# Patient Record
Sex: Female | Born: 1950 | Race: White | Hispanic: No | Marital: Single | Smoking: Never smoker
Health system: Southern US, Community
[De-identification: ages and names within clinical notes are randomized; demographics above are authoritative.]

## PROBLEM LIST (undated history)

## (undated) DIAGNOSIS — E039 Hypothyroidism, unspecified: Secondary | ICD-10-CM

## (undated) DIAGNOSIS — G5600 Carpal tunnel syndrome, unspecified upper limb: Secondary | ICD-10-CM

## (undated) DIAGNOSIS — K219 Gastro-esophageal reflux disease without esophagitis: Secondary | ICD-10-CM

## (undated) DIAGNOSIS — H9190 Unspecified hearing loss, unspecified ear: Secondary | ICD-10-CM

## (undated) DIAGNOSIS — I8390 Asymptomatic varicose veins of unspecified lower extremity: Secondary | ICD-10-CM

## (undated) DIAGNOSIS — J189 Pneumonia, unspecified organism: Secondary | ICD-10-CM

## (undated) DIAGNOSIS — M1990 Unspecified osteoarthritis, unspecified site: Secondary | ICD-10-CM

## (undated) HISTORY — PX: FOOT SURGERY: SHX648

## (undated) HISTORY — PX: BACK SURGERY: SHX140

## (undated) HISTORY — PX: TONSILLECTOMY: SUR1361

## (undated) HISTORY — PX: APPENDECTOMY: SHX54

## (undated) HISTORY — PX: SPINAL FUSION: SHX223

## (undated) HISTORY — DX: Gastro-esophageal reflux disease without esophagitis: K21.9

## (undated) HISTORY — PX: COLON SURGERY: SHX602

## (undated) HISTORY — PX: KNEE ARTHROSCOPY: SUR90

## (undated) HISTORY — PX: ABDOMINAL HYSTERECTOMY: SHX81

## (undated) HISTORY — PX: OTHER SURGICAL HISTORY: SHX169

## (undated) HISTORY — DX: Hypothyroidism, unspecified: E03.9

---

## 2000-11-30 ENCOUNTER — Other Ambulatory Visit: Admission: RE | Admit: 2000-11-30 | Discharge: 2000-11-30 | Payer: Self-pay | Admitting: *Deleted

## 2003-03-03 ENCOUNTER — Ambulatory Visit (HOSPITAL_COMMUNITY): Admission: RE | Admit: 2003-03-03 | Discharge: 2003-03-03 | Payer: Self-pay | Admitting: Gastroenterology

## 2003-03-03 ENCOUNTER — Encounter (INDEPENDENT_AMBULATORY_CARE_PROVIDER_SITE_OTHER): Payer: Self-pay | Admitting: Specialist

## 2003-07-01 ENCOUNTER — Encounter: Admission: RE | Admit: 2003-07-01 | Discharge: 2003-07-01 | Payer: Self-pay | Admitting: Internal Medicine

## 2004-03-04 ENCOUNTER — Encounter: Admission: RE | Admit: 2004-03-04 | Discharge: 2004-03-04 | Payer: Self-pay | Admitting: Orthopedic Surgery

## 2004-07-29 ENCOUNTER — Inpatient Hospital Stay (HOSPITAL_COMMUNITY): Admission: EM | Admit: 2004-07-29 | Discharge: 2004-07-30 | Payer: Self-pay | Admitting: Emergency Medicine

## 2008-10-20 ENCOUNTER — Encounter: Admission: RE | Admit: 2008-10-20 | Discharge: 2008-10-20 | Payer: Self-pay | Admitting: Otolaryngology

## 2010-06-04 NOTE — Op Note (Signed)
NAME:  [name], [name]                      ACCOUNT NO.:  [id number]   MEDICAL RECORD NO.:  [id number]                   PATIENT TYPE:  AMB   LOCATION:  ENDO                                 FACILITY:  [facility]   PHYSICIAN:  Danise Edge, M.D.                DATE OF BIRTH:  10-10-1950   DATE OF PROCEDURE:  03/03/2003  DATE OF DISCHARGE:                                 OPERATIVE REPORT   PROCEDURE PERFORMED:  Colonoscopy.   ENDOSCOPIST:  Charolett Bumpers, M.D.   INDICATIONS FOR PROCEDURE:  [name] is a 60 year old female born  Sep 24, 1950.  [name] is scheduled to undergo a screening  colonoscopy with polypectomy to present colon cancer.  Approximately 12  years ago while residing in [place], [name] underwent surgery for  suspected Crohn's disease.  Her appendix and part of her colon were  resected.  She has had no symptoms to suggest Crohn's disease subsequently.   PREMEDICATION:  Versed 10 mg, Demerol 50 mg.   INSTRUMENT USED:  Pediatric Olympus video colonoscope.   DESCRIPTION OF PROCEDURE:  After obtaining informed consent, [name]  was placed in the left lateral decubitus position.  I administered  intravenous Demerol and intravenous Versed to achieve conscious sedation for  the procedure.  The patient's blood pressure, oxygen saturations and cardiac  rhythm were monitored throughout the procedure and documented in the medical  record.   Anal inspection was normal.  Digital rectal exam was normal.  The pediatric  adjustable Olympus video colonoscope was introduced into the rectum and  advanced to the end-to-end ileobicolonic surgical anastomosis.  Colonic  preparation for the exam today was excellent.   Rectum:  Normal.   Sigmoid colon and descending colon:  Normal.   Splenic flexure:  Normal.   Transverse colon:  Normal.   Hepatic flexure:  Normal.   Ascending colon:  Normal.   End-to-end ileo right colonic surgical  anastomosis:  On the colonic side of  the anastomosis there are scattered superficial linear ulcers with friable  appearing mucosa.  Visible suture is present associated with the  anastomosis.  Colonic biopsies were performed.  The anastomosis was scarred  to the point that I was unable to advance the pediatric colonoscope into the  distal ileum.  There appeared to be a small ulcer in the distal ileum which  I could see peering through the anastomosis.  Ileal biopsies were also  performed.   ASSESSMENT:  1. Normal screening proctocolonoscopy to the end-to-end surgical ileo right     colonic surgical anastomosis. No endoscopic evidence for the presence of     colorectal neoplasia.  2. The ileo right colon surgical anastomosis appeared inflamed on both the     colonic side and ileal sides of the anastomosis.  Colonic  biopsies were     performed and distal ileal biopsies were performed.   PLAN:  Danise Edge, M.D.    MJ/MEDQ  D:  03/03/2003  T:  03/03/2003  Job:  [id number]

## 2010-06-04 NOTE — H&P (Signed)
[name], [name]            ACCOUNT NO.:  [id number]   MEDICAL RECORD NO.:  [id number]          PATIENT TYPE:  INP   LOCATION:  6703                         FACILITY:  [facility]   PHYSICIAN:  Georgann Housekeeper, MD      DATE OF BIRTH:  12-May-1950   DATE OF ADMISSION:  07/28/2004  DATE OF DISCHARGE:                                HISTORY & PHYSICAL   HISTORY OF PRESENT ILLNESS:  The patient is a 60 year old white female with  past medical history significant for hypothyroidism. The patient presented  to the emergency room with abdominal pain x1 day after a meal, with nausea,  vomiting. The pain was mostly periumbilical. She had no fever, she had some  chills. The patient stated that she had a colonic obstruction 12 years ago  resulting in partial colectomy and appendectomy. Since then no problems. No  history of prior abdominal surgery except for an abdominal hysterectomy 8  years ago.   PAST MEDICAL HISTORY:  1.  Significant for hypothyroidism and question of thyroid nodule in March,      2006.  2.  Total abdominal hysterectomy.  3.  Hot flashes.  4.  Partial colectomy 12 years ago.  5.  In February, 2005 she had a colonoscopy that showed inflamed right      colonic anastomosis at either end of the anastomotic site, done by Dr.      Laural Benes.   FAMILY HISTORY:  Mother with CHF, asthma, 62 years old. Father with coronary  artery disease and MI at 64 years of age. Three sisters, two brothers are  healthy.   SOCIAL HISTORY:  She does not smoke. She drinks socially. She owns her own  business. She has three children.   MEDICATIONS:  1.  Synthroid.  2.  Premarin.  3.  Multivitamin.   ALLERGIES:  No known drug allergies.   REVIEW OF SYSTEMS:  No chest pain, no shortness of breath. Positive for  abdominal pain. No dysuria. Right hand pain. No recent constipation.   PHYSICAL EXAMINATION:  VITAL SIGNS:  Temperature 98, pulse 83, respiration  20, blood pressure 106/64.  HEENT:   Normocephalic, atraumatic. Pupils are equal, round and reactive to  light. Throat without erythema.  CARDIOVASCULAR:  Regular rate and rhythm, no murmurs, rubs, or gallops.  LUNGS:  Clear to auscultation bilaterally. No wheezes, rhonchi or rales.  ABDOMEN:  Soft with some mild periumbilical tenderness. Positive bowel  sounds.  EXTREMITIES:  Without edema.  RECTAL EXAM:  She had good rectal tone. No stool in the rectal vault.   LABORATORY DATA:  CT scan showed a question of partial small bowel  obstruction, abnormal thick wall loops of small bowel distally, question of  ischemia. White blood cell count 5700, hemoglobin 12.3, platelets 211,000.  Sodium 130, potassium 3.4, chloride 112, CO2 22, BUN 5, creatinine 0.8,  glucose 103. LDH 122, lipase 20, PT 24, PTT 12.8, INR 0.1. Urinalysis  negative.   ASSESSMENT/PLAN:  Question of partial small bowel obstruction. The patient  has a prior history of colonic obstruction, had surgery. She has no history  of recent constipation. Her abdominal  exam is presently benign. She does not  have any abdominal pain. Some mild tenderness in the periumbilical area.  Based on her physical exam I doubt she has severe obstruction at present, or  that she has ischemia, but will give her IV fluids, make her n.p.o. except  for ice chips, and continue to monitor. Will defer to Dr. Georgann Housekeeper to  be seen in the morning to see if he wants to get a surgical consult. Will do  a soapsuds enema today although I could not really feel any form of  obstruction in the rectal vault.   1.  Hypokalemia. A repeat potassium and check her magnesium.   1.  Hypothyroidism. Will check TSH and hold Synthroid for now.       NJ/MEDQ  D:  07/29/2004  T:  07/29/2004  Job:  [id number]

## 2010-06-04 NOTE — Consult Note (Signed)
[name], [name]            ACCOUNT NO.:  [id number]   MEDICAL RECORD NO.:  [id number]          PATIENT TYPE:  INP   LOCATION:  6703                         FACILITY:  [facility]   PHYSICIAN:  James L. Malon Kindle., M.D.DATE OF BIRTH:  1950-12-05   DATE OF CONSULTATION:  07/29/2004  DATE OF DISCHARGE:                                   CONSULTATION   REFERRING PHYSICIAN:  Dr. Georgann Housekeeper   HISTORY:  [name] is a very nice 60 year old woman whom has a history  of what was felt to be Crohn's disease back in [place] 12 years ago.  She  at that time had a partial colectomy and appendectomy and small bowel  resection according to her and was told that this was not Crohn's disease  after all.  She has done well since then with no chronic diarrhea, no other  problems.  She had a colonoscopy by Dr. Danise Edge last year and the  patient was found to have an end-to-end ileocolonic anastomosis.  On the  colonic side were superficial linear ulcers with friable mucosa.  This was  biopsied and the biopsies came back showing chronic active inflammation  consistent with inflammatory bowel disease.  The patient has had no symptoms  clearly suggestive of inflammatory bowel disease and does not feel that she  has that.  She came in acutely, was doing well until one day prior to  admission and began to have abdominal pain after a meal with nausea and  vomiting, periumbilical cramping pain.  She had an acute abdominal series  shows a nonspecific bowel gas pattern, ileus versus partial small bowel  obstruction.  CT showed a thickened small bowel consistent with a partial  small bowel obstruction.  They were unable to call anything else other than  that.  There was no clear Crohn's disease on the CT.  Patient clinically  feels better, although she says her abdomen feels a little touchy.   CHRONIC MEDICATIONS:  Synthroid, Premarin, vitamins.   She has no drug allergies.   MEDICAL  HISTORY:  1.  Hypothyroidism.  2.  History of right colectomy and ileal resection 12 years ago with      subsequent colonoscopy showing inflammation, the questionable      inflammatory bowel disease unanswered.  3.  Status post total abdominal hysterectomy.   FAMILY HISTORY:  Heart failure, asthma, heart disease.  No inflammatory  bowel disease.   REVIEW OF SYSTEMS:  Remarkable for lack of diarrhea or abdominal pain on a  chronic basis.   PHYSICAL EXAMINATION:  VITAL SIGNS:  Patient is afebrile.  Vital signs  normal.  GENERAL:  Pleasant white female.  She is in no obvious distress.  HEENT:  Non-icteric.  Extraocular movements intact.  NECK:  Supple.  No lymphadenopathy.  LUNGS:  Clear.  HEART:  Regular rate and rhythm without murmurs or gallops.  ABDOMEN:  Soft, nontender, nondistended with good bowel sounds.  RECTAL:  There was no stool in the rectal vault on physical examination.   ASSESSMENT:  Abnormal CT suggestive of partial small bowel obstruction.  This could  be adhesions or the patient could have Crohn's disease.  I think  going ahead with a small bowel series would certainly be appropriate.   PLAN:  Will start her on clear liquids and move ahead with a small bowel  series and have her follow up with Dr. Laural Benes.       JLE/MEDQ  D:  07/29/2004  T:  07/29/2004  Job:  [id number]   cc:   Danise Edge, M.D.  [address]  [place]  [place] [address]  Fax: [phone]

## 2010-06-04 NOTE — Discharge Summary (Signed)
[name], [name]            ACCOUNT NO.:  [id number]   MEDICAL RECORD NO.:  [id number]          PATIENT TYPE:  INP   LOCATION:  6703                         FACILITY:  [facility]   PHYSICIAN:  Georgann Housekeeper, MD      DATE OF BIRTH:  03-04-1950   DATE OF ADMISSION:  07/28/2004  DATE OF DISCHARGE:  07/30/2004                                 DISCHARGE SUMMARY   DISCHARGE MEDICATIONS:  Synthroid, Premarin, multivitamin.   DIET:  Bland diet.   FOLLOWUP:  Follow up with me in the office in one week.   CONSULTATIONS:  GI.   IMAGING PROCEDURES:  1.  Computerized tomography of abdomen and pelvis which showed some      questionable thickening of the small bowel area and partial small bowel      obstruction.  2.  Upper GI small bowel follow-through showed some narrowing at the      enterocolonic anastomosis area and partial small bowel obstruction.   LABORATORIES:  Normal white count.  Chemistries were normal.  LFTs were  normal.  Urine negative.  Thyroid was normal.   HOSPITAL COURSE:  Please see H&P for details.  A 60 year old female with a  history of hypothyroidism and had a remote history of a partial colectomy  for removal of the appendix and part of the terminal ileum, came in with  abdominal pain and nausea and vomiting consistent with partial small bowel  obstruction.  The patient was admitted, made n.p.o. and IV fluids were  started.  The patient was hemodynamically stable.  Lab work was all normal.  CT scan was obtained and showed some questionable thickening of the terminal  ileum and was thought to be around the anastomosis area, was all part of the  small bowel obstruction and thickening.  She did well with the n.p.o. and  __________ management, started on clear liquids and then advanced her diet,  tolerated it well without any problems.  Because of the CT findings, she had  an upper GI with small bowel follow-through per the GI.  It showed there was  some narrowing around  the area of the terminal ileum at the anastomosis  site, questionable mild ileitis.  The patient had had colonoscopy prior to  the admission by Dr. Laural Benes.  Because of the findings thought to be  questionable inflammatory bowel disease, but it was thought secondary to her  small bowel obstruction.  She had no abdominal pain, tolerated the diet  well, no bloody diarrhea, no fevers.  The patient is to follow up as an  outpatient.  She is to be continued on her home medications.   CONDITION ON DISCHARGE:  Stable.   FOLLOWUP:  Followup in one week.      Georgann Housekeeper, MD  Electronically Signed     KH/MEDQ  D:  09/24/2004  T:  09/24/2004  Job:  [id number]

## 2014-09-26 DIAGNOSIS — J069 Acute upper respiratory infection, unspecified: Secondary | ICD-10-CM | POA: Diagnosis not present

## 2014-10-03 DIAGNOSIS — J45909 Unspecified asthma, uncomplicated: Secondary | ICD-10-CM | POA: Diagnosis not present

## 2014-10-09 DIAGNOSIS — Z23 Encounter for immunization: Secondary | ICD-10-CM | POA: Diagnosis not present

## 2014-10-09 DIAGNOSIS — Z0001 Encounter for general adult medical examination with abnormal findings: Secondary | ICD-10-CM | POA: Diagnosis not present

## 2014-10-09 DIAGNOSIS — R739 Hyperglycemia, unspecified: Secondary | ICD-10-CM | POA: Diagnosis not present

## 2014-10-09 DIAGNOSIS — E785 Hyperlipidemia, unspecified: Secondary | ICD-10-CM | POA: Diagnosis not present

## 2014-10-09 DIAGNOSIS — R233 Spontaneous ecchymoses: Secondary | ICD-10-CM | POA: Diagnosis not present

## 2014-10-09 DIAGNOSIS — Z6835 Body mass index (BMI) 35.0-35.9, adult: Secondary | ICD-10-CM | POA: Diagnosis not present

## 2014-10-09 DIAGNOSIS — Z780 Asymptomatic menopausal state: Secondary | ICD-10-CM | POA: Diagnosis not present

## 2014-10-09 DIAGNOSIS — R21 Rash and other nonspecific skin eruption: Secondary | ICD-10-CM | POA: Diagnosis not present

## 2014-10-09 DIAGNOSIS — K219 Gastro-esophageal reflux disease without esophagitis: Secondary | ICD-10-CM | POA: Diagnosis not present

## 2014-10-09 DIAGNOSIS — E039 Hypothyroidism, unspecified: Secondary | ICD-10-CM | POA: Diagnosis not present

## 2014-10-09 DIAGNOSIS — J209 Acute bronchitis, unspecified: Secondary | ICD-10-CM | POA: Diagnosis not present

## 2014-10-09 DIAGNOSIS — Z79899 Other long term (current) drug therapy: Secondary | ICD-10-CM | POA: Diagnosis not present

## 2014-10-31 DIAGNOSIS — M1711 Unilateral primary osteoarthritis, right knee: Secondary | ICD-10-CM | POA: Diagnosis not present

## 2014-12-15 DIAGNOSIS — Z1231 Encounter for screening mammogram for malignant neoplasm of breast: Secondary | ICD-10-CM | POA: Diagnosis not present

## 2014-12-25 DIAGNOSIS — H900 Conductive hearing loss, bilateral: Secondary | ICD-10-CM | POA: Diagnosis not present

## 2014-12-25 DIAGNOSIS — Z9622 Myringotomy tube(s) status: Secondary | ICD-10-CM | POA: Diagnosis not present

## 2014-12-25 DIAGNOSIS — Z8669 Personal history of other diseases of the nervous system and sense organs: Secondary | ICD-10-CM | POA: Diagnosis not present

## 2014-12-25 DIAGNOSIS — H7201 Central perforation of tympanic membrane, right ear: Secondary | ICD-10-CM | POA: Diagnosis not present

## 2014-12-25 DIAGNOSIS — H9072 Mixed conductive and sensorineural hearing loss, unilateral, left ear, with unrestricted hearing on the contralateral side: Secondary | ICD-10-CM | POA: Diagnosis not present

## 2015-02-11 DIAGNOSIS — H40013 Open angle with borderline findings, low risk, bilateral: Secondary | ICD-10-CM | POA: Diagnosis not present

## 2015-02-11 DIAGNOSIS — H40053 Ocular hypertension, bilateral: Secondary | ICD-10-CM | POA: Diagnosis not present

## 2015-02-13 DIAGNOSIS — H7292 Unspecified perforation of tympanic membrane, left ear: Secondary | ICD-10-CM | POA: Diagnosis not present

## 2015-02-13 DIAGNOSIS — Z79899 Other long term (current) drug therapy: Secondary | ICD-10-CM | POA: Diagnosis not present

## 2015-02-13 DIAGNOSIS — H9012 Conductive hearing loss, unilateral, left ear, with unrestricted hearing on the contralateral side: Secondary | ICD-10-CM | POA: Diagnosis not present

## 2015-03-18 DIAGNOSIS — Z780 Asymptomatic menopausal state: Secondary | ICD-10-CM | POA: Diagnosis not present

## 2015-04-01 DIAGNOSIS — Z124 Encounter for screening for malignant neoplasm of cervix: Secondary | ICD-10-CM | POA: Diagnosis not present

## 2015-07-14 DIAGNOSIS — M1711 Unilateral primary osteoarthritis, right knee: Secondary | ICD-10-CM | POA: Diagnosis not present

## 2015-07-14 DIAGNOSIS — M25512 Pain in left shoulder: Secondary | ICD-10-CM | POA: Diagnosis not present

## 2015-07-15 DIAGNOSIS — M2011 Hallux valgus (acquired), right foot: Secondary | ICD-10-CM | POA: Diagnosis not present

## 2015-07-20 DIAGNOSIS — M1711 Unilateral primary osteoarthritis, right knee: Secondary | ICD-10-CM | POA: Diagnosis not present

## 2015-08-17 ENCOUNTER — Ambulatory Visit
Admission: RE | Admit: 2015-08-17 | Discharge: 2015-08-17 | Disposition: A | Discharge status: Home / Self Care | Payer: Medicare Other | Source: Ambulatory Visit | Attending: Nurse Practitioner | Admitting: Nurse Practitioner

## 2015-08-17 ENCOUNTER — Other Ambulatory Visit: Payer: Self-pay | Admitting: Nurse Practitioner

## 2015-08-17 DIAGNOSIS — M25561 Pain in right knee: Secondary | ICD-10-CM

## 2015-08-17 DIAGNOSIS — W19XXXA Unspecified fall, initial encounter: Secondary | ICD-10-CM | POA: Diagnosis not present

## 2015-08-17 DIAGNOSIS — T1490XA Injury, unspecified, initial encounter: Secondary | ICD-10-CM

## 2015-08-17 DIAGNOSIS — S8001XA Contusion of right knee, initial encounter: Secondary | ICD-10-CM | POA: Diagnosis not present

## 2015-08-18 DIAGNOSIS — R635 Abnormal weight gain: Secondary | ICD-10-CM | POA: Diagnosis not present

## 2015-08-18 DIAGNOSIS — N951 Menopausal and female climacteric states: Secondary | ICD-10-CM | POA: Diagnosis not present

## 2015-09-29 DIAGNOSIS — H524 Presbyopia: Secondary | ICD-10-CM | POA: Diagnosis not present

## 2015-09-29 DIAGNOSIS — H5203 Hypermetropia, bilateral: Secondary | ICD-10-CM | POA: Diagnosis not present

## 2015-09-29 DIAGNOSIS — H40053 Ocular hypertension, bilateral: Secondary | ICD-10-CM | POA: Diagnosis not present

## 2015-09-29 DIAGNOSIS — H52221 Regular astigmatism, right eye: Secondary | ICD-10-CM | POA: Diagnosis not present

## 2015-09-29 DIAGNOSIS — H2513 Age-related nuclear cataract, bilateral: Secondary | ICD-10-CM | POA: Diagnosis not present

## 2015-10-27 DIAGNOSIS — M21611 Bunion of right foot: Secondary | ICD-10-CM | POA: Diagnosis not present

## 2015-10-27 DIAGNOSIS — M2061 Acquired deformities of toe(s), unspecified, right foot: Secondary | ICD-10-CM | POA: Diagnosis not present

## 2015-10-27 DIAGNOSIS — M205X1 Other deformities of toe(s) (acquired), right foot: Secondary | ICD-10-CM | POA: Diagnosis not present

## 2015-10-27 DIAGNOSIS — M2011 Hallux valgus (acquired), right foot: Secondary | ICD-10-CM | POA: Diagnosis not present

## 2015-11-03 ENCOUNTER — Inpatient Hospital Stay (INDEPENDENT_AMBULATORY_CARE_PROVIDER_SITE_OTHER): Payer: Medicare Other | Admitting: Orthopedic Surgery

## 2015-11-03 DIAGNOSIS — M1711 Unilateral primary osteoarthritis, right knee: Secondary | ICD-10-CM

## 2015-11-03 DIAGNOSIS — M2041 Other hammer toe(s) (acquired), right foot: Secondary | ICD-10-CM

## 2015-11-03 DIAGNOSIS — M2011 Hallux valgus (acquired), right foot: Secondary | ICD-10-CM

## 2015-11-03 DIAGNOSIS — M2042 Other hammer toe(s) (acquired), left foot: Secondary | ICD-10-CM

## 2015-11-03 DIAGNOSIS — M2022 Hallux rigidus, left foot: Secondary | ICD-10-CM

## 2015-11-03 DIAGNOSIS — M25512 Pain in left shoulder: Secondary | ICD-10-CM

## 2015-11-11 ENCOUNTER — Ambulatory Visit (INDEPENDENT_AMBULATORY_CARE_PROVIDER_SITE_OTHER): Payer: Medicare Other | Admitting: Family

## 2015-11-11 ENCOUNTER — Encounter (INDEPENDENT_AMBULATORY_CARE_PROVIDER_SITE_OTHER): Payer: Self-pay | Admitting: Family

## 2015-11-11 VITALS — Ht 62.5 in | Wt 192.0 lb

## 2015-11-11 DIAGNOSIS — Z9889 Other specified postprocedural states: Secondary | ICD-10-CM

## 2015-11-11 DIAGNOSIS — M205X1 Other deformities of toe(s) (acquired), right foot: Secondary | ICD-10-CM

## 2015-11-11 NOTE — Progress Notes (Signed)
   Post-Op Visit Note   Patient: [name]           Date of Birth: 10-29-1950           MRN: [id number] Visit Date: 11/11/2015 PCP: [facility]   Assessment & Plan:  Chief Complaint:  Chief Complaint  Patient presents with  . Right Foot - Routine Post Op   Visit Diagnoses: No diagnosis found.   Exam: Incisions well approximated with sutures. No drainage, erythema or sign of infection. Moderate swelling.   Plan: Will wear compression stocking daily. Sutures and pin removed today. Dry dressing applied. May touchdown weight bear in post op shoe. Continue daily wound care.   Follow-Up Instructions: Return in about 2 weeks (around 11/25/2015), or post op check.   Orders:  No orders of the defined types were placed in this encounter.  Meds ordered this encounter  Medications  . aspirin 81 MG chewable tablet    Sig: Chew 81 mg by mouth daily.     PMFS History: There are no active problems to display for this patient.  Past Medical History:  Diagnosis Date  . GERD (gastroesophageal reflux disease)   . Hypothyroid     History reviewed. No pertinent family history.  No past surgical history on file. Social History   Occupational History  . Not on file.   Social History Main Topics  . Smoking status: Never Smoker  . Smokeless tobacco: Not on file  . Alcohol use 0.6 oz/week    1 Glasses of wine per week     Comment: daily  . Drug use: Unknown  . Sexual activity: Not on file

## 2015-11-25 ENCOUNTER — Ambulatory Visit (INDEPENDENT_AMBULATORY_CARE_PROVIDER_SITE_OTHER): Payer: Medicare Other | Admitting: Family

## 2015-11-25 ENCOUNTER — Encounter (INDEPENDENT_AMBULATORY_CARE_PROVIDER_SITE_OTHER): Payer: Self-pay | Admitting: Family

## 2015-11-25 VITALS — Ht 62.0 in | Wt 192.0 lb

## 2015-11-25 DIAGNOSIS — M21611 Bunion of right foot: Secondary | ICD-10-CM

## 2015-11-25 DIAGNOSIS — M205X1 Other deformities of toe(s) (acquired), right foot: Secondary | ICD-10-CM

## 2015-11-25 NOTE — Progress Notes (Signed)
   Office Visit Note   Patient: [name]           Date of Birth: 1950-07-17           MRN: [id number] Visit Date: 11/25/2015              Requested by: [facility] [place], [place] [address] PCP: [facility]   Assessment & Plan: Visit Diagnoses:  1. Bunion, right foot   2. Acquired claw toe of right foot     Plan: Continue scar massage. May shower. May resume weight bearing in regular shoe wear. Advance activities as tolerated.  Follow-Up Instructions: Return if symptoms worsen or fail to improve.   Orders:  No orders of the defined types were placed in this encounter.  No orders of the defined types were placed in this encounter.     Procedures: No procedures performed   Clinical Data: No additional findings.   Subjective: Chief Complaint  Patient presents with  . Right Foot - Routine Post Op    10/27/15 right great aiken osteotomy weil 2nd 3rd and 4th    4 weeks s/p a right great toe Aiken osteotomy and weil 2nd, 3rd and 4th MT pt is full  Weight bearing in a post op shoe with  No questions or concerns. She does massage the area with vitamin E    Review of Systems  Constitutional: Negative for chills and fever.  All other systems reviewed and are negative.    Objective: Vital Signs: Ht 5\' 2"  (1.575 m)   Wt 192 lb (87.1 kg)   BMI 35.12 kg/m   Physical Exam  Ortho Exam  Well-healed. No keloiding. Good range of motion. Moderate swelling right foot. No pain. does have a steady gait.  Specialty Comments:  No specialty comments available.  Imaging: No results found.   PMFS History: There are no active problems to display for this patient.  Past Medical History:  Diagnosis Date  . GERD (gastroesophageal reflux disease)   . Hypothyroid     History reviewed. No pertinent family history.  History reviewed. No pertinent surgical history. Social History   Occupational History    . Not on file.   Social History Main Topics  . Smoking status: Never Smoker  . Smokeless tobacco: Not on file  . Alcohol use 0.6 oz/week    1 Glasses of wine per week     Comment: daily  . Drug use: Unknown  . Sexual activity: Not on file

## 2016-03-10 DIAGNOSIS — K219 Gastro-esophageal reflux disease without esophagitis: Secondary | ICD-10-CM | POA: Diagnosis not present

## 2016-03-10 DIAGNOSIS — H7291 Unspecified perforation of tympanic membrane, right ear: Secondary | ICD-10-CM | POA: Diagnosis not present

## 2016-03-10 DIAGNOSIS — J040 Acute laryngitis: Secondary | ICD-10-CM | POA: Diagnosis not present

## 2016-04-28 ENCOUNTER — Ambulatory Visit (INDEPENDENT_AMBULATORY_CARE_PROVIDER_SITE_OTHER): Payer: Medicare Other | Admitting: Orthopedic Surgery

## 2016-04-28 ENCOUNTER — Encounter (INDEPENDENT_AMBULATORY_CARE_PROVIDER_SITE_OTHER): Payer: Self-pay | Admitting: Orthopedic Surgery

## 2016-04-28 DIAGNOSIS — G8929 Other chronic pain: Secondary | ICD-10-CM

## 2016-04-28 DIAGNOSIS — M25561 Pain in right knee: Secondary | ICD-10-CM

## 2016-04-28 MED ORDER — LIDOCAINE HCL 1 % IJ SOLN
5.0000 mL | INTRAMUSCULAR | Status: AC | PRN
  Administered 2016-04-28: 5 mL

## 2016-04-28 MED ORDER — METHYLPREDNISOLONE ACETATE 40 MG/ML IJ SUSP
40.0000 mg | INTRAMUSCULAR | Status: AC | PRN
  Administered 2016-04-28: 40 mg via INTRA_ARTICULAR

## 2016-04-28 MED ORDER — BUPIVACAINE HCL 0.25 % IJ SOLN
4.0000 mL | INTRAMUSCULAR | Status: AC | PRN
  Administered 2016-04-28: 4 mL via INTRA_ARTICULAR

## 2016-04-28 NOTE — Progress Notes (Signed)
Office Visit Note   Patient: [name]           Date of Birth: 1950/04/03           MRN: [id number] Visit Date: 04/28/2016 Requested by: [facility] [place], [place] [address] PCP: [facility]  Subjective: Chief Complaint  Patient presents with  . Right Knee - Pain    HPI: The is a 66 year old female with right knee arthritis.  Last knee injection performed July 2017.  She is getting ready to go to [facility] 2 beers her grandchildren.  She's having some recurrent symptoms.  Denies any frank mechanical symptoms but just reports pain.  She has been taking some over-the-counter medication.              ROS: All systems reviewed are negative as they relate to the chief complaint within the history of present illness.  Patient denies  fevers or chills.   Assessment & Plan: Visit Diagnoses:  1. Chronic pain of right knee     Plan: Impression is right knee exacerbation of arthritis plan is injection today.  Need for nonweightbearing quad strengthening exercises encouraged.  I'll see her back as needed  Follow-Up Instructions: Return if symptoms worsen or fail to improve.   Orders:  No orders of the defined types were placed in this encounter.  No orders of the defined types were placed in this encounter.     Procedures: Large Joint Inj Date/Time: 04/28/2016 10:36 PM Performed by: Meredith Pel Authorized by: Meredith Pel   Consent Given by:  Patient Site marked: the procedure site was marked   Timeout: prior to procedure the correct patient, procedure, and site was verified   Indications:  Pain, joint swelling and diagnostic evaluation Location:  Knee Site:  R knee Prep: patient was prepped and draped in usual sterile fashion   Needle Size:  18 G Needle Length:  1.5 inches Approach:  Superolateral Ultrasound Guidance: No   Fluoroscopic Guidance: No   Arthrogram: No   Medications:  5 mL  lidocaine 1 %; 4 mL bupivacaine 0.25 %; 40 mg methylPREDNISolone acetate 40 MG/ML Patient tolerance:  Patient tolerated the procedure well with no immediate complications     Clinical Data: No additional findings.  Objective: Vital Signs: There were no vitals taken for this visit.  Physical Exam:   Constitutional: Patient appears well-developed HEENT:  Head: Normocephalic Eyes:EOM are normal Neck: Normal range of motion Cardiovascular: Normal rate Pulmonary/chest: Effort normal Neurologic: Patient is alert Skin: Skin is warm Psychiatric: Patient has normal mood and affect    Ortho Exam: Orthopedic exam demonstrates pretty normal gait alignment no assistive devices palpable pedal pulses no effusion in the right knee there is some patellofemoral crepitus but stable collateral crucial ligaments intact extensor mechanism.  No other masses lymph adenopathy or skin changes noted in the right knee region  Specialty Comments:  No specialty comments available.  Imaging: No results found.   PMFS History: There are no active problems to display for this patient.  Past Medical History:  Diagnosis Date  . GERD (gastroesophageal reflux disease)   . Hypothyroid     No family history on file.  No past surgical history on file. Social History   Occupational History  . Not on file.   Social History Main Topics  . Smoking status: Never Smoker  . Smokeless tobacco: Not on file  . Alcohol  use 0.6 oz/week    1 Glasses of wine per week     Comment: daily  . Drug use: Unknown  . Sexual activity: Not on file

## 2016-04-29 DIAGNOSIS — E039 Hypothyroidism, unspecified: Secondary | ICD-10-CM | POA: Diagnosis not present

## 2016-04-29 DIAGNOSIS — E785 Hyperlipidemia, unspecified: Secondary | ICD-10-CM | POA: Diagnosis not present

## 2016-04-29 DIAGNOSIS — J45909 Unspecified asthma, uncomplicated: Secondary | ICD-10-CM | POA: Diagnosis not present

## 2016-07-01 DIAGNOSIS — L239 Allergic contact dermatitis, unspecified cause: Secondary | ICD-10-CM | POA: Diagnosis not present

## 2016-07-01 DIAGNOSIS — Z1389 Encounter for screening for other disorder: Secondary | ICD-10-CM | POA: Diagnosis not present

## 2016-07-01 DIAGNOSIS — H9012 Conductive hearing loss, unilateral, left ear, with unrestricted hearing on the contralateral side: Secondary | ICD-10-CM | POA: Diagnosis not present

## 2016-07-01 DIAGNOSIS — Z1211 Encounter for screening for malignant neoplasm of colon: Secondary | ICD-10-CM | POA: Diagnosis not present

## 2016-07-01 DIAGNOSIS — Z1231 Encounter for screening mammogram for malignant neoplasm of breast: Secondary | ICD-10-CM | POA: Diagnosis not present

## 2016-07-01 DIAGNOSIS — Z79899 Other long term (current) drug therapy: Secondary | ICD-10-CM | POA: Diagnosis not present

## 2016-07-01 DIAGNOSIS — H7102 Cholesteatoma of attic, left ear: Secondary | ICD-10-CM | POA: Diagnosis not present

## 2016-07-01 DIAGNOSIS — Z6834 Body mass index (BMI) 34.0-34.9, adult: Secondary | ICD-10-CM | POA: Diagnosis not present

## 2016-07-01 DIAGNOSIS — E039 Hypothyroidism, unspecified: Secondary | ICD-10-CM | POA: Diagnosis not present

## 2016-07-01 DIAGNOSIS — Z0000 Encounter for general adult medical examination without abnormal findings: Secondary | ICD-10-CM | POA: Diagnosis not present

## 2016-07-01 DIAGNOSIS — J45909 Unspecified asthma, uncomplicated: Secondary | ICD-10-CM | POA: Diagnosis not present

## 2016-07-01 DIAGNOSIS — Z23 Encounter for immunization: Secondary | ICD-10-CM | POA: Diagnosis not present

## 2016-07-01 DIAGNOSIS — E785 Hyperlipidemia, unspecified: Secondary | ICD-10-CM | POA: Diagnosis not present

## 2016-09-28 DIAGNOSIS — Z1211 Encounter for screening for malignant neoplasm of colon: Secondary | ICD-10-CM | POA: Diagnosis not present

## 2016-10-03 DIAGNOSIS — Z1211 Encounter for screening for malignant neoplasm of colon: Secondary | ICD-10-CM | POA: Diagnosis not present

## 2016-10-18 DIAGNOSIS — H40053 Ocular hypertension, bilateral: Secondary | ICD-10-CM | POA: Diagnosis not present

## 2016-10-18 DIAGNOSIS — H5203 Hypermetropia, bilateral: Secondary | ICD-10-CM | POA: Diagnosis not present

## 2016-10-18 DIAGNOSIS — H524 Presbyopia: Secondary | ICD-10-CM | POA: Diagnosis not present

## 2016-10-18 DIAGNOSIS — H52223 Regular astigmatism, bilateral: Secondary | ICD-10-CM | POA: Diagnosis not present

## 2016-11-02 DIAGNOSIS — M545 Low back pain: Secondary | ICD-10-CM | POA: Diagnosis not present

## 2016-11-02 DIAGNOSIS — M5136 Other intervertebral disc degeneration, lumbar region: Secondary | ICD-10-CM | POA: Diagnosis not present

## 2016-11-02 DIAGNOSIS — M4316 Spondylolisthesis, lumbar region: Secondary | ICD-10-CM | POA: Diagnosis not present

## 2016-11-03 DIAGNOSIS — Z23 Encounter for immunization: Secondary | ICD-10-CM | POA: Diagnosis not present

## 2016-11-04 DIAGNOSIS — H90A21 Sensorineural hearing loss, unilateral, right ear, with restricted hearing on the contralateral side: Secondary | ICD-10-CM | POA: Diagnosis not present

## 2016-11-04 DIAGNOSIS — Z79899 Other long term (current) drug therapy: Secondary | ICD-10-CM | POA: Diagnosis not present

## 2016-11-04 DIAGNOSIS — H90A32 Mixed conductive and sensorineural hearing loss, unilateral, left ear with restricted hearing on the contralateral side: Secondary | ICD-10-CM | POA: Diagnosis not present

## 2016-11-04 DIAGNOSIS — Z9889 Other specified postprocedural states: Secondary | ICD-10-CM | POA: Diagnosis not present

## 2016-11-04 DIAGNOSIS — Z45328 Encounter for adjustment and management of other implanted hearing device: Secondary | ICD-10-CM | POA: Diagnosis not present

## 2016-11-04 DIAGNOSIS — Z7982 Long term (current) use of aspirin: Secondary | ICD-10-CM | POA: Diagnosis not present

## 2016-11-09 ENCOUNTER — Encounter (INDEPENDENT_AMBULATORY_CARE_PROVIDER_SITE_OTHER): Payer: Self-pay | Admitting: Orthopedic Surgery

## 2016-11-09 ENCOUNTER — Ambulatory Visit (INDEPENDENT_AMBULATORY_CARE_PROVIDER_SITE_OTHER): Payer: Medicare Other | Admitting: Orthopedic Surgery

## 2016-11-09 DIAGNOSIS — M545 Low back pain: Secondary | ICD-10-CM | POA: Diagnosis not present
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
## 2016-11-15 ENCOUNTER — Ambulatory Visit
Admission: RE | Admit: 2016-11-15 | Discharge: 2016-11-15 | Disposition: A | Discharge status: Home / Self Care | Payer: Medicare Other | Source: Ambulatory Visit | Attending: Orthopedic Surgery | Admitting: Orthopedic Surgery

## 2016-11-15 DIAGNOSIS — M48061 Spinal stenosis, lumbar region without neurogenic claudication: Secondary | ICD-10-CM | POA: Diagnosis not present

## 2016-11-15 DIAGNOSIS — M545 Low back pain: Secondary | ICD-10-CM

## 2016-11-18 ENCOUNTER — Telehealth (INDEPENDENT_AMBULATORY_CARE_PROVIDER_SITE_OTHER): Payer: Self-pay | Admitting: Orthopedic Surgery

## 2016-11-18 NOTE — Telephone Encounter (Signed)
Ok to rf? IC LM for patient would be Monday before we knew if ok to rf medication.

## 2016-11-18 NOTE — Telephone Encounter (Signed)
Rx refill Hydrocodone 5-325mg 

## 2016-11-19 ENCOUNTER — Other Ambulatory Visit: Payer: Medicare Other

## 2016-11-21 ENCOUNTER — Encounter (INDEPENDENT_AMBULATORY_CARE_PROVIDER_SITE_OTHER): Payer: Self-pay | Admitting: Orthopedic Surgery

## 2016-11-21 ENCOUNTER — Ambulatory Visit (INDEPENDENT_AMBULATORY_CARE_PROVIDER_SITE_OTHER): Payer: Medicare Other | Admitting: Orthopedic Surgery

## 2016-11-21 DIAGNOSIS — M545 Low back pain: Secondary | ICD-10-CM

## 2016-11-21 DIAGNOSIS — G8929 Other chronic pain: Secondary | ICD-10-CM

## 2016-11-21 MED ORDER — HYDROCODONE-ACETAMINOPHEN 5-325 MG PO TABS: ORAL_TABLET | ORAL | 0 refills | Status: DC

## 2016-11-21 MED ORDER — IBUPROFEN 800 MG PO TABS: ORAL_TABLET | ORAL | 0 refills | Status: DC

## 2016-11-21 NOTE — Telephone Encounter (Signed)
IC advised could pick up after 1pm this afternoon.

## 2016-11-21 NOTE — Telephone Encounter (Signed)
y

## 2016-11-22 NOTE — Progress Notes (Signed)
Office Visit Note   Patient: [name]           Date of Birth: 1950/02/12           MRN: [id number] Visit Date: 11/21/2016 Requested by: [facility] [address] [place], [place] [address] PCP: [facility]  Subjective: Chief Complaint  Patient presents with  . Lower Back - Pain    HPI: [name] is a 66 year old patient with low back pain.  Since I have seen her she's had an MRI scan which shows severe stenosis at L4-5 left worse than right.  She also has 5 mm of anterolisthesis.  She's having some vaginal pain as well.  Some days are okay some days are not.  She's taking hydrocodone.  She took 2 axes for 9 days which helped.  She is here with her husband.              ROS: All systems reviewed are negative as they relate to the chief complaint within the history of present illness.  Patient denies  fevers or chills.   Assessment & Plan: Visit Diagnoses:  1. Chronic low back pain, unspecified back pain laterality, with sciatica presence unspecified     Plan: Impression is pretty severe spinal stenosis which I think is likely accounting for some of her vaginal pain as well as leg pain.  Nerve roots are fairly compressed at L4-5.  She wants to try all nonoperative options first.  I think she is heading for surgery.  Ibuprofen prescribed.  Refer to Dr. Ernestina Patches for L-spine ESI.  She should follow up with Dr. Lorin Mercy after that injection for surgical evaluation.  Follow-up with me as needed.  Follow-Up Instructions: No Follow-up on file.   Orders:  Orders Placed This Encounter  Procedures  . Ambulatory referral to Physical Medicine Rehab   Meds ordered this encounter  Medications  . ibuprofen (ADVIL,MOTRIN) 800 MG tablet    Sig: 1 po q d prn pain    Dispense:  60 tablet    Refill:  0      Procedures: No procedures performed   Clinical Data: No additional findings.  Objective: Vital Signs: There were no  vitals taken for this visit.  Physical Exam:   Constitutional: Patient appears well-developed HEENT:  Head: Normocephalic Eyes:EOM are normal Neck: Normal range of motion Cardiovascular: Normal rate Pulmonary/chest: Effort normal Neurologic: Patient is alert Skin: Skin is warm Psychiatric: Patient has normal mood and affect    Ortho Exam: Orthopedic exam demonstrates good ankle dorsiflexion plantar flexion quite hamstring strength no groin pain with internal/external rotation of the leg.  Reflexes symmetric.  No saddle paresthesias present.  No trochanteric tenderness is noted.  Rest of her examination is unchanged from last visit  Specialty Comments:  No specialty comments available.  Imaging: No results found.   PMFS History: There are no active problems to display for this patient.  Past Medical History:  Diagnosis Date  . GERD (gastroesophageal reflux disease)   . Hypothyroid     No family history on file.  No past surgical history on file. Social History   Occupational History  . Not on file  Tobacco Use  . Smoking status: Never Smoker  . Smokeless tobacco: Never Used  Substance and Sexual Activity  . Alcohol use: Yes    Alcohol/week: 0.6 oz    Types: 1 Glasses of wine per week    Comment: daily  .  Drug use: Not on file  . Sexual activity: Not on file

## 2016-11-30 ENCOUNTER — Ambulatory Visit (INDEPENDENT_AMBULATORY_CARE_PROVIDER_SITE_OTHER): Payer: Medicare Other | Admitting: Orthopedic Surgery

## 2016-12-01 ENCOUNTER — Ambulatory Visit (INDEPENDENT_AMBULATORY_CARE_PROVIDER_SITE_OTHER): Payer: Medicare Other | Admitting: Physical Medicine and Rehabilitation

## 2016-12-01 ENCOUNTER — Ambulatory Visit (INDEPENDENT_AMBULATORY_CARE_PROVIDER_SITE_OTHER): Payer: Self-pay

## 2016-12-01 ENCOUNTER — Encounter (INDEPENDENT_AMBULATORY_CARE_PROVIDER_SITE_OTHER): Payer: Self-pay | Admitting: Physical Medicine and Rehabilitation

## 2016-12-01 VITALS — BP 149/90 | HR 77 | Temp 97.9°F

## 2016-12-01 DIAGNOSIS — M4316 Spondylolisthesis, lumbar region: Secondary | ICD-10-CM | POA: Diagnosis not present

## 2016-12-01 DIAGNOSIS — M48062 Spinal stenosis, lumbar region with neurogenic claudication: Secondary | ICD-10-CM

## 2016-12-01 DIAGNOSIS — M5416 Radiculopathy, lumbar region: Secondary | ICD-10-CM

## 2016-12-01 DIAGNOSIS — M533 Sacrococcygeal disorders, not elsewhere classified: Secondary | ICD-10-CM

## 2016-12-01 MED ORDER — LIDOCAINE HCL (PF) 1 % IJ SOLN
2.0000 mL | Freq: Once | INTRAMUSCULAR | Status: AC
  Administered 2016-12-01: 2 mL

## 2016-12-01 MED ORDER — BETAMETHASONE SOD PHOS & ACET 6 (3-3) MG/ML IJ SUSP
12.0000 mg | Freq: Once | INTRAMUSCULAR | Status: AC
  Administered 2016-12-01: 12 mg

## 2016-12-01 NOTE — Progress Notes (Deleted)
Lower back pain. Most pain on left side. Radiating pain to posterior thighs to posterior knees, previous medication. No longer having radiating pain due to medication. Walking, sitting, and laying causes pain with duration.  Has driver. No contrast allergy. No blood thinner, only baby asprin.

## 2016-12-01 NOTE — Patient Instructions (Signed)

## 2016-12-12 ENCOUNTER — Encounter (INDEPENDENT_AMBULATORY_CARE_PROVIDER_SITE_OTHER): Payer: Self-pay | Admitting: Physical Medicine and Rehabilitation

## 2016-12-12 ENCOUNTER — Ambulatory Visit: Payer: Medicare Other | Attending: Physical Medicine and Rehabilitation

## 2016-12-12 DIAGNOSIS — M6281 Muscle weakness (generalized): Secondary | ICD-10-CM | POA: Insufficient documentation

## 2016-12-12 DIAGNOSIS — G8929 Other chronic pain: Secondary | ICD-10-CM | POA: Insufficient documentation

## 2016-12-12 DIAGNOSIS — H90A32 Mixed conductive and sensorineural hearing loss, unilateral, left ear with restricted hearing on the contralateral side: Secondary | ICD-10-CM | POA: Insufficient documentation

## 2016-12-12 DIAGNOSIS — M25652 Stiffness of left hip, not elsewhere classified: Secondary | ICD-10-CM | POA: Diagnosis not present

## 2016-12-12 DIAGNOSIS — M5440 Lumbago with sciatica, unspecified side: Secondary | ICD-10-CM | POA: Insufficient documentation

## 2016-12-12 NOTE — Patient Instructions (Addendum)
Perform all exercises below:  Hold _20___ seconds. Repeat _3___ times.  Do __3__ sessions per day. CAUTION: Movement should be gentle, steady and slow.  Knee to Chest  Lying supine, bend involved knee to chest. Perform with each leg.   Double Knee to Chest (Flexion)   Feet and knees together, arms outstretched, rotate knees left, turning head in opposite direction, until stretch is felt.      HIP: Hamstrings - Short Sitting   Rest leg on raised surface. Keep knee straight. Lift chest.   [facility] [address] [place], [place] [address] Phone # [phone] Fax [phone]

## 2016-12-12 NOTE — Procedures (Signed)
Lumbosacral Transforaminal Epidural Steroid Injection - Sub-Pedicular Approach with Fluoroscopic Guidance  Patient: [name]      Date of Birth: 09/08/1950 MRN: [id number] PCP: [facility]      Visit Date: 12/01/2016   Universal Protocol:    Date/Time: 12/01/2016  Consent Given By: the patient  Position: PRONE  Additional Comments: Vital signs were monitored before and after the procedure. Patient was prepped and draped in the usual sterile fashion. The correct patient, procedure, and site was verified.   Injection Procedure Details:  Procedure Site One Meds Administered:  Meds ordered this encounter  Medications  . lidocaine (PF) (XYLOCAINE) 1 % injection 2 mL  . betamethasone acetate-betamethasone sodium phosphate (CELESTONE) injection 12 mg    Laterality: Bilateral  Location/Site:  L4-L5  Needle size: 22 G  Needle type: Spinal  Needle Placement: Transforaminal  Findings:  -Contrast Used: 1 mL iohexol 180 mg iodine/mL   -Comments: Excellent flow of contrast along the nerve and into the epidural space.  Procedure Details: After squaring off the end-plates to get a true AP view, the C-arm was positioned so that an oblique view of the foramen as noted above was visualized. The target area is just inferior to the "nose of the scotty dog" or sub pedicular. The soft tissues overlying this structure were infiltrated with 2-3 ml. of 1% Lidocaine without Epinephrine.  The spinal needle was inserted toward the target using a "trajectory" view along the fluoroscope beam.  Under AP and lateral visualization, the needle was advanced so it did not puncture dura and was located close the 6 O'Clock position of the pedical in AP tracterory. Biplanar projections were used to confirm position. Aspiration was confirmed to be negative for CSF and/or blood. A 1-2 ml. volume of Isovue-250 was injected and flow of contrast was noted at each level. Radiographs  were obtained for documentation purposes.   After attaining the desired flow of contrast documented above, a 0.5 to 1.0 ml test dose of 0.25% Marcaine was injected into each respective transforaminal space.  The patient was observed for 90 seconds post injection.  After no sensory deficits were reported, and normal lower extremity motor function was noted,   the above injectate was administered so that equal amounts of the injectate were placed at each foramen (level) into the transforaminal epidural space.   Additional Comments:  The patient tolerated the procedure well Dressing: Band-Aid    Post-procedure details: Patient was observed during the procedure. Post-procedure instructions were reviewed.  Patient left the clinic in stable condition.

## 2016-12-12 NOTE — Progress Notes (Signed)
[name] - 66 y.o. female MRN [id number]  Date of birth: Aug 04, 1950  Office Visit Note: Visit Date: 12/01/2016 PCP: [facility] Referred by: [facility]  Subjective: Chief Complaint  Patient presents with  . Lower Back - Pain  . Left Leg - Pain  . Right Leg - Pain  . Tailbone Pain   HPI: [name] is a very pleasant 66 year old female who sees Dr. Marlou Sa in the office for orthopedic care.  She has been followed by him for several years for knee pain and knee osteoarthritis.  She recently complained to him about worsening back pain and hip pain as well as  coccyx pain.  She reports several months of worsening back pain mostly on the left side but with pain radiating down both posterior thighs to about the knee.  He does not have any paresthesias down the legs.  She has no numbness tingling or weakness.  No foot drop.  She reports initial radiating symptoms past the knee but this has diminished since she began taking the hydrocodone.  She has been taking ibuprofen as well.  She has had no history of prior lumbar spine surgery or interventions.  She has not had recent physical therapy.  She reports her pain is quite severe on the left side of the lower back pain radiating down the legs.  She also gets a lot of pain sitting on the coccyx.  The pain is right at the tailbone is worse with sitting.  She reports some worsening symptoms of the low back with even lying flat.  She is not noted any nocturnal pain or unintended weight loss or fevers chills or night sweats.  Dr. Marlou Sa did obtain an MRI of the lumbar spine and this is reviewed below.    Review of Systems  Constitutional: Negative for chills, fever, malaise/fatigue and weight loss.  HENT: Negative for hearing loss and sinus pain.   Eyes: Negative for blurred vision, double vision and photophobia.  Respiratory: Negative for cough and shortness of breath.   Cardiovascular: Negative for chest  pain, palpitations and leg swelling.  Gastrointestinal: Negative for abdominal pain, nausea and vomiting.  Genitourinary: Negative for flank pain.  Musculoskeletal: Positive for back pain and joint pain. Negative for myalgias.  Skin: Negative for itching and rash.  Neurological: Negative for tremors, focal weakness and weakness.  Endo/Heme/Allergies: Negative.   Psychiatric/Behavioral: Negative for depression.  All other systems reviewed and are negative.  Otherwise per HPI.  Assessment & Plan: Visit Diagnoses:  1. Lumbar radiculopathy   2. Spinal stenosis of lumbar region with neurogenic claudication   3. Spondylolisthesis of lumbar region   4. Coccydynia     Plan: Findings:  Chronic worsening left more than right low back pain which I feel is partially facet joint mediated pain.  Her MRI shows multilevel facet arthropathy with listhesis anteriorly of L4 on L5 and L5 on S1 stairstep fashion.  There is compensatory retrolisthesis very small above these areas.  There is severe stenosis at L4-5.  She is getting radicular type pain in the buttocks and legs which I feel like could be related to the stenosis.  Lastly she does have some level of coccydynia which I feel may be separate.  I would not think the facet joints or the stenosis was because the pain worse with sitting on her tailbone.  She does not report any falls to the tailbone.  At this point I think it would  be wise to complete bilateral transforaminal epidural steroid injection at L4 just to see how much relief she gets overall.  If she gets a lot of relief is obvious something we could repeat from time to time lateral may indicate need for surgical referral.  Dr. Marlou Sa is suggested her seeing Dr. Lorin Mercy.  If she gets some relief but not great we could look at facet joint block.  Lastly, I think she would benefit from physical therapy and we did place an order for that.  Feel like we could do this with somebody who would look at her pelvic  pain and coccydynia as well.    Meds & Orders:  Meds ordered this encounter  Medications  . lidocaine (PF) (XYLOCAINE) 1 % injection 2 mL  . betamethasone acetate-betamethasone sodium phosphate (CELESTONE) injection 12 mg    Orders Placed This Encounter  Procedures  . XR C-ARM NO REPORT  . Ambulatory referral to Physical Therapy  . Epidural Steroid injection    Follow-up: Return if symptoms worsen or fail to improve, for Dr. Lorin Mercy? by Dr. Marlou Sa.   Procedures: No procedures performed  Lumbosacral Transforaminal Epidural Steroid Injection - Sub-Pedicular Approach with Fluoroscopic Guidance  Patient: [name]      Date of Birth: August 09, 1950 MRN: [id number] PCP: [facility]      Visit Date: 12/01/2016   Universal Protocol:    Date/Time: 12/01/2016  Consent Given By: the patient  Position: PRONE  Additional Comments: Vital signs were monitored before and after the procedure. Patient was prepped and draped in the usual sterile fashion. The correct patient, procedure, and site was verified.   Injection Procedure Details:  Procedure Site One Meds Administered:  Meds ordered this encounter  Medications  . lidocaine (PF) (XYLOCAINE) 1 % injection 2 mL  . betamethasone acetate-betamethasone sodium phosphate (CELESTONE) injection 12 mg    Laterality: Bilateral  Location/Site:  L4-L5  Needle size: 22 G  Needle type: Spinal  Needle Placement: Transforaminal  Findings:  -Contrast Used: 1 mL iohexol 180 mg iodine/mL   -Comments: Excellent flow of contrast along the nerve and into the epidural space.  Procedure Details: After squaring off the end-plates to get a true AP view, the C-arm was positioned so that an oblique view of the foramen as noted above was visualized. The target area is just inferior to the "nose of the scotty dog" or sub pedicular. The soft tissues overlying this structure were infiltrated with 2-3 ml. of 1% Lidocaine  without Epinephrine.  The spinal needle was inserted toward the target using a "trajectory" view along the fluoroscope beam.  Under AP and lateral visualization, the needle was advanced so it did not puncture dura and was located close the 6 O'Clock position of the pedical in AP tracterory. Biplanar projections were used to confirm position. Aspiration was confirmed to be negative for CSF and/or blood. A 1-2 ml. volume of Isovue-250 was injected and flow of contrast was noted at each level. Radiographs were obtained for documentation purposes.   After attaining the desired flow of contrast documented above, a 0.5 to 1.0 ml test dose of 0.25% Marcaine was injected into each respective transforaminal space.  The patient was observed for 90 seconds post injection.  After no sensory deficits were reported, and normal lower extremity motor function was noted,   the above injectate was administered so that equal amounts of the injectate were placed at each foramen (level) into the transforaminal epidural space.  Additional Comments:  The patient tolerated the procedure well Dressing: Band-Aid    Post-procedure details: Patient was observed during the procedure. Post-procedure instructions were reviewed.  Patient left the clinic in stable condition.       Clinical History: MRI LUMBAR SPINE WITHOUT CONTRAST  TECHNIQUE: Multiplanar, multisequence MR imaging of the lumbar spine was performed. No intravenous contrast was administered.  COMPARISON:  None.  FINDINGS: Segmentation:  Standard.  Alignment: 5 mm anterolisthesis L4-5. Trace up to 2 mm compensatory retrolisthesis at other levels.  Vertebrae: No worrisome osseous lesion. Endplate reactive changes related to disc disease at L1-2, L2-3, and L3-4.  Conus medullaris: Extends to the L1 level and appears normal.  Paraspinal and other soft tissues: Unremarkable.  Disc levels:  L1-L2: Trace retrolisthesis. Advanced disc  space narrowing. Osseous spurring. Posterior element hypertrophy. Mild stenosis. No definite L2 nerve root impingement.  L2-L3: Trace retrolisthesis. Disc space narrowing. Central protrusion. Posterior element hypertrophy. Mild stenosis. No definite L3 nerve root impingement.  L3-L4: Trace retrolisthesis. Disc space narrowing. Central protrusion. Posterior element hypertrophy. Mild stenosis. Borderline L4 nerve root impingement.  L4-L5: 5 mm anterolisthesis. Disc space narrowing. Central protrusion. Posterior element hypertrophy. Severe stenosis. LEFT greater than RIGHT L5 nerve root impingement.  L5-S1: Disc space narrowing. 2 mm retrolisthesis. Central protrusion. Posterior element hypertrophy. Mild to moderate stenosis. Subarticular zone and foraminal zone narrowing affect the L5 and S1 nerve roots.  IMPRESSION: Severe stenosis at L4-5, LEFT greater than RIGHT related to 5 mm anterolisthesis, central protrusion and posterior element hypertrophy. BILATERAL L5 nerve root impingement is noted.  Similar less severe changes at L5-S1, 2 mm retrolisthesis, central protrusion and posterior element hypertrophy. Due to foraminal narrowing and subarticular zone narrowing, this could affect the L5 and S1 nerve roots.   Electronically Signed   By: Staci Righter M.D.   On: 11/15/2016 17:18  She reports that  has never smoked. she has never used smokeless tobacco. No results for input(s): HGBA1C, LABURIC in the last 8760 hours.  Objective:  VS:  HT:    WT:   BMI:     BP:(!) 149/90  HR:77bpm  TEMP:97.9 F (36.6 C)( )  RESP:100 % Physical Exam  Constitutional: She is oriented to person, place, and time. She appears well-developed and well-nourished. No distress.  HENT:  Head: Normocephalic and atraumatic.  Nose: Nose normal.  Mouth/Throat: Oropharynx is clear and moist.  Eyes: Conjunctivae are normal. Pupils are equal, round, and reactive to light.  Neck: Normal range of  motion. Neck supple.  Cardiovascular: Regular rhythm and intact distal pulses.  Pulmonary/Chest: Effort normal. No respiratory distress.  Abdominal: She exhibits no distension. There is no guarding.  Musculoskeletal:  Patient arises from a seated position with some difficulty.  She has some myofascial pain at the quadratus lumborum and over the PSIS.  She has no pain with hip rotation.  She does have pain with extension of the lumbar spine.  She has good distal strength without clonus.  Neurological: She is alert and oriented to person, place, and time. She exhibits normal muscle tone. Coordination normal.  Skin: Skin is warm. No rash noted. No erythema.  Psychiatric: She has a normal mood and affect. Her behavior is normal.  Nursing note and vitals reviewed.   Ortho Exam Imaging: No results found.  Past Medical/Family/Surgical/Social History: Medications & Allergies reviewed per EMR Patient Active Problem List   Diagnosis Date Noted  . Mixed conductive and sensorineural hearing loss of left ear with restricted hearing  of right ear 12/12/2016  . Acute laryngitis 03/10/2016  . Laryngopharyngeal reflux (LPR) 03/10/2016  . Tympanic membrane perforation, right 03/10/2016   Past Medical History:  Diagnosis Date  . GERD (gastroesophageal reflux disease)   . Hypothyroid    History reviewed. No pertinent family history. History reviewed. No pertinent surgical history. Social History   Occupational History  . Not on file  Tobacco Use  . Smoking status: Never Smoker  . Smokeless tobacco: Never Used  Substance and Sexual Activity  . Alcohol use: Yes    Alcohol/week: 0.6 oz    Types: 1 Glasses of wine per week    Comment: daily  . Drug use: Not on file  . Sexual activity: Not on file

## 2016-12-12 NOTE — Therapy (Addendum)
[facility] [address], [place], [place], [address] Phone: [phone]   Fax:  [phone]  Physical Therapy Evaluation  Patient Details  Name: [name] MRN: [id number] Date of Birth: Jun 12, 1950 Referring Provider: Magnus Sinning, MD   Encounter Date: 12/12/2016  PT End of Session - 12/12/16 1631    Visit Number  1    Number of Visits  10    Date for PT Re-Evaluation  02/06/17    Authorization Type  Medicare: G-codes    PT Start Time  0814    PT Stop Time  1528    PT Time Calculation (min)  40 min    Activity Tolerance  Patient tolerated treatment well    Behavior During Therapy  [facility] for tasks assessed/performed       Past Medical History:  Diagnosis Date  . GERD (gastroesophageal reflux disease)   . Hypothyroid     History reviewed. No pertinent surgical history.  There were no vitals filed for this visit.   Subjective Assessment - 12/12/16 1453    Subjective  Pt presents to PT with chronic history of LBP with intermittent radiculopathy.  Pt had steroid injection 12/01/16.  MRI showed severe spinal stenosis at L4/5 and L5/S1.      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  limited to 1/4 mile    Diagnostic tests  MRI: severe spinal stenosis L4/5 Lt>Rt, L5 nerve root compression bilaterally, L5-S1 retrolisthesis and nerve root compression    Patient Stated Goals  reduce pain, avoid surgery    Currently in Pain?  Yes    Pain Score  4     Pain Location  Back    Pain Orientation  Right;Left    Pain Descriptors / Indicators  Shooting;Sore    Pain Type  Chronic pain    Pain Onset  More than a month ago    Pain Frequency  Intermittent    Aggravating Factors   a lot of activity, walking, bending over    Pain Relieving Factors  ibuprofen, TENs unit, rest         [facility] PT Assessment - 12/12/16 0001      Assessment   Medical Diagnosis  spinal stenosis of lumbar region with neurogenic claudication, coccydynia    Referring Provider  Magnus Sinning, MD    Onset Date/Surgical Date  09/09/16    Next MD Visit  none    Prior Therapy  chiropractor      Precautions   Precautions  None      Restrictions   Weight Bearing Restrictions  Yes      Balance Screen   Has the patient fallen in the past 6 months  No    Has the patient had a decrease in activity level because of a fear of falling?   No    Is the patient reluctant to leave their home because of a fear of falling?   No      Home Environment   Living Environment  Private residence    Living Arrangements  Spouse/significant other    Home Access  Stairs to enter    Home Layout  Two level      Prior Function   Level of Independence  Independent    Vocation  Retired    Leisure  gym exercises, active in the community  Cognition   Overall Cognitive Status  Within Functional Limits for tasks assessed      Observation/Other Assessments   Focus on Therapeutic Outcomes (FOTO)   46% limitation      Posture/Postural Control   Posture/Postural Control  Postural limitations    Postural Limitations  Flexed trunk      ROM / Strength   AROM / PROM / Strength  AROM;PROM;Strength      AROM   Overall AROM   Within functional limits for tasks performed    Overall AROM Comments  Lumbar A/ROM is full with pain in the lumbar spine with extension.  Lt hip A/ROM is limited into ER      PROM   Overall PROM   Deficits    Overall PROM Comments  Bil hip A/ROM into IR is limited by 25% bil.        Strength   Overall Strength  Deficits    Strength Assessment Site  Hip;Knee    Right/Left Hip  Right;Left    Right Hip Flexion  4/5    Right Hip External Rotation   4+/5    Right Hip Internal Rotation  4+/5    Right Hip ABduction  4+/5    Left Hip Flexion  4-/5    Left Hip External Rotation  4/5    Left Hip Internal Rotation  4/5    Left Hip  ABduction  4-/5      Palpation   Spinal mobility  reduced spinal mobility T7-L5 with pain L3-5.      Palpation comment  no palpable tenderness at SI joints or coccyx, mild tenderness over bil lumbar paraspinals L1-5 Lt>RT      Ambulation/Gait   Ambulation/Gait  Yes    Gait Pattern  Step-through pattern    Gait Comments  flexed trunk.               Objective measurements completed on examination: See above findings.              PT Education - 12/12/16 1521    Education provided  Yes    Education Details  lumbar/hip flexibility    Person(s) Educated  Patient    Methods  Explanation;Handout    Comprehension  Verbalized understanding;Returned demonstration       PT Short Term Goals - 12/12/16 1522      PT SHORT TERM GOAL #1   Title  be independent in initial HEP    Time  4    Period  Weeks    Status  New    Target Date  01/09/17      PT SHORT TERM GOAL #2   Title  report a 30% reduction in LBP/LE pain with standing and walking    Time  4    Period  Weeks    Status  New    Target Date  01/09/17      PT SHORT TERM GOAL #3   Title  verbalize and demonstrate body mechanics modifications for lumbar protection with gym exercises and housework    Time  4    Period  Weeks    Status  New    Target Date  01/09/17        PT Long Term Goals - 12/12/16 1615      PT LONG TERM GOAL #1   Title  be independent in advanced HEP    Time  8    Period  Weeks    Status  New  Target Date  02/06/17      PT LONG TERM GOAL #2   Title  reduce FOTO to < or = to 34% limitation    Time  8    Period  Weeks    Status  New    Target Date  02/06/17      PT LONG TERM GOAL #3   Title  report a 60% reduction in LBP/LE pain with standing and walking    Time  8    Period  Weeks    Status  New    Target Date  02/06/17      PT LONG TERM GOAL #4   Title  report < or = to 2/10 LBP/LE pain with housework and gym exercises    Time  8    Period  Weeks    Status  New     Target Date  02/06/17             Plan - 12/12/16 1618    Clinical Impression Statement  Pt presents to PT with complaints of LBP and intermittent LE radiculopathy Lt>Rt that began 08/2016.  Pt had steroid injection 12/01/16 and this helped the pain significantly.  MRI showed severe stenosis at L4/5 Lt>Rt and L5/S1 retrolisthesis.  Pt reports 4/10 pain with activity.  Pt demonstrates reduced hip flexibility and hip weakness on the Lt.  Pt with reduced PA mobility in the thoracic and lumbar spine and tenderness over bil lumbar paraspinals.  Pt will benefit from skilled PT for body mechanics education for gym exercises and housework, core strength, traction, flexibility and manual therapy/modalities for pain.      History and Personal Factors relevant to plan of care:  no significant history    Clinical Presentation  Stable    Clinical Presentation due to:  improving    Clinical Decision Making  Low    Rehab Potential  Good    PT Frequency  2x / week    PT Duration  8 weeks    PT Treatment/Interventions  ADLs/Self Care Home Management;Cryotherapy;Electrical Stimulation;Moist Heat;Ultrasound;Gait training;Functional mobility training;Therapeutic activities;Therapeutic exercise;Patient/family education;Manual techniques;Passive range of motion;Dry needling;Taping    PT Next Visit Plan  core strength, gentle hip flexibility, traction, manual to lumbar spine    Consulted and Agree with Plan of Care  Patient       Patient will benefit from skilled therapeutic intervention in order to improve the following deficits and impairments:  Impaired flexibility, Decreased strength, Decreased range of motion, Decreased activity tolerance, Postural dysfunction, Increased muscle spasms, Pain  Visit Diagnosis: Chronic bilateral low back pain with sciatica, sciatica laterality unspecified - Plan: PT plan of care cert/re-cert  Muscle weakness (generalized) - Plan: PT plan of care cert/re-cert  Stiffness of  left hip, not elsewhere classified - Plan: PT plan of care cert/re-cert  G-Codes - 22/02/54 04/08/1507    Functional Assessment Tool Used (Outpatient Only)  FOTO: 46% limitation    Functional Limitation  Other PT primary    Other PT Primary Current Status (Y7062)  At least 40 percent but less than 60 percent impaired, limited or restricted    Other PT Primary Goal Status (B7628)  At least 20 percent but less than 40 percent impaired, limited or restricted        Problem List Patient Active Problem List   Diagnosis Date Noted  . Mixed conductive and sensorineural hearing loss of left ear with restricted hearing of right ear 12/12/2016  . Acute laryngitis 03/10/2016  .  Laryngopharyngeal reflux (LPR) 03/10/2016  . Tympanic membrane perforation, right 03/10/2016     Sigurd Sos, PT 12/12/16 4:39 PM PHYSICAL THERAPY DISCHARGE SUMMARY  Visits from Start of Care: 1  Current functional level related to goals / functional outcomes: See above.  Pt didn't return after evaluation.     Remaining deficits: See above for current status   Education / Equipment: HEP Plan: Patient agrees to discharge.  Patient goals were not met. Patient is being discharged due to not returning since the last visit.  ?????        Sigurd Sos, PT 02/07/17 10:30 AM  [facility] [address], [place], [place], [address] Phone: [phone]   Fax:  [phone]  Name: [name] MRN: [id number] Date of Birth: 11-Nov-1950

## 2016-12-21 ENCOUNTER — Telehealth (INDEPENDENT_AMBULATORY_CARE_PROVIDER_SITE_OTHER): Payer: Self-pay | Admitting: Orthopedic Surgery

## 2016-12-21 MED ORDER — HYDROCODONE-ACETAMINOPHEN 5-325 MG PO TABS: ORAL_TABLET | ORAL | 0 refills | Status: DC

## 2016-12-21 NOTE — Addendum Note (Signed)
Addended byLaurann [place] on: 12/21/2016 03:15 PM   Modules accepted: Orders

## 2016-12-21 NOTE — Telephone Encounter (Signed)
ok 

## 2016-12-21 NOTE — Telephone Encounter (Signed)
Patient called asking for a refill on hydrocodone. CB # [id number]

## 2016-12-21 NOTE — Telephone Encounter (Signed)
IC advised could pick up at front desk.  

## 2016-12-21 NOTE — Telephone Encounter (Signed)
Ok to rf? 

## 2016-12-22 ENCOUNTER — Telehealth (INDEPENDENT_AMBULATORY_CARE_PROVIDER_SITE_OTHER): Payer: Self-pay | Admitting: Physical Medicine and Rehabilitation

## 2016-12-22 NOTE — Telephone Encounter (Signed)
If injection helped more than 50% and still helping or helped more than that but short lived then ok to repeat.

## 2016-12-22 NOTE — Telephone Encounter (Signed)
Patient said injection helped about 90% for a short time. Scheduled for repeat on 01/04/17 at 0930 with a driver.

## 2016-12-26 ENCOUNTER — Ambulatory Visit: Payer: Medicare Other

## 2016-12-30 ENCOUNTER — Encounter (INDEPENDENT_AMBULATORY_CARE_PROVIDER_SITE_OTHER): Payer: Self-pay | Admitting: Orthopedic Surgery

## 2016-12-30 ENCOUNTER — Ambulatory Visit (INDEPENDENT_AMBULATORY_CARE_PROVIDER_SITE_OTHER): Payer: Medicare Other | Admitting: Orthopedic Surgery

## 2016-12-30 DIAGNOSIS — M1711 Unilateral primary osteoarthritis, right knee: Secondary | ICD-10-CM

## 2016-12-31 ENCOUNTER — Encounter (INDEPENDENT_AMBULATORY_CARE_PROVIDER_SITE_OTHER): Payer: Self-pay | Admitting: Orthopedic Surgery

## 2016-12-31 DIAGNOSIS — M1711 Unilateral primary osteoarthritis, right knee: Secondary | ICD-10-CM

## 2016-12-31 MED ORDER — LIDOCAINE HCL 1 % IJ SOLN
5.0000 mL | INTRAMUSCULAR | Status: AC | PRN
  Administered 2016-12-31: 5 mL

## 2016-12-31 MED ORDER — BUPIVACAINE HCL 0.25 % IJ SOLN
4.0000 mL | INTRAMUSCULAR | Status: AC | PRN
  Administered 2016-12-31: 4 mL via INTRA_ARTICULAR

## 2016-12-31 MED ORDER — METHYLPREDNISOLONE ACETATE 40 MG/ML IJ SUSP
40.0000 mg | INTRAMUSCULAR | Status: AC | PRN
  Administered 2016-12-31: 40 mg via INTRA_ARTICULAR

## 2016-12-31 NOTE — Progress Notes (Signed)
Office Visit Note   Patient: [name]           Date of Birth: 04-01-1950           MRN: [id number] Visit Date: 12/30/2016 Requested by: [facility] [address] [place], [place] [address] PCP: [facility]  Subjective: Chief Complaint  Patient presents with  . Right Knee - Pain    HPI: Patient is a 66 year old female with known right knee arthritis.  Last cortisone injection was in July which was helpful.  A lot of pain at night and pain that limits her ambulation.  She denies any mechanical symptoms.              ROS: All systems reviewed are negative as they relate to the chief complaint within the history of present illness.  Patient denies  fevers or chills.   Assessment & Plan: Visit Diagnoses: No diagnosis found.  Plan: Impression is right knee arthritis with good response to injection in July his injection into the knee.  She may need knee replacement at some time in the future.  Follow-up with me as needed  Follow-Up Instructions: Return if symptoms worsen or fail to improve.   Orders:  No orders of the defined types were placed in this encounter.  No orders of the defined types were placed in this encounter.     Procedures: Large Joint Inj: R knee on 12/31/2016 11:57 AM Indications: diagnostic evaluation, joint swelling and pain Details: 18 G 1.5 in needle, superolateral approach  Arthrogram: No  Medications: 5 mL lidocaine 1 %; 40 mg methylPREDNISolone acetate 40 MG/ML; 4 mL bupivacaine 0.25 % Outcome: tolerated well, no immediate complications Procedure, treatment alternatives, risks and benefits explained, specific risks discussed. Consent was given by the patient. Immediately prior to procedure a time out was called to verify the correct patient, procedure, equipment, support staff and site/side marked as required. Patient was prepped and draped in the usual sterile fashion.        Clinical Data: No additional findings.  Objective: Vital Signs: There were no vitals taken for this visit.  Physical Exam:   Constitutional: Patient appears well-developed HEENT:  Head: Normocephalic Eyes:EOM are normal Neck: Normal range of motion Cardiovascular: Normal rate Pulmonary/chest: Effort normal Neurologic: Patient is alert Skin: Skin is warm Psychiatric: Patient has normal mood and affect    Ortho Exam: Orthopedic exam demonstrates no effusion in the right knee.  There is medial and lateral joint line tenderness with a 5-7 degree flexion contracture.  Pedal pulses palpable.  No groin pain with internal/external rotation of the leg.  There are no other masses lymphadenopathy or skin changes noted in the right knee region.  Patellofemoral crepitus is present.  Specialty Comments:  No specialty comments available.  Imaging: No results found.   PMFS History: Patient Active Problem List   Diagnosis Date Noted  . Mixed conductive and sensorineural hearing loss of left ear with restricted hearing of right ear 12/12/2016  . Acute laryngitis 03/10/2016  . Laryngopharyngeal reflux (LPR) 03/10/2016  . Tympanic membrane perforation, right 03/10/2016   Past Medical History:  Diagnosis Date  . GERD (gastroesophageal reflux disease)   . Hypothyroid     History reviewed. No pertinent family history.  History reviewed. No pertinent surgical history. Social History   Occupational History  . Not on file  Tobacco Use  . Smoking status: Never Smoker  . Smokeless tobacco: Never Used  Substance and Sexual Activity  . Alcohol use: Yes    Alcohol/week: 0.6 oz    Types: 1 Glasses of wine per week    Comment: daily  . Drug use: Not on file  . Sexual activity: Not on file

## 2017-01-02 DIAGNOSIS — Z1231 Encounter for screening mammogram for malignant neoplasm of breast: Secondary | ICD-10-CM | POA: Diagnosis not present

## 2017-01-04 ENCOUNTER — Encounter (INDEPENDENT_AMBULATORY_CARE_PROVIDER_SITE_OTHER): Payer: Self-pay | Admitting: Physical Medicine and Rehabilitation

## 2017-01-04 ENCOUNTER — Ambulatory Visit (INDEPENDENT_AMBULATORY_CARE_PROVIDER_SITE_OTHER): Payer: Medicare Other | Admitting: Physical Medicine and Rehabilitation

## 2017-01-04 ENCOUNTER — Ambulatory Visit (INDEPENDENT_AMBULATORY_CARE_PROVIDER_SITE_OTHER): Payer: Medicare Other

## 2017-01-04 VITALS — BP 119/77 | HR 81

## 2017-01-04 DIAGNOSIS — M48062 Spinal stenosis, lumbar region with neurogenic claudication: Secondary | ICD-10-CM

## 2017-01-04 DIAGNOSIS — M5416 Radiculopathy, lumbar region: Secondary | ICD-10-CM | POA: Diagnosis not present

## 2017-01-04 MED ORDER — BETAMETHASONE SOD PHOS & ACET 6 (3-3) MG/ML IJ SUSP: 12.0000 mg | Freq: Once | INTRAMUSCULAR | Status: DC

## 2017-01-04 MED ORDER — LIDOCAINE HCL (PF) 1 % IJ SOLN: 2.0000 mL | Freq: Once | INTRAMUSCULAR | Status: DC

## 2017-01-04 NOTE — Progress Notes (Deleted)
Mrs. Hennen is a  66 year old female that comes in today for Repeat Bilateral L4 TF. Last injection was painful at first and then got a little  better. Has sharp pain at times in the groin area.  When picking up something off the floor feels like something is folding. She states sometimes she cannot hold her urine and doesn't know if this is related. Taking 1/2 tab of hydrocodone when needed. Pain radiates down into legs sometimes. At night when moving around she gets pain. Can only sleep on right side.

## 2017-01-04 NOTE — Patient Instructions (Signed)

## 2017-01-17 HISTORY — PX: IMPLANTATION BONE ANCHORED HEARING AID: SUR691

## 2017-01-23 ENCOUNTER — Telehealth (INDEPENDENT_AMBULATORY_CARE_PROVIDER_SITE_OTHER): Payer: Self-pay | Admitting: Orthopedic Surgery

## 2017-01-23 MED ORDER — HYDROCODONE-ACETAMINOPHEN 5-325 MG PO TABS: ORAL_TABLET | ORAL | 0 refills | Status: DC

## 2017-01-23 NOTE — Telephone Encounter (Signed)
Hydrocodone

## 2017-01-23 NOTE — Telephone Encounter (Signed)
Okay for 1 daily as needed number 20 tablets no refill please call thanks and I would also let her know that we need to stay away from chronic narcotic use even though this is a very small amount I do not think she wants to get in the habit of using this medication for that problem in her knee

## 2017-01-23 NOTE — Telephone Encounter (Signed)
IC s/w and advised per Dr Marlou Sa. She verbalized understanding.

## 2017-01-23 NOTE — Telephone Encounter (Signed)
Patient wants refill on hydrocodone. Please advise if ok to refill?

## 2017-01-24 NOTE — Procedures (Signed)
[name] is a 67 year old female who we recently saw for L4 transforaminal epidural steroid injection that did give her quite a bit of relief.  She reports that the injection was somewhat painful at first but then it did get a little better over time.  She still gets sharp pain in the groin area.  When she is picking something off the floor she feels like something is folding.  She is fairly dramatic progressive in the amount and character of her pain she reports having some urinary incontinence.  She has been undergoing physical therapy at [facility] used to take a half a tablet of hydrocodone when needed.  She does get some radiating symptoms into the legs.  I think it would be wise to repeat the injection, which was a bilateral L4 transforaminal injection at the level of severe stenosis.  67 years old she unfortunately is going to have to at least be evaluated by neurosurgery or spine surgery is followed by Dr. Marlou Sa in our office.  Lumbosacral Transforaminal Epidural Steroid Injection - Sub-Pedicular Approach with Fluoroscopic Guidance  Patient: [name]      Date of Birth: 10/27/1950 MRN: [id number] PCP: [facility]      Visit Date: 01/04/2017   Universal Protocol:    Date/Time: 01/04/2017  Consent Given By: the patient  Position: PRONE  Additional Comments: Vital signs were monitored before and after the procedure. Patient was prepped and draped in the usual sterile fashion. The correct patient, procedure, and site was verified.   Injection Procedure Details:  Procedure Site One Meds Administered:  Meds ordered this encounter  Medications  . lidocaine (PF) (XYLOCAINE) 1 % injection 2 mL  . betamethasone acetate-betamethasone sodium phosphate (CELESTONE) injection 12 mg    Laterality: Bilateral  Location/Site:  L4-L5  Needle size: 22 G  Needle type: Spinal  Needle Placement: Transforaminal  Findings:    -Comments:  Excellent flow of contrast along the nerve and into the epidural space.  Procedure Details: After squaring off the end-plates to get a true AP view, the C-arm was positioned so that an oblique view of the foramen as noted above was visualized. The target area is just inferior to the "nose of the scotty dog" or sub pedicular. The soft tissues overlying this structure were infiltrated with 2-3 ml. of 1% Lidocaine without Epinephrine.  The spinal needle was inserted toward the target using a "trajectory" view along the fluoroscope beam.  Under AP and lateral visualization, the needle was advanced so it did not puncture dura and was located close the 6 O'Clock position of the pedical in AP tracterory. Biplanar projections were used to confirm position. Aspiration was confirmed to be negative for CSF and/or blood. A 1-2 ml. volume of Isovue-250 was injected and flow of contrast was noted at each level. Radiographs were obtained for documentation purposes.   After attaining the desired flow of contrast documented above, a 0.5 to 1.0 ml test dose of 0.25% Marcaine was injected into each respective transforaminal space.  The patient was observed for 90 seconds post injection.  After no sensory deficits were reported, and normal lower extremity motor function was noted,   the above injectate was administered so that equal amounts of the injectate were placed at each foramen (level) into the transforaminal epidural space.   Additional Comments:  The patient tolerated the procedure well Dressing: Band-Aid    Post-procedure details: Patient was observed during the procedure. Post-procedure instructions were reviewed.  Patient left the clinic in stable condition.   Pertinent Imaging: MRI LUMBAR SPINE WITHOUT CONTRAST  TECHNIQUE: Multiplanar, multisequence MR imaging of the lumbar spine was performed. No intravenous contrast was administered.  COMPARISON:  None.  FINDINGS: Segmentation:   Standard.  Alignment: 5 mm anterolisthesis L4-5. Trace up to 2 mm compensatory retrolisthesis at other levels.  Vertebrae: No worrisome osseous lesion. Endplate reactive changes related to disc disease at L1-2, L2-3, and L3-4.  Conus medullaris: Extends to the L1 level and appears normal.  Paraspinal and other soft tissues: Unremarkable.  Disc levels:  L1-L2: Trace retrolisthesis. Advanced disc space narrowing. Osseous spurring. Posterior element hypertrophy. Mild stenosis. No definite L2 nerve root impingement.  L2-L3: Trace retrolisthesis. Disc space narrowing. Central protrusion. Posterior element hypertrophy. Mild stenosis. No definite L3 nerve root impingement.  L3-L4: Trace retrolisthesis. Disc space narrowing. Central protrusion. Posterior element hypertrophy. Mild stenosis. Borderline L4 nerve root impingement.  L4-L5: 5 mm anterolisthesis. Disc space narrowing. Central protrusion. Posterior element hypertrophy. Severe stenosis. LEFT greater than RIGHT L5 nerve root impingement.  L5-S1: Disc space narrowing. 2 mm retrolisthesis. Central protrusion. Posterior element hypertrophy. Mild to moderate stenosis. Subarticular zone and foraminal zone narrowing affect the L5 and S1 nerve roots.  IMPRESSION: Severe stenosis at L4-5, LEFT greater than RIGHT related to 5 mm anterolisthesis, central protrusion and posterior element hypertrophy. BILATERAL L5 nerve root impingement is noted.  Similar less severe changes at L5-S1, 2 mm retrolisthesis, central protrusion and posterior element hypertrophy. Due to foraminal narrowing and subarticular zone narrowing, this could affect the L5 and S1 nerve roots.   Electronically Signed   By: Staci Righter M.D.   On: 11/15/2016 17:18

## 2017-02-02 DIAGNOSIS — Z7982 Long term (current) use of aspirin: Secondary | ICD-10-CM | POA: Diagnosis not present

## 2017-02-02 DIAGNOSIS — H90A32 Mixed conductive and sensorineural hearing loss, unilateral, left ear with restricted hearing on the contralateral side: Secondary | ICD-10-CM | POA: Diagnosis not present

## 2017-02-09 ENCOUNTER — Ambulatory Visit (INDEPENDENT_AMBULATORY_CARE_PROVIDER_SITE_OTHER): Payer: Medicare Other

## 2017-02-09 ENCOUNTER — Encounter (INDEPENDENT_AMBULATORY_CARE_PROVIDER_SITE_OTHER): Payer: Self-pay | Admitting: Specialist

## 2017-02-09 ENCOUNTER — Ambulatory Visit (INDEPENDENT_AMBULATORY_CARE_PROVIDER_SITE_OTHER): Payer: Medicare Other | Admitting: Specialist

## 2017-02-09 VITALS — BP 129/86 | HR 87 | Ht 62.0 in | Wt 180.0 lb

## 2017-02-09 DIAGNOSIS — M545 Low back pain: Secondary | ICD-10-CM | POA: Diagnosis not present

## 2017-02-09 DIAGNOSIS — M4316 Spondylolisthesis, lumbar region: Secondary | ICD-10-CM

## 2017-02-09 DIAGNOSIS — M48062 Spinal stenosis, lumbar region with neurogenic claudication: Secondary | ICD-10-CM

## 2017-02-09 MED ORDER — HYDROCODONE-ACETAMINOPHEN 5-325 MG PO TABS: ORAL_TABLET | ORAL | 0 refills | Status: DC

## 2017-02-09 NOTE — Progress Notes (Signed)
Office Visit Note   Patient: [name]           Date of Birth: [birth year]           MRN: [id number] Visit Date: 02/09/2017              Requested by: [facility] [address] [place], [place] [address] PCP: [place], [place]: Visit Diagnoses:  1. Low back pain, unspecified back pain laterality, unspecified chronicity, with sciatica presence unspecified   2. Spinal stenosis of lumbar region with neurogenic claudication   3. Spondylolisthesis, lumbar region     Plan: Avoid bending, stooping and avoid lifting weights greater than 10 lbs. Avoid prolong standing and walking.   Surgery recommended is a one level lumbar fusion L4-5 this would be done with rods, screws and cages with local bone graft and allograft (donor bone graft). Take hydrocodone for for pain. Risk of surgery includes risk of infection 1 in 200 patients, bleeding 1/2% chance you would need a transfusion.   Risk to the nerves is one in 10,000. You will need to use a brace for 3 months and wean from the brace on the 4th month. Expect improved walking and standing tolerance. Expect relief of leg pain and groin but numbness may persist depending on the length and degree of pressure that has been present.  Follow-Up Instructions: Return in about 4 weeks (around 03/09/2017).   Orders:  Orders Placed This Encounter  Procedures  . XR Lumbar Spine 2-3 Views   No orders of the defined types were placed in this encounter.     Procedures: No procedures performed   Clinical Data: No additional findings.   Subjective: Chief Complaint  Patient presents with  . Lower Back - Pain    67 year old female with pain in the back and into the buttocks and thighs, previous history of back condition treated by a DC in the past. The pain worsened acutely bending to pick up an item off The kitchen floor. The pain was severe and she had  difficulty moving very well, flew the next day back from [place], [place]. She was having persistent pain and saw Dr. Marlou Sa whom had been following her for knee problems. He had her undergo MRI and recommended ESIs, Dr.Newton performed these one in 11/2016 and 12/2016. She has difficulty ambulating any distance. She had a problem with her knee on the right, no surgery associated with the right knee, Dr. Marlou Sa has diagnosed OA of the right knee and recommended TKR. The ESIs help the pain for up to one month but the pain is persisting and recurring. At its worst the back pain with radiation into the legs and into the vagina. She has seen her primary care MD, Sadie Haber on [address] Dr. Leeroy Cha. Did go to PT one session for exercises at [facility] at [facility]. She does do grocery shopping and is able to make it through the store, lifting the bags is uncomforable. Pushing the grandchildren with walkiing seems to make the back feel better. She has difficulty with pain at night, turning is painful, AM with stiffness in the back and difficulty standing. She uses a TENs unit to assist with morning pain. She takes Ibuprofen that helps. She takes hydrocodone intermittantly, one last night and none until last night since the last ESI. Did take some over xmas when visiting family in [place]. Not able to  walk a mile but is grocery shopping, and walking her grandchildren, sometimes with severe pain with bending to reach her shoes and socks.      Review of Systems  Constitutional: Negative.   HENT: Negative.   Eyes: Negative.   Respiratory: Negative.   Cardiovascular: Negative.   Gastrointestinal: Negative.   Endocrine: Negative.   Genitourinary: Negative.   Musculoskeletal: Negative.   Skin: Negative.   Allergic/Immunologic: Negative.   Neurological: Negative.   Hematological: Negative.   Psychiatric/Behavioral: Negative.      Objective: Vital Signs: BP 129/86 (BP Location: Right Arm, Patient Position: Sitting)    Pulse 87   Ht 5\' 2"  (1.575 m)   Wt 180 lb (81.6 kg)   BMI 32.92 kg/m   Physical Exam  Constitutional: She is oriented to person, place, and time. She appears well-developed and well-nourished.  HENT:  Head: Normocephalic and atraumatic.  Eyes: EOM are normal. Pupils are equal, round, and reactive to light.  Neck: Normal range of motion. Neck supple.  Pulmonary/Chest: Effort normal and breath sounds normal.  Abdominal: Soft. Bowel sounds are normal.  Neurological: She is alert and oriented to person, place, and time.  Skin: Skin is warm and dry.  Psychiatric: She has a normal mood and affect. Her behavior is normal. Judgment and thought content normal.    Back Exam   Tenderness  The patient is experiencing tenderness in the lumbar.  Range of Motion  Extension: abnormal  Flexion: abnormal  Lateral bend right: normal  Lateral bend left: normal  Rotation right: normal  Rotation left: normal   Muscle Strength  Right Quadriceps:  5/5  Left Quadriceps:  5/5  Right Hamstrings:  5/5  Left Hamstrings:  5/5   Tests  Straight leg raise right: negative Straight leg raise left: positive  Reflexes  Patellar: normal Achilles: normal Babinski's sign: normal   Other  Toe walk: abnormal Heel walk: abnormal Sensation: normal Gait: abnormal  Erythema: no back redness Scars: absent  Comments:  Bilateral EHL weakness.      Specialty Comments:  No specialty comments available.  Imaging: No results found.   PMFS History: Patient Active Problem List   Diagnosis Date Noted  . Mixed conductive and sensorineural hearing loss of left ear with restricted hearing of right ear 12/12/2016  . Acute laryngitis 03/10/2016  . Laryngopharyngeal reflux (LPR) 03/10/2016  . Tympanic membrane perforation, right 03/10/2016   Past Medical History:  Diagnosis Date  . GERD (gastroesophageal reflux disease)   . Hypothyroid     No family history on file.  No past surgical history  on file. Social History   Occupational History  . Not on file  Tobacco Use  . Smoking status: Never Smoker  . Smokeless tobacco: Never Used  Substance and Sexual Activity  . Alcohol use: Yes    Alcohol/week: 0.6 oz    Types: 1 Glasses of wine per week    Comment: daily  . Drug use: Not on file  . Sexual activity: Not on file

## 2017-02-09 NOTE — Patient Instructions (Addendum)
Avoid bending, stooping and avoid lifting weights greater than 10 lbs. Avoid prolong standing and walking.   Surgery recommended is a one level lumbar fusion L4-5 this would be done with rods, screws and cages with local bone graft and allograft (donor bone graft). Take hydrocodone for for pain. Risk of surgery includes risk of infection 1 in 200 patients, bleeding 1/2% chance you would need a transfusion.   Risk to the nerves is one in 10,000. You will need to use a brace for 3 months and wean from the brace on the 4th month. Expect improved walking and standing tolerance. Expect relief of leg pain and groin but numbness may persist depending on the length and degree of pressure that has been present.

## 2017-02-10 ENCOUNTER — Other Ambulatory Visit (INDEPENDENT_AMBULATORY_CARE_PROVIDER_SITE_OTHER): Payer: Self-pay | Admitting: Orthopedic Surgery

## 2017-02-10 NOTE — Telephone Encounter (Signed)
y

## 2017-02-10 NOTE — Telephone Encounter (Signed)
Ok to rf? 

## 2017-02-14 DIAGNOSIS — Z48810 Encounter for surgical aftercare following surgery on the sense organs: Secondary | ICD-10-CM | POA: Diagnosis not present

## 2017-02-14 DIAGNOSIS — Z9621 Cochlear implant status: Secondary | ICD-10-CM | POA: Diagnosis not present

## 2017-02-14 DIAGNOSIS — H90A32 Mixed conductive and sensorineural hearing loss, unilateral, left ear with restricted hearing on the contralateral side: Secondary | ICD-10-CM | POA: Diagnosis not present

## 2017-02-21 DIAGNOSIS — M48062 Spinal stenosis, lumbar region with neurogenic claudication: Secondary | ICD-10-CM | POA: Diagnosis not present

## 2017-02-21 DIAGNOSIS — H9012 Conductive hearing loss, unilateral, left ear, with unrestricted hearing on the contralateral side: Secondary | ICD-10-CM | POA: Diagnosis not present

## 2017-02-21 DIAGNOSIS — E039 Hypothyroidism, unspecified: Secondary | ICD-10-CM | POA: Diagnosis not present

## 2017-02-21 DIAGNOSIS — E785 Hyperlipidemia, unspecified: Secondary | ICD-10-CM | POA: Diagnosis not present

## 2017-02-22 ENCOUNTER — Ambulatory Visit (INDEPENDENT_AMBULATORY_CARE_PROVIDER_SITE_OTHER): Payer: Self-pay

## 2017-02-22 ENCOUNTER — Encounter (INDEPENDENT_AMBULATORY_CARE_PROVIDER_SITE_OTHER): Payer: Self-pay | Admitting: Physical Medicine and Rehabilitation

## 2017-02-22 ENCOUNTER — Ambulatory Visit (INDEPENDENT_AMBULATORY_CARE_PROVIDER_SITE_OTHER): Payer: Medicare Other | Admitting: Physical Medicine and Rehabilitation

## 2017-02-22 VITALS — BP 118/65 | HR 86 | Temp 97.8°F

## 2017-02-22 DIAGNOSIS — M5416 Radiculopathy, lumbar region: Secondary | ICD-10-CM

## 2017-02-22 MED ORDER — BETAMETHASONE SOD PHOS & ACET 6 (3-3) MG/ML IJ SUSP
12.0000 mg | Freq: Once | INTRAMUSCULAR | Status: AC
  Administered 2017-02-22: 12 mg

## 2017-02-22 NOTE — Patient Instructions (Signed)

## 2017-02-22 NOTE — Progress Notes (Deleted)
Pt states an on and off sharp pain in lower back and she has pain "inside her vagina". Pt states pain has been going on since August 2018, and it has been constant for the past week. Pt states last injection helped, but only for 5 weeks. Pt states it just comes and goes with any movement, pain medication helps make it better. +Driver, -BT, -Dye Allergies.

## 2017-03-03 ENCOUNTER — Telehealth (INDEPENDENT_AMBULATORY_CARE_PROVIDER_SITE_OTHER): Payer: Self-pay | Admitting: Specialist

## 2017-03-03 NOTE — Telephone Encounter (Signed)
Patient requesting X-ray disc of lumbar/back for referral of second opinion. Filled out medical release form.

## 2017-03-03 NOTE — Telephone Encounter (Signed)
Disc made. Patient picked up.

## 2017-03-06 ENCOUNTER — Telehealth (INDEPENDENT_AMBULATORY_CARE_PROVIDER_SITE_OTHER): Payer: Self-pay | Admitting: Physical Medicine and Rehabilitation

## 2017-03-06 DIAGNOSIS — R102 Pelvic and perineal pain: Secondary | ICD-10-CM | POA: Diagnosis not present

## 2017-03-06 NOTE — Procedures (Signed)
Lumbosacral Transforaminal Epidural Steroid Injection - Sub-Pedicular Approach with Fluoroscopic Guidance  Patient: [name]      Date of Birth: [birth year] MRN: [id number] PCP: [facility]      Visit Date: 02/22/2017   Universal Protocol:    Date/Time: 02/22/2017  Consent Given By: the patient  Position: PRONE  Additional Comments: Vital signs were monitored before and after the procedure. Patient was prepped and draped in the usual sterile fashion. The correct patient, procedure, and site was verified.   Injection Procedure Details:  Procedure Site One Meds Administered:  Meds ordered this encounter  Medications  . betamethasone acetate-betamethasone sodium phosphate (CELESTONE) injection 12 mg    Laterality: Bilateral  Location/Site:  L4-L5  Needle size: 22 G  Needle type: Spinal  Needle Placement: Transforaminal  Findings:    -Comments: Excellent flow of contrast along the nerve and into the epidural space.  Procedure Details: After squaring off the end-plates to get a true AP view, the C-arm was positioned so that an oblique view of the foramen as noted above was visualized. The target area is just inferior to the "nose of the scotty dog" or sub pedicular. The soft tissues overlying this structure were infiltrated with 2-3 ml. of 1% Lidocaine without Epinephrine.  The spinal needle was inserted toward the target using a "trajectory" view along the fluoroscope beam.  Under AP and lateral visualization, the needle was advanced so it did not puncture dura and was located close the 6 O'Clock position of the pedical in AP tracterory. Biplanar projections were used to confirm position. Aspiration was confirmed to be negative for CSF and/or blood. A 1-2 ml. volume of Isovue-250 was injected and flow of contrast was noted at each level. Radiographs were obtained for documentation purposes.   After attaining the desired flow of contrast  documented above, a 0.5 to 1.0 ml test dose of 0.25% Marcaine was injected into each respective transforaminal space.  The patient was observed for 90 seconds post injection.  After no sensory deficits were reported, and normal lower extremity motor function was noted,   the above injectate was administered so that equal amounts of the injectate were placed at each foramen (level) into the transforaminal epidural space.   Additional Comments:  The patient tolerated the procedure well Dressing: Band-Aid    Post-procedure details: Patient was observed during the procedure. Post-procedure instructions were reviewed.  Patient left the clinic in stable condition.

## 2017-03-06 NOTE — Telephone Encounter (Signed)
She needs an appointment with Dr. Louanne Skye if she already does not have one.  She has severe stenosis at L4-5.

## 2017-03-06 NOTE — Telephone Encounter (Signed)
See messages below. It does not look like she has anything scheduled with Dr. Louanne Skye yet.

## 2017-03-06 NOTE — Telephone Encounter (Signed)
Patient is going for 2nd opinion

## 2017-03-06 NOTE — Progress Notes (Signed)
[name] - 67 y.o. female MRN [id number]  Date of birth: [birth year]  Office Visit Note: Visit Date: 02/22/2017 PCP: [facility] Referred by: [facility]  Subjective: Chief Complaint  Patient presents with  . Lower Back - Pain   HPI: [name] is a 67 year old female that comes in today at the request of Dr. Louanne Skye for bilateral L4 transforaminal epidural steroid injection.  By way of history she was somewhat that I saw who was seeing Dr. Marlou Sa at the time bleeding a right L4 transforaminal epidural steroid injection in November 2018 with decent relief of her symptoms but short-lived.  We went on to complete a second injection but did this from bilateral standpoint which really only lasted a few weeks.  She reports today on and off pain in the lower back she has "inside her vagina "increasing symptoms with any sort of movement.  The injection  will be diagnostic and hopefully therapeutic. The patient has failed conservative care including time, medications and activity modification.    ROS Otherwise per HPI.  Assessment & Plan: Visit Diagnoses:  1. Lumbar radiculopathy     Plan: Findings:  Bilateral L4 transforaminal epidural steroid injection.  Patient does have severe this at L4-5.  She will follow-up with Dr. Louanne Skye.    Meds & Orders:  Meds ordered this encounter  Medications  . betamethasone acetate-betamethasone sodium phosphate (CELESTONE) injection 12 mg    Orders Placed This Encounter  Procedures  . XR C-ARM NO REPORT  . Epidural Steroid injection    Follow-up: Return for Dr. Louanne Skye.   Procedures: No procedures performed  Lumbosacral Transforaminal Epidural Steroid Injection - Sub-Pedicular Approach with Fluoroscopic Guidance  Patient: [name]      Date of Birth: 08/23/1950 MRN: [id number] PCP: [facility]      Visit Date: 02/22/2017   Universal Protocol:    Date/Time:  02/22/2017  Consent Given By: the patient  Position: PRONE  Additional Comments: Vital signs were monitored before and after the procedure. Patient was prepped and draped in the usual sterile fashion. The correct patient, procedure, and site was verified.   Injection Procedure Details:  Procedure Site One Meds Administered:  Meds ordered this encounter  Medications  . betamethasone acetate-betamethasone sodium phosphate (CELESTONE) injection 12 mg    Laterality: Bilateral  Location/Site:  L4-L5  Needle size: 22 G  Needle type: Spinal  Needle Placement: Transforaminal  Findings:    -Comments: Excellent flow of contrast along the nerve and into the epidural space.  Procedure Details: After squaring off the end-plates to get a true AP view, the C-arm was positioned so that an oblique view of the foramen as noted above was visualized. The target area is just inferior to the "nose of the scotty dog" or sub pedicular. The soft tissues overlying this structure were infiltrated with 2-3 ml. of 1% Lidocaine without Epinephrine.  The spinal needle was inserted toward the target using a "trajectory" view along the fluoroscope beam.  Under AP and lateral visualization, the needle was advanced so it did not puncture dura and was located close the 6 O'Clock position of the pedical in AP tracterory. Biplanar projections were used to confirm position. Aspiration was confirmed to be negative for CSF and/or blood. A 1-2 ml. volume of Isovue-250 was injected and flow of contrast was noted at each level. Radiographs were obtained for documentation purposes.   After attaining the desired flow of contrast  documented above, a 0.5 to 1.0 ml test dose of 0.25% Marcaine was injected into each respective transforaminal space.  The patient was observed for 90 seconds post injection.  After no sensory deficits were reported, and normal lower extremity motor function was noted,   the above injectate was  administered so that equal amounts of the injectate were placed at each foramen (level) into the transforaminal epidural space.   Additional Comments:  The patient tolerated the procedure well Dressing: Band-Aid    Post-procedure details: Patient was observed during the procedure. Post-procedure instructions were reviewed.  Patient left the clinic in stable condition.    Clinical History: MRI LUMBAR SPINE WITHOUT CONTRAST  TECHNIQUE: Multiplanar, multisequence MR imaging of the lumbar spine was performed. No intravenous contrast was administered.  COMPARISON:  None.  FINDINGS: Segmentation:  Standard.  Alignment: 5 mm anterolisthesis L4-5. Trace up to 2 mm compensatory retrolisthesis at other levels.  Vertebrae: No worrisome osseous lesion. Endplate reactive changes related to disc disease at L1-2, L2-3, and L3-4.  Conus medullaris: Extends to the L1 level and appears normal.  Paraspinal and other soft tissues: Unremarkable.  Disc levels:  L1-L2: Trace retrolisthesis. Advanced disc space narrowing. Osseous spurring. Posterior element hypertrophy. Mild stenosis. No definite L2 nerve root impingement.  L2-L3: Trace retrolisthesis. Disc space narrowing. Central protrusion. Posterior element hypertrophy. Mild stenosis. No definite L3 nerve root impingement.  L3-L4: Trace retrolisthesis. Disc space narrowing. Central protrusion. Posterior element hypertrophy. Mild stenosis. Borderline L4 nerve root impingement.  L4-L5: 5 mm anterolisthesis. Disc space narrowing. Central protrusion. Posterior element hypertrophy. Severe stenosis. LEFT greater than RIGHT L5 nerve root impingement.  L5-S1: Disc space narrowing. 2 mm retrolisthesis. Central protrusion. Posterior element hypertrophy. Mild to moderate stenosis. Subarticular zone and foraminal zone narrowing affect the L5 and S1 nerve roots.  IMPRESSION: Severe stenosis at L4-5, LEFT greater than RIGHT  related to 5 mm anterolisthesis, central protrusion and posterior element hypertrophy. BILATERAL L5 nerve root impingement is noted.  Similar less severe changes at L5-S1, 2 mm retrolisthesis, central protrusion and posterior element hypertrophy. Due to foraminal narrowing and subarticular zone narrowing, this could affect the L5 and S1 nerve roots.   Electronically Signed   By: Staci Righter M.D.   On: 11/15/2016 17:18  She reports that  has never smoked. she has never used smokeless tobacco. No results for input(s): HGBA1C, LABURIC in the last 8760 hours.  Objective:  VS:  HT:    WT:   BMI:     BP:118/65  HR:86bpm  TEMP:97.8 F (36.6 C)(Oral)  RESP:98 % Physical Exam  Musculoskeletal:  Patient has good distal strength.    Ortho Exam Imaging: No results found.  Past Medical/Family/Surgical/Social History: Medications & Allergies reviewed per EMR Patient Active Problem List   Diagnosis Date Noted  . Mixed conductive and sensorineural hearing loss of left ear with restricted hearing of right ear 12/12/2016  . Acute laryngitis 03/10/2016  . Laryngopharyngeal reflux (LPR) 03/10/2016  . Tympanic membrane perforation, right 03/10/2016   Past Medical History:  Diagnosis Date  . GERD (gastroesophageal reflux disease)   . Hypothyroid    History reviewed. No pertinent family history. History reviewed. No pertinent surgical history. Social History   Occupational History  . Not on file  Tobacco Use  . Smoking status: Never Smoker  . Smokeless tobacco: Never Used  Substance and Sexual Activity  . Alcohol use: Yes    Alcohol/week: 0.6 oz    Types: 1 Glasses of wine per week  Comment: daily  . Drug use: Not on file  . Sexual activity: Not on file

## 2017-03-19 DIAGNOSIS — K120 Recurrent oral aphthae: Secondary | ICD-10-CM | POA: Diagnosis not present

## 2017-03-19 DIAGNOSIS — B349 Viral infection, unspecified: Secondary | ICD-10-CM | POA: Diagnosis not present

## 2017-03-20 DIAGNOSIS — M4726 Other spondylosis with radiculopathy, lumbar region: Secondary | ICD-10-CM | POA: Diagnosis not present

## 2017-03-20 DIAGNOSIS — G5601 Carpal tunnel syndrome, right upper limb: Secondary | ICD-10-CM | POA: Diagnosis not present

## 2017-03-20 DIAGNOSIS — M4316 Spondylolisthesis, lumbar region: Secondary | ICD-10-CM | POA: Diagnosis not present

## 2017-03-20 DIAGNOSIS — M48062 Spinal stenosis, lumbar region with neurogenic claudication: Secondary | ICD-10-CM | POA: Diagnosis not present

## 2017-04-18 DIAGNOSIS — H90A32 Mixed conductive and sensorineural hearing loss, unilateral, left ear with restricted hearing on the contralateral side: Secondary | ICD-10-CM | POA: Diagnosis not present

## 2017-04-18 DIAGNOSIS — Z9621 Cochlear implant status: Secondary | ICD-10-CM | POA: Diagnosis not present

## 2017-04-18 DIAGNOSIS — Z48810 Encounter for surgical aftercare following surgery on the sense organs: Secondary | ICD-10-CM | POA: Diagnosis not present

## 2017-05-15 ENCOUNTER — Telehealth (INDEPENDENT_AMBULATORY_CARE_PROVIDER_SITE_OTHER): Payer: Self-pay | Admitting: Physical Medicine and Rehabilitation

## 2017-05-15 NOTE — Telephone Encounter (Signed)
Bilateral L5 TF

## 2017-05-15 NOTE — Telephone Encounter (Signed)
Scheduled for 05/30/17 at 1430 with driver and no BTS.

## 2017-05-16 DIAGNOSIS — Z461 Encounter for fitting and adjustment of hearing aid: Secondary | ICD-10-CM | POA: Diagnosis not present

## 2017-05-22 ENCOUNTER — Ambulatory Visit (INDEPENDENT_AMBULATORY_CARE_PROVIDER_SITE_OTHER): Payer: Medicare Other | Admitting: Orthopedic Surgery

## 2017-05-22 ENCOUNTER — Encounter (INDEPENDENT_AMBULATORY_CARE_PROVIDER_SITE_OTHER): Payer: Self-pay | Admitting: Orthopedic Surgery

## 2017-05-22 DIAGNOSIS — M1711 Unilateral primary osteoarthritis, right knee: Secondary | ICD-10-CM | POA: Diagnosis not present

## 2017-05-23 ENCOUNTER — Encounter (INDEPENDENT_AMBULATORY_CARE_PROVIDER_SITE_OTHER): Payer: Self-pay | Admitting: Orthopedic Surgery

## 2017-05-23 NOTE — Progress Notes (Signed)
Office Visit Note   Patient: [name]           Date of Birth: 1951/01/03           MRN: [id number] Visit Date: 05/22/2017 Requested by: [facility] [address] [place], [place] [address] PCP: [facility]  Subjective: Chief Complaint  Patient presents with  . Right Knee - Pain    HPI: [name] is a patient with known right knee arthritis.  She is considering an injection but wants to discuss surgical intervention.  Her symptoms have been gradually worsening over the past several years.  She has a family reunion coming up the first weekend in August.  She also has epidural steroid injection in her back next week.  She also describes atraumatic onset of right elbow bruising although she was moving things over the past several days which caused potentially some symptoms in the elbow.  In regards to the right knee she reports continued pain with ambulation as well as some degree of rest pain.  These have been relieved temporarily with injections in the past but that timeframe of relief has diminished.              ROS: All systems reviewed are negative as they relate to the chief complaint within the history of present illness.  Patient denies  fevers or chills.   Assessment & Plan: Visit Diagnoses:  1. Unilateral primary osteoarthritis, right knee     Plan: Impression is right knee pain and arthritis.  Discussed at length today total knee replacement including rehab risks and benefits expected recovery time outcomes.  She is going to consider that option and let me know if she wants to get that scheduled.  She would need to get it done sooner rather than later if she wants to go to and be functional for her family reunion in August.  In regards to the right elbow she has good range of motion but does have some bruising.  Triceps function is intact.  Is not particular painful.  No limitation of motion.  I think that  something we can watch for now.  I will see her back as needed.  Follow-Up Instructions: Return if symptoms worsen or fail to improve.   Orders:  No orders of the defined types were placed in this encounter.  No orders of the defined types were placed in this encounter.     Procedures: No procedures performed   Clinical Data: No additional findings.  Objective: Vital Signs: There were no vitals taken for this visit.  Physical Exam:   Constitutional: Patient appears well-developed HEENT:  Head: Normocephalic Eyes:EOM are normal Neck: Normal range of motion Cardiovascular: Normal rate Pulmonary/chest: Effort normal Neurologic: Patient is alert Skin: Skin is warm Psychiatric: Patient has normal mood and affect    Ortho Exam: Orthopedic exam demonstrates full active and passive range of motion of the right elbow.  Motor or sensory function to the hand is intact.  There is some bruising posteriorly measuring about 4 x 4 cm.  There is a slight amount of fluid in the olecranon bursa but it is not indurated or warm.  Full pronation supination is present.  Pronator supinator strength is intact.  Biceps and triceps strength is intact.  Examination of the right knee demonstrates pretty good range of motion from just shy of full extension to over 110 of flexion.  Collateral and cruciate ligaments are stable.  There is global tenderness in the knee including in the periretinacular region.  No groin pain with internal/external rotation of the leg.  Specialty Comments:  No specialty comments available.  Imaging: No results found.   PMFS History: Patient Active Problem List   Diagnosis Date Noted  . Mixed conductive and sensorineural hearing loss of left ear with restricted hearing of right ear 12/12/2016  . Acute laryngitis 03/10/2016  . Laryngopharyngeal reflux (LPR) 03/10/2016  . Tympanic membrane perforation, right 03/10/2016   Past Medical History:  Diagnosis Date  .  GERD (gastroesophageal reflux disease)   . Hypothyroid     History reviewed. No pertinent family history.  History reviewed. No pertinent surgical history. Social History   Occupational History  . Not on file  Tobacco Use  . Smoking status: Never Smoker  . Smokeless tobacco: Never Used  Substance and Sexual Activity  . Alcohol use: Yes    Alcohol/week: 0.6 oz    Types: 1 Glasses of wine per week    Comment: daily  . Drug use: Not on file  . Sexual activity: Not on file

## 2017-05-30 ENCOUNTER — Ambulatory Visit (INDEPENDENT_AMBULATORY_CARE_PROVIDER_SITE_OTHER): Payer: Medicare Other

## 2017-05-30 ENCOUNTER — Encounter (INDEPENDENT_AMBULATORY_CARE_PROVIDER_SITE_OTHER): Payer: Self-pay | Admitting: Physical Medicine and Rehabilitation

## 2017-05-30 ENCOUNTER — Ambulatory Visit (INDEPENDENT_AMBULATORY_CARE_PROVIDER_SITE_OTHER): Payer: Medicare Other | Admitting: Physical Medicine and Rehabilitation

## 2017-05-30 VITALS — BP 130/79 | HR 92 | Temp 98.0°F

## 2017-05-30 DIAGNOSIS — M5416 Radiculopathy, lumbar region: Secondary | ICD-10-CM

## 2017-05-30 DIAGNOSIS — M4316 Spondylolisthesis, lumbar region: Secondary | ICD-10-CM | POA: Diagnosis not present

## 2017-05-30 DIAGNOSIS — M48062 Spinal stenosis, lumbar region with neurogenic claudication: Secondary | ICD-10-CM | POA: Diagnosis not present

## 2017-05-30 MED ORDER — BETAMETHASONE SOD PHOS & ACET 6 (3-3) MG/ML IJ SUSP
12.0000 mg | Freq: Once | INTRAMUSCULAR | Status: AC
  Administered 2017-05-30: 12 mg

## 2017-05-30 NOTE — Patient Instructions (Signed)

## 2017-05-30 NOTE — Progress Notes (Signed)
Numeric Pain Rating Scale and Functional Assessment Average Pain 9   In the last MONTH (on 0-10 scale) has pain interfered with the following?  1. General activity like being  able to carry out your everyday physical activities such as walking, climbing stairs, carrying groceries, or moving a chair?  Rating(6)    +Driver, -BT, -Dye Allergies. 

## 2017-06-05 ENCOUNTER — Telehealth (INDEPENDENT_AMBULATORY_CARE_PROVIDER_SITE_OTHER): Payer: Self-pay | Admitting: Physical Medicine and Rehabilitation

## 2017-06-05 MED ORDER — HYDROCODONE-ACETAMINOPHEN 5-325 MG PO TABS: 1.0000 | ORAL_TABLET | Freq: Four times a day (QID) | ORAL | 0 refills | Status: DC | PRN

## 2017-06-05 NOTE — Telephone Encounter (Signed)
I sent 5 day rx to Pharm on record, if not getting better may need to follow with Dr. Louanne Skye or myself versus ED if new weakness etc, she has severe stenosis and likely needs surgery

## 2017-06-05 NOTE — Telephone Encounter (Signed)
Please advise 

## 2017-06-05 NOTE — Telephone Encounter (Signed)
Patient's spouse [name] walked into office stating patient [name] was given a injection by Dr Ernestina Patches on 05/30/17 and has been in severe pain for the past 3 days. The last 2 hours patient has been in extreme pain unable to walk. Patient request something for pain Norco 5-325mg . Pharmacy [facility] @ [address]. [name] states he did not leave voicemail message on [name] phone due to the nature and importance of the situation and he needed a quick response. If Dr Ernestina Patches can not refill medication, he would like for Dr Marlou Sa to take care of the matter. [name]'s call back # [phone].

## 2017-06-05 NOTE — Telephone Encounter (Signed)
Called patient's husband to advise.  

## 2017-06-05 NOTE — Progress Notes (Signed)
[name] - 67 y.o. female MRN [id number]  Date of birth: 04/09/1950  Office Visit Note: Visit Date: 05/30/2017 PCP: [name] Referred by: [name]  Subjective: Chief Complaint  Patient presents with  . Lower Back - Pain   HPI: [name] 67 year old female with known severe stenosis at L4-5.  She is a patient of Dr. Randel Barnes and Dr. Otho Barnes.  We saw her last February and completed bilateral L4 transforaminal epidural steroid injection with some relief of her symptoms which seems to be more pelvic pain, Pain in center of lower back. Sharp shooting pains at times "inside her vagina." Worse with sitting. Sits on a cushion. Denies any radiating pain into legs.  versus more radicular pain.  She has also been to see a neurosurgeon at [facility] for evaluation.  We are going to go ahead today and repeat the bilateral L4 transforaminal epidural steroid injection.  She likely is going to need lumbar decompression.   ROS Otherwise per HPI.  Assessment & Plan: Visit Diagnoses:  1. Lumbar radiculopathy   2. Spinal stenosis of lumbar region with neurogenic claudication   3. Spondylolisthesis of lumbar region     Plan: No additional findings.   Meds & Orders:  Meds ordered this encounter  Medications  . betamethasone acetate-betamethasone sodium phosphate (CELESTONE) injection 12 mg    Orders Placed This Encounter  Procedures  . XR C-ARM NO REPORT  . Epidural Steroid injection    Follow-up: Return if symptoms worsen or fail to improve.   Procedures: No procedures performed  Lumbosacral Transforaminal Epidural Steroid Injection - Sub-Pedicular Approach with Fluoroscopic Guidance  Patient: [name]      Date of Birth: 1950/12/02 MRN: [id number] PCP: [name]      Visit Date: 05/30/2017   Universal Protocol:    Date/Time: 05/30/2017  Consent Given By: the patient  Position: PRONE  Additional  Comments: Vital signs were monitored before and after the procedure. Patient was prepped and draped in the usual sterile fashion. The correct patient, procedure, and site was verified.   Injection Procedure Details:  Procedure Site One Meds Administered:  Meds ordered this encounter  Medications  . betamethasone acetate-betamethasone sodium phosphate (CELESTONE) injection 12 mg    Laterality: Bilateral  Location/Site:  L4-L5  Needle size: 22 G  Needle type: Spinal  Needle Placement: Transforaminal  Findings:    -Comments: Excellent flow of contrast along the nerve and into the epidural space.  Procedure Details: After squaring off the end-plates to get a true AP view, the C-arm was positioned so that an oblique view of the foramen as noted above was visualized. The target area is just inferior to the "nose of the scotty dog" or sub pedicular. The soft tissues overlying this structure were infiltrated with 2-3 ml. of 1% Lidocaine without Epinephrine.  The spinal needle was inserted toward the target using a "trajectory" view along the fluoroscope beam.  Under AP and lateral visualization, the needle was advanced so it did not puncture dura and was located close the 6 O'Clock position of the pedical in AP tracterory. Biplanar projections were used to confirm position. Aspiration was confirmed to be negative for CSF and/or blood. A 1-2 ml. volume of Isovue-250 was injected and flow of contrast was noted at each level. Radiographs were obtained for documentation purposes.   After attaining the desired flow of contrast documented above, a 0.5 to 1.0 ml test dose of  0.25% Marcaine was injected into each respective transforaminal space.  The patient was observed for 90 seconds post injection.  After no sensory deficits were reported, and normal lower extremity motor function was noted,   the above injectate was administered so that equal amounts of the injectate were placed at each foramen  (level) into the transforaminal epidural space.   Additional Comments:  The patient tolerated the procedure well Dressing: Band-Aid    Post-procedure details: Patient was observed during the procedure. Post-procedure instructions were reviewed.  Patient left the clinic in stable condition.    Clinical History: MRI LUMBAR SPINE WITHOUT CONTRAST  TECHNIQUE: Multiplanar, multisequence MR imaging of the lumbar spine was performed. No intravenous contrast was administered.  COMPARISON:  None.  FINDINGS: Segmentation:  Standard.  Alignment: 5 mm anterolisthesis L4-5. Trace up to 2 mm compensatory retrolisthesis at other levels.  Vertebrae: No worrisome osseous lesion. Endplate reactive changes related to disc disease at L1-2, L2-3, and L3-4.  Conus medullaris: Extends to the L1 level and appears normal.  Paraspinal and other soft tissues: Unremarkable.  Disc levels:  L1-L2: Trace retrolisthesis. Advanced disc space narrowing. Osseous spurring. Posterior element hypertrophy. Mild stenosis. No definite L2 nerve root impingement.  L2-L3: Trace retrolisthesis. Disc space narrowing. Central protrusion. Posterior element hypertrophy. Mild stenosis. No definite L3 nerve root impingement.  L3-L4: Trace retrolisthesis. Disc space narrowing. Central protrusion. Posterior element hypertrophy. Mild stenosis. Borderline L4 nerve root impingement.  L4-L5: 5 mm anterolisthesis. Disc space narrowing. Central protrusion. Posterior element hypertrophy. Severe stenosis. LEFT greater than RIGHT L5 nerve root impingement.  L5-S1: Disc space narrowing. 2 mm retrolisthesis. Central protrusion. Posterior element hypertrophy. Mild to moderate stenosis. Subarticular zone and foraminal zone narrowing affect the L5 and S1 nerve roots.  IMPRESSION: Severe stenosis at L4-5, LEFT greater than RIGHT related to 5 mm anterolisthesis, central protrusion and posterior  element hypertrophy. BILATERAL L5 nerve root impingement is noted.  Similar less severe changes at L5-S1, 2 mm retrolisthesis, central protrusion and posterior element hypertrophy. Due to foraminal narrowing and subarticular zone narrowing, this could affect the L5 and S1 nerve roots.   Electronically Signed   By: Staci Righter M.D.   On: 11/15/2016 17:18   She reports that she has never smoked. She has never used smokeless tobacco. No results for input(s): HGBA1C, LABURIC in the last 8760 hours.  Objective:  VS:  HT:    WT:   BMI:     BP:130/79  HR:92bpm  TEMP:98 F (36.7 C)(Oral)  RESP:100 % Physical Exam  Musculoskeletal:  They stand and ambulate with a forward flexed lumbar spine.  There is low back pain with extension of the lumbar spine.  There is good distal strength.    Ortho Exam Imaging: No results found.  Past Medical/Family/Surgical/Social History: Medications & Allergies reviewed per EMR, new medications updated. Patient Active Problem List   Diagnosis Date Noted  . Mixed conductive and sensorineural hearing loss of left ear with restricted hearing of right ear 12/12/2016  . Acute laryngitis 03/10/2016  . Laryngopharyngeal reflux (LPR) 03/10/2016  . Tympanic membrane perforation, right 03/10/2016   Past Medical History:  Diagnosis Date  . GERD (gastroesophageal reflux disease)   . Hypothyroid    No family history on file. No past surgical history on file. Social History   Occupational History  . Not on file  Tobacco Use  . Smoking status: Never Smoker  . Smokeless tobacco: Never Used  Substance and Sexual Activity  . Alcohol use: Yes  Alcohol/week: 0.6 oz    Types: 1 Glasses of wine per week    Comment: daily  . Drug use: Not on file  . Sexual activity: Not on file

## 2017-06-05 NOTE — Procedures (Signed)
Lumbosacral Transforaminal Epidural Steroid Injection - Sub-Pedicular Approach with Fluoroscopic Guidance  Patient: [name]      Date of Birth: Feb 12, 1950 MRN: [id number] PCP: [facility]      Visit Date: 05/30/2017   Universal Protocol:    Date/Time: 05/30/2017  Consent Given By: the patient  Position: PRONE  Additional Comments: Vital signs were monitored before and after the procedure. Patient was prepped and draped in the usual sterile fashion. The correct patient, procedure, and site was verified.   Injection Procedure Details:  Procedure Site One Meds Administered:  Meds ordered this encounter  Medications  . betamethasone acetate-betamethasone sodium phosphate (CELESTONE) injection 12 mg    Laterality: Bilateral  Location/Site:  L4-L5  Needle size: 22 G  Needle type: Spinal  Needle Placement: Transforaminal  Findings:    -Comments: Excellent flow of contrast along the nerve and into the epidural space.  Procedure Details: After squaring off the end-plates to get a true AP view, the C-arm was positioned so that an oblique view of the foramen as noted above was visualized. The target area is just inferior to the "nose of the scotty dog" or sub pedicular. The soft tissues overlying this structure were infiltrated with 2-3 ml. of 1% Lidocaine without Epinephrine.  The spinal needle was inserted toward the target using a "trajectory" view along the fluoroscope beam.  Under AP and lateral visualization, the needle was advanced so it did not puncture dura and was located close the 6 O'Clock position of the pedical in AP tracterory. Biplanar projections were used to confirm position. Aspiration was confirmed to be negative for CSF and/or blood. A 1-2 ml. volume of Isovue-250 was injected and flow of contrast was noted at each level. Radiographs were obtained for documentation purposes.   After attaining the desired flow of contrast  documented above, a 0.5 to 1.0 ml test dose of 0.25% Marcaine was injected into each respective transforaminal space.  The patient was observed for 90 seconds post injection.  After no sensory deficits were reported, and normal lower extremity motor function was noted,   the above injectate was administered so that equal amounts of the injectate were placed at each foramen (level) into the transforaminal epidural space.   Additional Comments:  The patient tolerated the procedure well Dressing: Band-Aid    Post-procedure details: Patient was observed during the procedure. Post-procedure instructions were reviewed.  Patient left the clinic in stable condition.

## 2017-06-08 ENCOUNTER — Encounter (INDEPENDENT_AMBULATORY_CARE_PROVIDER_SITE_OTHER): Payer: Self-pay | Admitting: Specialist

## 2017-06-08 ENCOUNTER — Ambulatory Visit (INDEPENDENT_AMBULATORY_CARE_PROVIDER_SITE_OTHER): Payer: Medicare Other | Admitting: Specialist

## 2017-06-08 VITALS — BP 118/66 | HR 76 | Ht 62.0 in | Wt 184.0 lb

## 2017-06-08 DIAGNOSIS — M1711 Unilateral primary osteoarthritis, right knee: Secondary | ICD-10-CM

## 2017-06-08 DIAGNOSIS — M48062 Spinal stenosis, lumbar region with neurogenic claudication: Secondary | ICD-10-CM | POA: Diagnosis not present

## 2017-06-08 DIAGNOSIS — M4316 Spondylolisthesis, lumbar region: Secondary | ICD-10-CM

## 2017-06-08 DIAGNOSIS — M5136 Other intervertebral disc degeneration, lumbar region: Secondary | ICD-10-CM | POA: Diagnosis not present

## 2017-06-08 MED ORDER — HYDROCODONE-ACETAMINOPHEN 5-325 MG PO TABS: 1.0000 | ORAL_TABLET | Freq: Four times a day (QID) | ORAL | 0 refills | Status: AC | PRN

## 2017-06-08 MED ORDER — GABAPENTIN 300 MG PO CAPS: 300.0000 mg | ORAL_CAPSULE | Freq: Every day | ORAL | 1 refills | Status: DC

## 2017-06-08 NOTE — Patient Instructions (Signed)

## 2017-06-08 NOTE — Addendum Note (Signed)
Addended by: [name] on: 06/08/2017 06:09 PM   Modules accepted: Orders

## 2017-06-08 NOTE — Progress Notes (Addendum)
Office Visit Note   Patient: [name]           Date of Birth: 26-Mar-1950           MRN: [id number] Visit Date: 06/08/2017              Requested by: [facility] [address] [place], [place] [address] PCP: Pa, Lebam: Visit Diagnoses:  1. Spinal stenosis of lumbar region with neurogenic claudication   2. Spondylolisthesis, lumbar region   3. Degenerative disc disease, lumbar   4. Unilateral primary osteoarthritis, right knee   67 year old female with severe back pain and radiation into the legs with genital pain that is neurgenic quality. She has severe lumbar spinal stenosis L4-5 with grade one spondylolisthesis L4-5. There is degenerative disc disease at every lumbar segment L1-2 through L5-S1 but only mild spinal stenosis at all but the L4-5 level where the central canal stenosis is severe. The lowest caudle nerves are likely compressed in this area and cause the intermittant genital pain. She is having worsening symptoms of neurogenic claudication and genital pain that is due to central canal stenosis at L4-5. I discussed the diffuse degenerative changes in her  Lumbar spine with her and her husband. I am recommending that she undergo a  Lumbar decompression and fusion at the L4-5 level. The risks and benefits of the surgery discussed with this patient and her husband and she wishes to proceed. Surgical scheduling sheet filled out.  Over time the other levels may require intervention but presently only show mild stenosis findings.   Plan: Avoid bending, stooping and avoid lifting weights greater than 10 lbs. Avoid prolong standing and walking. Order for a new walker with wheels. Surgery scheduling secretary Kandice Hams, will call you in the next week to schedule for surgery.  Surgery recommended is a two level lumbar fusion L4-5 this would be done with rods, screws and cages with local  bone graft and allograft (donor bone graft). Take hydrocodone for for pain. Risk of surgery includes risk of infection 1 in 200 patients, bleeding 1/2% chance you would need a transfusion.   Risk to the nerves is one in 10,000. You will need to use a brace for 3 months and wean from the brace on the 4th month. Expect improved walking and standing tolerance. Expect relief of leg pain but numbness may persist depending on the length and degree of pressure that has been present  Follow-Up Instructions: Return in about 1 month (around 07/06/2017).   Orders:  No orders of the defined types were placed in this encounter.  Meds ordered this encounter  Medications  . gabapentin (NEURONTIN) 300 MG capsule    Sig: Take 1 capsule (300 mg total) by mouth at bedtime.    Dispense:  30 capsule    Refill:  1  . HYDROcodone-acetaminophen (NORCO/VICODIN) 5-325 MG tablet    Sig: Take 1 tablet by mouth every 6 (six) hours as needed for up to 5 days for moderate pain.    Dispense:  30 tablet    Refill:  0      Procedures: No procedures performed   Clinical Data: No additional findings.   Subjective: Chief Complaint  Patient presents with  . Lower Back - Follow-up    67 year old female with history of low back pain and pain that is worsening over the past 10 days. She has  a sharp knife like quality pain shooting up through her back. She has difficutly reaching her feet and can't even bend to reach into the dish washer. She reports calling her husband to come home Due to severe pain in the back and radiation into the back she thinks that she willl probably need surgery on the lumbar spine before considering her knee replacement scheduled for 6/6 with Dr. Marlou Sa. She is having pain with any standing less than one minute. She is limited in her walking to 100 feet at the most. She underwent 2 ESIs with no improvement in her funcitonal status. Pain is severe and it is radiating into the genitalia and  "vagina". She has undergone evaluation by her OB/Gyn specialist with pelvic ultrasound demonstrating no GYN source or pelvic source of the pain. The pattern of  Pain is worsened with standing and walking and improves with sitting. She is stooped in standing up and leain on grocery carts for support and to relieve her back pain. MRI of the lumbar spine shows DDD L1-2, L2-3 , L3-4 , L4-5 with spondylolisthesis and L5-S1, mild lumbar spinal stenosis L2-3, L3-4 and severe stenosis findings centrally at L4-5 with spondylolisthesis. She is now having pain at night and with any movement such that she  Spoke with Dr.Newton 5/20 and he prescribed hydrocodone for the discomfort.    Review of Systems   Objective: Vital Signs: BP 118/66 (BP Location: Left Arm, Patient Position: Sitting)   Pulse 76   Ht 5\' 2"  (1.575 m)   Wt 184 lb (83.5 kg)   BMI 33.65 kg/m   Physical Exam  Constitutional: She is oriented to person, place, and time. She appears well-developed and well-nourished.  HENT:  Head: Normocephalic and atraumatic.  Eyes: Pupils are equal, round, and reactive to light. EOM are normal.  Neck: Normal range of motion. Neck supple.  Pulmonary/Chest: Effort normal and breath sounds normal.  Abdominal: Soft. Bowel sounds are normal.  Neurological: She is alert and oriented to person, place, and time.  Skin: Skin is warm and dry.  Psychiatric: She has a normal mood and affect. Her behavior is normal. Judgment and thought content normal.    Back Exam   Tenderness  The patient is experiencing tenderness in the lumbar.  Range of Motion  Extension: abnormal  Flexion: abnormal       Specialty Comments:  No specialty comments available.  Imaging: No results found.   PMFS History: Patient Active Problem List   Diagnosis Date Noted  . Mixed conductive and sensorineural hearing loss of left ear with restricted hearing of right ear 12/12/2016  . Acute laryngitis 03/10/2016  .  Laryngopharyngeal reflux (LPR) 03/10/2016  . Tympanic membrane perforation, right 03/10/2016   Past Medical History:  Diagnosis Date  . GERD (gastroesophageal reflux disease)   . Hypothyroid     History reviewed. No pertinent family history.  History reviewed. No pertinent surgical history. Social History   Occupational History  . Not on file  Tobacco Use  . Smoking status: Never Smoker  . Smokeless tobacco: Never Used  Substance and Sexual Activity  . Alcohol use: Yes    Alcohol/week: 0.6 oz    Types: 1 Glasses of wine per week    Comment: daily  . Drug use: Not on file  . Sexual activity: Not on file

## 2017-06-13 ENCOUNTER — Inpatient Hospital Stay (HOSPITAL_COMMUNITY): Admission: RE | Admit: 2017-06-13 | Payer: Medicare Other | Source: Ambulatory Visit

## 2017-06-21 ENCOUNTER — Other Ambulatory Visit (INDEPENDENT_AMBULATORY_CARE_PROVIDER_SITE_OTHER): Payer: Self-pay | Admitting: Specialist

## 2017-06-21 MED ORDER — HYDROCODONE-ACETAMINOPHEN 5-325 MG PO TABS: ORAL_TABLET | ORAL | 0 refills | Status: DC

## 2017-06-21 NOTE — Telephone Encounter (Signed)
I called patient and advised sent for medical clearance prior to scheduling surgery.  Not received back.  I called PCP office and left voice mail with Hinton Dyer to follow up on status of clearance.  I advised patient that I would let her know when I hear back.

## 2017-06-21 NOTE — Telephone Encounter (Signed)
Patient wants to surgery soon.

## 2017-06-21 NOTE — Addendum Note (Signed)
Addended by: Minda Ditto, Alyse Low N on: 06/21/2017 11:25 AM   Modules accepted: Orders

## 2017-06-21 NOTE — Telephone Encounter (Signed)
Patient called asking for a refill on her hydrocodone and also wanted to schedule her surgery soon. If she could get a call back ay [phone]

## 2017-06-22 ENCOUNTER — Ambulatory Visit (HOSPITAL_COMMUNITY): Admission: RE | Admit: 2017-06-22 | Payer: Medicare Other | Source: Ambulatory Visit | Admitting: Orthopedic Surgery

## 2017-06-22 ENCOUNTER — Encounter (HOSPITAL_COMMUNITY): Admission: RE | Payer: Self-pay | Source: Ambulatory Visit

## 2017-06-22 SURGERY — ARTHROPLASTY, KNEE, TOTAL
Anesthesia: Spinal | Laterality: Right

## 2017-06-22 NOTE — Telephone Encounter (Signed)
I tried to call and advised that her rx was ready for pick up but her VM was full.  I will try again later

## 2017-06-22 NOTE — Telephone Encounter (Signed)
I called and advised her rx is ready for pick up

## 2017-06-26 ENCOUNTER — Telehealth (INDEPENDENT_AMBULATORY_CARE_PROVIDER_SITE_OTHER): Payer: Self-pay | Admitting: Orthopedic Surgery

## 2017-06-26 DIAGNOSIS — M549 Dorsalgia, unspecified: Secondary | ICD-10-CM | POA: Diagnosis not present

## 2017-06-26 DIAGNOSIS — E039 Hypothyroidism, unspecified: Secondary | ICD-10-CM | POA: Diagnosis not present

## 2017-06-26 DIAGNOSIS — J45909 Unspecified asthma, uncomplicated: Secondary | ICD-10-CM | POA: Diagnosis not present

## 2017-06-26 DIAGNOSIS — Z6834 Body mass index (BMI) 34.0-34.9, adult: Secondary | ICD-10-CM | POA: Diagnosis not present

## 2017-06-26 DIAGNOSIS — Z01818 Encounter for other preprocedural examination: Secondary | ICD-10-CM | POA: Diagnosis not present

## 2017-06-26 DIAGNOSIS — E785 Hyperlipidemia, unspecified: Secondary | ICD-10-CM | POA: Diagnosis not present

## 2017-06-26 NOTE — Telephone Encounter (Signed)
Can you please help me with this?

## 2017-06-26 NOTE — Telephone Encounter (Signed)
[name], from [facility] left a message stating that they are needing the surgical clearance form faxed over to their office.  The patient is currently in their office.  Fax [phone].  CB#[phone].  Thank you.

## 2017-06-26 NOTE — Telephone Encounter (Signed)
Clearance is for back surgery with Dr. Louanne Skye

## 2017-06-26 NOTE — Telephone Encounter (Signed)
Re-faxed.

## 2017-06-29 ENCOUNTER — Ambulatory Visit (INDEPENDENT_AMBULATORY_CARE_PROVIDER_SITE_OTHER): Payer: Medicare Other | Admitting: Surgery

## 2017-06-29 ENCOUNTER — Encounter (INDEPENDENT_AMBULATORY_CARE_PROVIDER_SITE_OTHER): Payer: Self-pay | Admitting: Surgery

## 2017-06-29 VITALS — BP 143/88 | HR 103 | Ht 62.0 in | Wt 184.0 lb

## 2017-06-29 DIAGNOSIS — M48062 Spinal stenosis, lumbar region with neurogenic claudication: Secondary | ICD-10-CM | POA: Diagnosis not present

## 2017-06-29 DIAGNOSIS — M4316 Spondylolisthesis, lumbar region: Secondary | ICD-10-CM

## 2017-06-29 NOTE — H&P (Signed)
[name] is an 67 y.o. female.   Chief Complaint: Back pain and lower extremity radiculopathy HPI: Patient with history of L4-5 stenosis and spondylolisthesis presents for preop evaluation.  Progressively worsening symptoms.  Failed conservative treatment.  Patient also has history of right knee arthritis and we are planning to inject while under anesthesia.  Past Medical History:  Diagnosis Date  . GERD (gastroesophageal reflux disease)   . Hypothyroid     No past surgical history on file.  No family history on file. Social History:  reports that she has never smoked. She has never used smokeless tobacco. She reports that she drinks about 0.6 oz of alcohol per week. Her drug history is not on file.  Allergies: No Known Allergies  No medications prior to admission.    No results found for this or any previous visit (from the past 48 hour(s)). No results found.  Review of Systems  Constitutional: Negative.   HENT: Negative.   Respiratory: Negative.   Cardiovascular: Negative.   Genitourinary: Negative.   Musculoskeletal: Positive for back pain.  Skin: Negative.   Neurological: Positive for tingling.  Psychiatric/Behavioral: Negative.     There were no vitals taken for this visit. Physical Exam  Constitutional: She is oriented to person, place, and time. She appears well-developed and well-nourished. No distress.  HENT:  Head: Normocephalic and atraumatic.  Eyes: Pupils are equal, round, and reactive to light. EOM are normal.  Neck: Normal range of motion.  Cardiovascular: Normal rate.  No murmur heard. Respiratory: Effort normal and breath sounds normal. No respiratory distress.  GI: Bowel sounds are normal. She exhibits no distension. There is no tenderness.  Musculoskeletal: She exhibits tenderness.  Neurological: She is alert and oriented to person, place, and time.  Skin: Skin is warm and dry.  Psychiatric: She has a normal mood and affect.      Assessment/Plan L4-5 stenosis low back pain and lower extremity radiculopathy.  Right knee arthritis.   We will proceed with Transforaminal lumbar interbody fusion right L4-5 with mPACT pedicle screws and rods, ProTi cage, local bone graft, allograft bone graft, Vivigen Right knee intra-articular injection as scheduled.  Surgical procedure along with  rehab/recovery time discussed.  All questions answered and wishes to proceed.  Benjiman Core, PA-C 06/29/2017, 3:24 PM

## 2017-06-29 NOTE — Progress Notes (Signed)
67 year old white female history L4-5 spinal stenosis and spondylolisthesis presents for preop evaluation.  Progressive worsening symptoms.  Failed conservative treatment.  Full history and physical placed in patient's chart.

## 2017-06-29 NOTE — Pre-Procedure Instructions (Signed)
[name]  06/29/2017      [id number] - [place], [place] [address] AT [address] [place] [address] Phone: [phone] Fax: [phone]    Your procedure is scheduled on   Monday 07/03/17  Report to [facility] Admitting at 530 A.M.  Call this number if you have problems the morning of surgery:  [phone]   Remember:  Do not eat or drink after midnight.     Take these medicines the morning of surgery with A SIP OF WATER-  HYDROCODONE IF NEEDED, LEVOTHYROXINE  7 days prior to surgery STOP taking any Aspirin(unless otherwise instructed by your surgeon), Aleve, Naproxen, Ibuprofen, Motrin, Advil, Goody's, BC's, all herbal medications, fish oil, and all vitamins, CBD OIL    Do not wear jewelry, make-up or nail polish.  Do not wear lotions, powders, or perfumes, or deodorant.  Do not shave 48 hours prior to surgery.  Men may shave face and neck.  Do not bring valuables to the hospital.  [facility] is not responsible for any belongings or valuables.  Contacts, dentures or bridgework may not be worn into surgery.  Leave your suitcase in the car.  After surgery it may be brought to your room.  For patients admitted to the hospital, discharge time will be determined by your treatment team.  Patients discharged the day of surgery will not be allowed to drive home.   Name and phone number of your driver:    Special instructions:  [facility] - Preparing for Surgery  Before surgery, you can play an important role.  Because skin is not sterile, your skin needs to be as free of germs as possible.  You can reduce the number of germs on you skin by washing with CHG (chlorahexidine gluconate) soap before surgery.  CHG is an antiseptic cleaner which kills germs and bonds with the skin to continue killing germs even after washing.  Oral Hygiene is also important in reducing the risk of infection.  Remember  to brush your teeth with your regular toothpaste the morning of surgery.  Please DO NOT use if you have an allergy to CHG or antibacterial soaps.  If your skin becomes reddened/irritated stop using the CHG and inform your nurse when you arrive at Short Stay.  Do not shave (including legs and underarms) for at least 48 hours prior to the first CHG shower.  You may shave your face.  Please follow these instructions carefully:   1.  Shower with CHG Soap the night before surgery and the morning of Surgery.  2.  If you choose to wash your hair, wash your hair first as usual with your normal shampoo.  3.  After you shampoo, rinse your hair and body thoroughly to remove the shampoo. 4.  Use CHG as you would any other liquid soap.  You can apply chg directly to the skin and wash gently with a      scrungie or washcloth.           5.  Apply the CHG Soap to your body ONLY FROM THE NECK DOWN.   Do not use on open wounds or open sores. Avoid contact with your eyes, ears, mouth and genitals (private parts).  Wash genitals (private parts) with your normal soap.  6.  Wash thoroughly, paying special attention to the area where your surgery will be performed.  7.  Thoroughly rinse your body with warm  water from the neck down.  8.  DO NOT shower/wash with your normal soap after using and rinsing off the CHG Soap.  9.  Pat yourself dry with a clean towel.            10.  Wear clean pajamas.            11.  Place clean sheets on your bed the night of your first shower and do not sleep with pets.  Day of Surgery  Do not apply any lotions/deoderants the morning of surgery.   Please wear clean clothes to the hospital/surgery center. Remember to brush your teeth with toothpaste.     Please read over the following fact sheets that you were given. MRSA Information and Surgical Site Infection Prevention

## 2017-06-30 ENCOUNTER — Encounter (HOSPITAL_COMMUNITY)
Admission: RE | Admit: 2017-06-30 | Discharge: 2017-06-30 | Disposition: A | Discharge status: Home / Self Care | Payer: Medicare Other | Source: Ambulatory Visit | Attending: Specialist | Admitting: Specialist

## 2017-06-30 ENCOUNTER — Other Ambulatory Visit: Payer: Self-pay

## 2017-06-30 ENCOUNTER — Encounter (HOSPITAL_COMMUNITY): Payer: Self-pay

## 2017-06-30 HISTORY — DX: Asymptomatic varicose veins of unspecified lower extremity: I83.90

## 2017-06-30 LAB — URINALYSIS, ROUTINE W REFLEX MICROSCOPIC
BACTERIA UA: NONE SEEN
BILIRUBIN URINE: NEGATIVE
Glucose, UA: NEGATIVE mg/dL
Hgb urine dipstick: NEGATIVE
Ketones, ur: NEGATIVE mg/dL
Nitrite: NEGATIVE
PROTEIN: NEGATIVE mg/dL
Specific Gravity, Urine: 1.017 (ref 1.005–1.030)
pH: 5 (ref 5.0–8.0)

## 2017-06-30 LAB — COMPREHENSIVE METABOLIC PANEL
ALK PHOS: 49 U/L (ref 38–126)
ALT: 20 U/L (ref 14–54)
ANION GAP: 7 (ref 5–15)
AST: 22 U/L (ref 15–41)
Albumin: 4 g/dL (ref 3.5–5.0)
BILIRUBIN TOTAL: 0.7 mg/dL (ref 0.3–1.2)
BUN: 15 mg/dL (ref 6–20)
CALCIUM: 9.6 mg/dL (ref 8.9–10.3)
CO2: 24 mmol/L (ref 22–32)
Chloride: 109 mmol/L (ref 101–111)
Creatinine, Ser: 0.87 mg/dL (ref 0.44–1.00)
GFR calc non Af Amer: >60 mL/min (ref >60)
Glucose, Bld: 92 mg/dL (ref 65–99)
Potassium: 4.4 mmol/L (ref 3.5–5.1)
SODIUM: 140 mmol/L (ref 135–145)
TOTAL PROTEIN: 7 g/dL (ref 6.5–8.1)

## 2017-06-30 LAB — PROTIME-INR
INR: 0.92
Prothrombin Time: 12.3 seconds (ref 11.4–15.2)

## 2017-06-30 LAB — ABO/RH: ABO/RH(D): O NEG

## 2017-06-30 LAB — SURGICAL PCR SCREEN
MRSA, PCR: NEGATIVE
STAPHYLOCOCCUS AUREUS: POSITIVE — AB

## 2017-06-30 LAB — APTT: aPTT: 26 seconds (ref 24–36)

## 2017-06-30 LAB — TYPE AND SCREEN
ABO/RH(D): O NEG
ANTIBODY SCREEN: NEGATIVE

## 2017-06-30 MED ORDER — CEFAZOLIN SODIUM-DEXTROSE 2-4 GM/100ML-% IV SOLN
2.0000 g | INTRAVENOUS | Status: AC
  Administered 2017-07-03 (×2): 2 g via INTRAVENOUS
  Filled 2017-06-30 (×2): qty 100

## 2017-06-30 MED ORDER — GABAPENTIN 300 MG PO CAPS: 300.0000 mg | ORAL_CAPSULE | Freq: Every day | ORAL | 0 refills | Status: DC

## 2017-06-30 NOTE — Progress Notes (Signed)
PCP - Dr. Leeroy Cha Cardiologist - patient denies  Chest x-ray - N/A EKG - 06/26/2017-requested from PCP Stress Test - patient denies ECHO - patient denies Cardiac Cath - patient denies  Sleep Study - patient denies  Blood Thinner Instructions: ASA 81mg -stopped  Aspirin Instructions: Pt aware to stop taking ASA at this time.  Anesthesia review: N/A   Patient denies shortness of breath, fever, cough and chest pain at PAT appointment   Patient verbalized understanding of instructions that were given to them at the PAT appointment. Patient was also instructed that they will need to review over the PAT instructions again at home before surgery.

## 2017-06-30 NOTE — Addendum Note (Signed)
Addended by: Brand Males E on: 06/30/2017 02:18 PM   Modules accepted: Orders

## 2017-07-02 NOTE — Anesthesia Preprocedure Evaluation (Addendum)
Anesthesia Evaluation  Patient identified by MRN, date of birth, ID band Patient awake    Reviewed: Allergy & Precautions, H&P , NPO status , Patient's Chart, lab work & pertinent test results  Airway Mallampati: III  TM Distance: >3 FB Neck ROM: Full    Dental no notable dental hx. (+) Teeth Intact, Dental Advisory Given   Pulmonary neg pulmonary ROS,    Pulmonary exam normal breath sounds clear to auscultation       Cardiovascular Exercise Tolerance: Good negative cardio ROS   Rhythm:Regular Rate:Normal     Neuro/Psych negative neurological ROS  negative psych ROS   GI/Hepatic Neg liver ROS, GERD  Controlled,  Endo/Other  Hypothyroidism   Renal/GU negative Renal ROS  negative genitourinary   Musculoskeletal   Abdominal   Peds  Hematology negative hematology ROS (+)   Anesthesia Other Findings   Reproductive/Obstetrics negative OB ROS                            Anesthesia Physical Anesthesia Plan  ASA: II  Anesthesia Plan: General   Post-op Pain Management:    Induction: Intravenous  PONV Risk Score and Plan: 4 or greater and Ondansetron, Dexamethasone and Midazolam  Airway Management Planned: Oral ETT  Additional Equipment:   Intra-op Plan:   Post-operative Plan: Extubation in OR  Informed Consent: I have reviewed the patients History and Physical, chart, labs and discussed the procedure including the risks, benefits and alternatives for the proposed anesthesia with the patient or authorized representative who has indicated his/her understanding and acceptance.   Dental advisory given  Plan Discussed with: CRNA  Anesthesia Plan Comments:         Anesthesia Quick Evaluation

## 2017-07-03 ENCOUNTER — Inpatient Hospital Stay (HOSPITAL_COMMUNITY): Payer: Medicare Other

## 2017-07-03 ENCOUNTER — Other Ambulatory Visit: Payer: Self-pay

## 2017-07-03 ENCOUNTER — Inpatient Hospital Stay (HOSPITAL_COMMUNITY)
Admission: RE | Admit: 2017-07-03 | Discharge: 2017-07-06 | DRG: 460 | Disposition: A | Discharge status: Home Health | Payer: Medicare Other | Source: Ambulatory Visit | Attending: Specialist | Admitting: Specialist

## 2017-07-03 ENCOUNTER — Inpatient Hospital Stay (HOSPITAL_COMMUNITY): Payer: Medicare Other | Admitting: Certified Registered"

## 2017-07-03 ENCOUNTER — Encounter (HOSPITAL_COMMUNITY)
Admission: RE | Disposition: A | Discharge status: Home Health | Payer: Self-pay | Source: Ambulatory Visit | Attending: Specialist

## 2017-07-03 ENCOUNTER — Encounter (HOSPITAL_COMMUNITY): Payer: Self-pay | Admitting: Anesthesiology

## 2017-07-03 DIAGNOSIS — H9192 Unspecified hearing loss, left ear: Secondary | ICD-10-CM | POA: Diagnosis present

## 2017-07-03 DIAGNOSIS — J040 Acute laryngitis: Secondary | ICD-10-CM | POA: Diagnosis not present

## 2017-07-03 DIAGNOSIS — Z419 Encounter for procedure for purposes other than remedying health state, unspecified: Secondary | ICD-10-CM

## 2017-07-03 DIAGNOSIS — M4316 Spondylolisthesis, lumbar region: Secondary | ICD-10-CM

## 2017-07-03 DIAGNOSIS — Z79890 Hormone replacement therapy (postmenopausal): Secondary | ICD-10-CM

## 2017-07-03 DIAGNOSIS — M1711 Unilateral primary osteoarthritis, right knee: Secondary | ICD-10-CM | POA: Diagnosis present

## 2017-07-03 DIAGNOSIS — D500 Iron deficiency anemia secondary to blood loss (chronic): Secondary | ICD-10-CM | POA: Diagnosis not present

## 2017-07-03 DIAGNOSIS — Z981 Arthrodesis status: Secondary | ICD-10-CM

## 2017-07-03 DIAGNOSIS — M48061 Spinal stenosis, lumbar region without neurogenic claudication: Secondary | ICD-10-CM | POA: Diagnosis not present

## 2017-07-03 DIAGNOSIS — M48062 Spinal stenosis, lumbar region with neurogenic claudication: Secondary | ICD-10-CM | POA: Diagnosis not present

## 2017-07-03 DIAGNOSIS — M549 Dorsalgia, unspecified: Secondary | ICD-10-CM | POA: Diagnosis present

## 2017-07-03 DIAGNOSIS — Z7982 Long term (current) use of aspirin: Secondary | ICD-10-CM

## 2017-07-03 DIAGNOSIS — M4326 Fusion of spine, lumbar region: Secondary | ICD-10-CM | POA: Diagnosis not present

## 2017-07-03 DIAGNOSIS — E039 Hypothyroidism, unspecified: Secondary | ICD-10-CM | POA: Diagnosis present

## 2017-07-03 DIAGNOSIS — D62 Acute posthemorrhagic anemia: Secondary | ICD-10-CM | POA: Diagnosis not present

## 2017-07-03 DIAGNOSIS — K219 Gastro-esophageal reflux disease without esophagitis: Secondary | ICD-10-CM | POA: Diagnosis not present

## 2017-07-03 HISTORY — DX: Unspecified hearing loss, unspecified ear: H91.90

## 2017-07-03 HISTORY — PX: INJECTION KNEE: SHX2446

## 2017-07-03 SURGERY — POSTERIOR LUMBAR FUSION 1 LEVEL
Anesthesia: General | Site: Knee | Laterality: Right

## 2017-07-03 MED ORDER — ACETAMINOPHEN 650 MG RE SUPP: 650.0000 mg | RECTAL | Status: DC | PRN

## 2017-07-03 MED ORDER — BUPIVACAINE HCL 0.5 % IJ SOLN
INTRAMUSCULAR | Status: DC | PRN
  Administered 2017-07-03: 5 mL
  Administered 2017-07-03: 15 mL

## 2017-07-03 MED ORDER — PROPOFOL 10 MG/ML IV BOLUS
INTRAVENOUS | Status: DC | PRN
  Administered 2017-07-03: 150 mg via INTRAVENOUS

## 2017-07-03 MED ORDER — POLYETHYLENE GLYCOL 3350 17 G PO PACK: 17.0000 g | PACK | Freq: Every day | ORAL | Status: DC | PRN

## 2017-07-03 MED ORDER — HYDROMORPHONE HCL 2 MG/ML IJ SOLN
INTRAMUSCULAR | Status: AC
  Administered 2017-07-03: 14:00:00
  Filled 2017-07-03: qty 1

## 2017-07-03 MED ORDER — SODIUM CHLORIDE 0.9% FLUSH: 3.0000 mL | INTRAVENOUS | Status: DC | PRN

## 2017-07-03 MED ORDER — ACETAMINOPHEN 325 MG PO TABS
650.0000 mg | ORAL_TABLET | ORAL | Status: DC | PRN
  Administered 2017-07-06: 650 mg via ORAL
  Filled 2017-07-03: qty 2

## 2017-07-03 MED ORDER — GABAPENTIN 300 MG PO CAPS
ORAL_CAPSULE | ORAL | Status: AC
  Administered 2017-07-03: 300 mg via ORAL
  Filled 2017-07-03: qty 1

## 2017-07-03 MED ORDER — METHYLPREDNISOLONE ACETATE 40 MG/ML IJ SUSP
INTRAMUSCULAR | Status: DC | PRN
  Administered 2017-07-03: 40 mg

## 2017-07-03 MED ORDER — ONDANSETRON HCL 4 MG/2ML IJ SOLN
INTRAMUSCULAR | Status: DC | PRN
  Administered 2017-07-03: 4 mg via INTRAVENOUS

## 2017-07-03 MED ORDER — ROCURONIUM BROMIDE 50 MG/5ML IV SOLN
INTRAVENOUS | Status: AC
  Filled 2017-07-03: qty 1

## 2017-07-03 MED ORDER — DEXAMETHASONE SODIUM PHOSPHATE 10 MG/ML IJ SOLN
INTRAMUSCULAR | Status: DC | PRN
  Administered 2017-07-03: 10 mg via INTRAVENOUS

## 2017-07-03 MED ORDER — METHYLPREDNISOLONE ACETATE 40 MG/ML IJ SUSP
INTRAMUSCULAR | Status: AC
  Filled 2017-07-03: qty 1

## 2017-07-03 MED ORDER — ADULT MULTIVITAMIN W/MINERALS CH
1.0000 | ORAL_TABLET | Freq: Every day | ORAL | Status: DC
  Administered 2017-07-04 – 2017-07-06 (×3): 1 via ORAL
  Filled 2017-07-03 (×3): qty 1

## 2017-07-03 MED ORDER — SUGAMMADEX SODIUM 200 MG/2ML IV SOLN
INTRAVENOUS | Status: DC | PRN
  Administered 2017-07-03: 170.6 mg via INTRAVENOUS

## 2017-07-03 MED ORDER — ALUM & MAG HYDROXIDE-SIMETH 200-200-20 MG/5ML PO SUSP: 30.0000 mL | Freq: Four times a day (QID) | ORAL | Status: DC | PRN

## 2017-07-03 MED ORDER — ONDANSETRON HCL 4 MG/2ML IJ SOLN: 4.0000 mg | Freq: Four times a day (QID) | INTRAMUSCULAR | Status: DC | PRN

## 2017-07-03 MED ORDER — SODIUM CHLORIDE 0.9 % IV SOLN
250.0000 mL | INTRAVENOUS | Status: DC
  Administered 2017-07-03: 250 mL via INTRAVENOUS

## 2017-07-03 MED ORDER — BUPIVACAINE LIPOSOME 1.3 % IJ SUSP
20.0000 mL | Freq: Once | INTRAMUSCULAR | Status: DC
  Administered 2017-07-03: 266 mg
  Filled 2017-07-03: qty 20

## 2017-07-03 MED ORDER — ACETAMINOPHEN 500 MG PO TABS
500.0000 mg | ORAL_TABLET | Freq: Once | ORAL | Status: AC
  Administered 2017-07-03: 500 mg via ORAL

## 2017-07-03 MED ORDER — LIDOCAINE 2% (20 MG/ML) 5 ML SYRINGE
INTRAMUSCULAR | Status: AC
  Filled 2017-07-03: qty 5

## 2017-07-03 MED ORDER — MENTHOL 3 MG MT LOZG: 1.0000 | LOZENGE | OROMUCOSAL | Status: DC | PRN

## 2017-07-03 MED ORDER — SODIUM CHLORIDE 0.9 % IV SOLN
INTRAVENOUS | Status: DC
  Administered 2017-07-03: 17:00:00 via INTRAVENOUS

## 2017-07-03 MED ORDER — HYDROMORPHONE HCL 2 MG/ML IJ SOLN
0.3000 mg | INTRAMUSCULAR | Status: DC | PRN
  Administered 2017-07-03: 0.3 mg via INTRAVENOUS

## 2017-07-03 MED ORDER — FLEET ENEMA 7-19 GM/118ML RE ENEM: 1.0000 | ENEMA | Freq: Once | RECTAL | Status: DC | PRN

## 2017-07-03 MED ORDER — PHENYLEPHRINE 40 MCG/ML (10ML) SYRINGE FOR IV PUSH (FOR BLOOD PRESSURE SUPPORT)
PREFILLED_SYRINGE | INTRAVENOUS | Status: DC | PRN
  Administered 2017-07-03 (×5): 80 ug via INTRAVENOUS

## 2017-07-03 MED ORDER — FENTANYL CITRATE (PF) 250 MCG/5ML IJ SOLN
INTRAMUSCULAR | Status: DC | PRN
  Administered 2017-07-03: 50 ug via INTRAVENOUS
  Administered 2017-07-03: 100 ug via INTRAVENOUS
  Administered 2017-07-03 (×3): 50 ug via INTRAVENOUS

## 2017-07-03 MED ORDER — CEFAZOLIN SODIUM 1 G IJ SOLR
INTRAMUSCULAR | Status: AC
  Filled 2017-07-03: qty 20

## 2017-07-03 MED ORDER — PROPOFOL 10 MG/ML IV BOLUS
INTRAVENOUS | Status: AC
  Filled 2017-07-03: qty 20

## 2017-07-03 MED ORDER — ONDANSETRON HCL 4 MG/2ML IJ SOLN
INTRAMUSCULAR | Status: AC
  Filled 2017-07-03: qty 2

## 2017-07-03 MED ORDER — CEFAZOLIN SODIUM-DEXTROSE 2-4 GM/100ML-% IV SOLN
2.0000 g | Freq: Three times a day (TID) | INTRAVENOUS | Status: AC
  Administered 2017-07-03 – 2017-07-04 (×2): 2 g via INTRAVENOUS
  Filled 2017-07-03 (×2): qty 100

## 2017-07-03 MED ORDER — LIDOCAINE 2% (20 MG/ML) 5 ML SYRINGE
INTRAMUSCULAR | Status: DC | PRN
  Administered 2017-07-03: 100 mg via INTRAVENOUS

## 2017-07-03 MED ORDER — LEVOTHYROXINE SODIUM 88 MCG PO TABS
88.0000 ug | ORAL_TABLET | Freq: Every day | ORAL | Status: DC
  Administered 2017-07-04 – 2017-07-06 (×3): 88 ug via ORAL
  Filled 2017-07-03 (×3): qty 1

## 2017-07-03 MED ORDER — THROMBIN 20000 UNITS EX SOLR
CUTANEOUS | Status: AC
  Filled 2017-07-03: qty 20000

## 2017-07-03 MED ORDER — MORPHINE SULFATE (PF) 2 MG/ML IV SOLN
1.0000 mg | INTRAVENOUS | Status: DC | PRN
  Administered 2017-07-04 – 2017-07-05 (×2): 1 mg via INTRAVENOUS
  Filled 2017-07-03 (×2): qty 1

## 2017-07-03 MED ORDER — GABAPENTIN 300 MG PO CAPS
300.0000 mg | ORAL_CAPSULE | Freq: Once | ORAL | Status: AC
  Administered 2017-07-03: 300 mg via ORAL

## 2017-07-03 MED ORDER — SUGAMMADEX SODIUM 200 MG/2ML IV SOLN
INTRAVENOUS | Status: AC
  Filled 2017-07-03: qty 2

## 2017-07-03 MED ORDER — LACTATED RINGERS IV SOLN
INTRAVENOUS | Status: DC | PRN
  Administered 2017-07-03 (×3): via INTRAVENOUS

## 2017-07-03 MED ORDER — OXYCODONE HCL 5 MG PO TABS
5.0000 mg | ORAL_TABLET | ORAL | Status: DC | PRN
  Administered 2017-07-04 – 2017-07-05 (×5): 5 mg via ORAL
  Filled 2017-07-03 (×5): qty 1

## 2017-07-03 MED ORDER — PROGESTERONE MICRONIZED 100 MG PO CAPS
100.0000 mg | ORAL_CAPSULE | Freq: Every day | ORAL | Status: DC
  Administered 2017-07-05: 100 mg via ORAL
  Filled 2017-07-03 (×3): qty 1

## 2017-07-03 MED ORDER — BUPIVACAINE LIPOSOME 1.3 % IJ SUSP
INTRAMUSCULAR | Status: DC | PRN
  Administered 2017-07-03: 15 mL

## 2017-07-03 MED ORDER — FENTANYL CITRATE (PF) 250 MCG/5ML IJ SOLN
INTRAMUSCULAR | Status: AC
  Filled 2017-07-03: qty 5

## 2017-07-03 MED ORDER — VECURONIUM BROMIDE 10 MG IV SOLR
INTRAVENOUS | Status: AC
  Filled 2017-07-03: qty 10

## 2017-07-03 MED ORDER — ACETAMINOPHEN 500 MG PO TABS
ORAL_TABLET | ORAL | Status: AC
  Administered 2017-07-03: 500 mg via ORAL
  Filled 2017-07-03: qty 1

## 2017-07-03 MED ORDER — DEXAMETHASONE SODIUM PHOSPHATE 10 MG/ML IJ SOLN
INTRAMUSCULAR | Status: AC
  Filled 2017-07-03: qty 1

## 2017-07-03 MED ORDER — THROMBIN 20000 UNITS EX SOLR
CUTANEOUS | Status: DC | PRN
  Administered 2017-07-03: 20 mL via TOPICAL

## 2017-07-03 MED ORDER — ROCURONIUM BROMIDE 10 MG/ML (PF) SYRINGE
PREFILLED_SYRINGE | INTRAVENOUS | Status: DC | PRN
  Administered 2017-07-03: 20 mg via INTRAVENOUS
  Administered 2017-07-03: 10 mg via INTRAVENOUS
  Administered 2017-07-03: 50 mg via INTRAVENOUS
  Administered 2017-07-03: 20 mg via INTRAVENOUS

## 2017-07-03 MED ORDER — MIDAZOLAM HCL 5 MG/5ML IJ SOLN
INTRAMUSCULAR | Status: DC | PRN
Start: 2017-07-03 — End: 2017-07-03
  Administered 2017-07-03: 2 mg via INTRAVENOUS

## 2017-07-03 MED ORDER — 0.9 % SODIUM CHLORIDE (POUR BTL) OPTIME
TOPICAL | Status: DC | PRN
  Administered 2017-07-03: 1000 mL

## 2017-07-03 MED ORDER — MIDAZOLAM HCL 2 MG/2ML IJ SOLN
INTRAMUSCULAR | Status: AC
  Filled 2017-07-03: qty 2

## 2017-07-03 MED ORDER — CALCIUM CARBONATE-VITAMIN D 500-200 MG-UNIT PO TABS
1.0000 | ORAL_TABLET | Freq: Every day | ORAL | Status: DC
  Administered 2017-07-04 – 2017-07-06 (×3): 1 via ORAL
  Filled 2017-07-03 (×3): qty 1

## 2017-07-03 MED ORDER — BUPIVACAINE HCL (PF) 0.5 % IJ SOLN
INTRAMUSCULAR | Status: AC
  Filled 2017-07-03: qty 60

## 2017-07-03 MED ORDER — PHENYLEPHRINE HCL 10 MG/ML IJ SOLN
INTRAMUSCULAR | Status: DC | PRN
  Administered 2017-07-03: 25 ug/min via INTRAVENOUS

## 2017-07-03 MED ORDER — BISACODYL 5 MG PO TBEC
5.0000 mg | DELAYED_RELEASE_TABLET | Freq: Every day | ORAL | Status: DC | PRN
  Administered 2017-07-05: 5 mg via ORAL
  Filled 2017-07-03: qty 1

## 2017-07-03 MED ORDER — SODIUM CHLORIDE 0.9% FLUSH
3.0000 mL | Freq: Two times a day (BID) | INTRAVENOUS | Status: DC
  Administered 2017-07-03 – 2017-07-05 (×5): 3 mL via INTRAVENOUS

## 2017-07-03 MED ORDER — OXYCODONE HCL ER 10 MG PO T12A
10.0000 mg | EXTENDED_RELEASE_TABLET | Freq: Two times a day (BID) | ORAL | Status: DC
Start: 2017-07-03 — End: 2017-07-06
  Administered 2017-07-03 – 2017-07-06 (×6): 10 mg via ORAL
  Filled 2017-07-03 (×7): qty 1

## 2017-07-03 MED ORDER — HEMOSTATIC AGENTS (NO CHARGE) OPTIME
TOPICAL | Status: DC | PRN
  Administered 2017-07-03: 1 via TOPICAL

## 2017-07-03 MED ORDER — PHENYLEPHRINE 40 MCG/ML (10ML) SYRINGE FOR IV PUSH (FOR BLOOD PRESSURE SUPPORT)
PREFILLED_SYRINGE | INTRAVENOUS | Status: AC
  Filled 2017-07-03: qty 10

## 2017-07-03 MED ORDER — VECURONIUM BROMIDE 10 MG IV SOLR
INTRAVENOUS | Status: DC | PRN
  Administered 2017-07-03: 1 mg via INTRAVENOUS
  Administered 2017-07-03: 2 mg via INTRAVENOUS
  Administered 2017-07-03 (×2): 1 mg via INTRAVENOUS
  Administered 2017-07-03: 2 mg via INTRAVENOUS
  Administered 2017-07-03: 1 mg via INTRAVENOUS

## 2017-07-03 MED ORDER — OXYCODONE HCL 5 MG PO TABS
10.0000 mg | ORAL_TABLET | ORAL | Status: DC | PRN
  Filled 2017-07-03: qty 2

## 2017-07-03 MED ORDER — GABAPENTIN 300 MG PO CAPS
300.0000 mg | ORAL_CAPSULE | Freq: Every day | ORAL | Status: DC
  Administered 2017-07-03 – 2017-07-05 (×3): 300 mg via ORAL
  Filled 2017-07-03 (×3): qty 1

## 2017-07-03 MED ORDER — CHLORHEXIDINE GLUCONATE 4 % EX LIQD
60.0000 mL | Freq: Once | CUTANEOUS | Status: DC
  Administered 2017-07-03: 4 via TOPICAL

## 2017-07-03 MED ORDER — DOCUSATE SODIUM 100 MG PO CAPS
100.0000 mg | ORAL_CAPSULE | Freq: Two times a day (BID) | ORAL | Status: DC
  Administered 2017-07-03 – 2017-07-06 (×5): 100 mg via ORAL
  Filled 2017-07-03 (×6): qty 1

## 2017-07-03 MED ORDER — PHENOL 1.4 % MT LIQD: 1.0000 | OROMUCOSAL | Status: DC | PRN

## 2017-07-03 MED ORDER — METHOCARBAMOL 1000 MG/10ML IJ SOLN
500.0000 mg | Freq: Four times a day (QID) | INTRAVENOUS | Status: DC | PRN
  Filled 2017-07-03: qty 5

## 2017-07-03 MED ORDER — ASPIRIN EC 81 MG PO TBEC
81.0000 mg | DELAYED_RELEASE_TABLET | Freq: Every day | ORAL | Status: DC
  Administered 2017-07-04 – 2017-07-06 (×3): 81 mg via ORAL
  Filled 2017-07-03 (×3): qty 1

## 2017-07-03 MED ORDER — METHOCARBAMOL 500 MG PO TABS
500.0000 mg | ORAL_TABLET | Freq: Four times a day (QID) | ORAL | Status: DC | PRN
Start: 2017-07-03 — End: 2017-07-06
  Administered 2017-07-04 – 2017-07-05 (×3): 500 mg via ORAL
  Filled 2017-07-03 (×3): qty 1

## 2017-07-03 MED ORDER — ONDANSETRON HCL 4 MG PO TABS: 4.0000 mg | ORAL_TABLET | Freq: Four times a day (QID) | ORAL | Status: DC | PRN

## 2017-07-03 SURGICAL SUPPLY — 87 items
ADH SKN CLS APL DERMABOND .7 (GAUZE/BANDAGES/DRESSINGS) ×2
AGENT HMST MTR 8 SURGIFLO (HEMOSTASIS)
APL SKNCLS STERI-STRIP NONHPOA (GAUZE/BANDAGES/DRESSINGS) ×2
BENZOIN TINCTURE PRP APPL 2/3 (GAUZE/BANDAGES/DRESSINGS) ×2 IMPLANT
BLADE CLIPPER SURG (BLADE) IMPLANT
BONE CANC CHIPS 20CC PCAN1/4 (Bone Implant) ×4 IMPLANT
BONE VIVIGEN FORMABLE 5.4CC (Bone Implant) ×4 IMPLANT
BUR MATCHSTICK NEURO 3.0 LAGG (BURR) ×4 IMPLANT
BUR SABER RD CUTTING 3.0 (BURR) IMPLANT
BUR SABER RD CUTTING 3.0MM (BURR)
CHIPS CANC BONE 20CC PCAN1/4 (Bone Implant) ×2 IMPLANT
CLOSURE STERI-STRIP 1/2X4 (GAUZE/BANDAGES/DRESSINGS) ×1
CLSR STERI-STRIP ANTIMIC 1/2X4 (GAUZE/BANDAGES/DRESSINGS) ×1 IMPLANT
COVER BACK TABLE 80X110 HD (DRAPES) ×4 IMPLANT
COVER SURGICAL LIGHT HANDLE (MISCELLANEOUS) ×2 IMPLANT
DECANTER SPIKE VIAL GLASS SM (MISCELLANEOUS) ×4 IMPLANT
DERMABOND ADVANCED (GAUZE/BANDAGES/DRESSINGS) ×2
DERMABOND ADVANCED .7 DNX12 (GAUZE/BANDAGES/DRESSINGS) ×2 IMPLANT
DRAPE C-ARM 42X72 X-RAY (DRAPES) ×4 IMPLANT
DRAPE C-ARMOR (DRAPES) ×4 IMPLANT
DRAPE MICROSCOPE LEICA (MISCELLANEOUS) ×4 IMPLANT
DRAPE POUCH INSTRU U-SHP 10X18 (DRAPES) ×4 IMPLANT
DRAPE SURG 17X23 STRL (DRAPES) ×16 IMPLANT
DRSG MEPILEX BORDER 4X4 (GAUZE/BANDAGES/DRESSINGS) IMPLANT
DRSG MEPILEX BORDER 4X8 (GAUZE/BANDAGES/DRESSINGS) ×2 IMPLANT
DURAPREP 26ML APPLICATOR (WOUND CARE) ×6 IMPLANT
ELECT BLADE 4.0 EZ CLEAN MEGAD (MISCELLANEOUS) ×4
ELECT BLADE 6.5 EXT (BLADE) ×2 IMPLANT
ELECT CAUTERY BLADE 6.4 (BLADE) ×4 IMPLANT
ELECT REM PT RETURN 9FT ADLT (ELECTROSURGICAL) ×4
ELECTRODE BLDE 4.0 EZ CLN MEGD (MISCELLANEOUS) ×2 IMPLANT
ELECTRODE REM PT RTRN 9FT ADLT (ELECTROSURGICAL) ×2 IMPLANT
EVACUATOR 1/8 PVC DRAIN (DRAIN) IMPLANT
FLOSEAL 5ML (HEMOSTASIS) ×2 IMPLANT
GAUZE SPONGE 4X4 12PLY STRL (GAUZE/BANDAGES/DRESSINGS) ×2 IMPLANT
GLOVE BIOGEL PI IND STRL 6.5 (GLOVE) IMPLANT
GLOVE BIOGEL PI IND STRL 8 (GLOVE) ×2 IMPLANT
GLOVE BIOGEL PI INDICATOR 6.5 (GLOVE) ×4
GLOVE BIOGEL PI INDICATOR 8 (GLOVE) ×4
GLOVE ECLIPSE 9.0 STRL (GLOVE) ×6 IMPLANT
GLOVE ORTHO TXT STRL SZ7.5 (GLOVE) ×6 IMPLANT
GLOVE SURG 8.5 LATEX PF (GLOVE) ×6 IMPLANT
GLOVE SURG SS PI 7.0 STRL IVOR (GLOVE) ×4 IMPLANT
GLOVE SURG SS PI 7.5 STRL IVOR (GLOVE) ×8 IMPLANT
GOWN STRL REUS W/ TWL LRG LVL3 (GOWN DISPOSABLE) ×2 IMPLANT
GOWN STRL REUS W/TWL 2XL LVL3 (GOWN DISPOSABLE) ×8 IMPLANT
GOWN STRL REUS W/TWL LRG LVL3 (GOWN DISPOSABLE) ×12
GRAFT BNE CANC CHIPS 1-8 20CC (Bone Implant) IMPLANT
GRAFT BNE MATRIX VG FRMBL MD 5 (Bone Implant) IMPLANT
KIT BASIN OR (CUSTOM PROCEDURE TRAY) ×4 IMPLANT
KIT POSITION SURG JACKSON T1 (MISCELLANEOUS) ×4 IMPLANT
KIT TURNOVER KIT B (KITS) ×4 IMPLANT
NDL SPNL 18GX3.5 QUINCKE PK (NEEDLE) ×2 IMPLANT
NEEDLE 22X1 1/2 (OR ONLY) (NEEDLE) ×6 IMPLANT
NEEDLE SPNL 18GX3.5 QUINCKE PK (NEEDLE) IMPLANT
NS IRRIG 1000ML POUR BTL (IV SOLUTION) ×4 IMPLANT
PACK LAMINECTOMY ORTHO (CUSTOM PROCEDURE TRAY) ×4 IMPLANT
PAD ARMBOARD 7.5X6 YLW CONV (MISCELLANEOUS) ×10 IMPLANT
PATTIES SURGICAL .75X.75 (GAUZE/BANDAGES/DRESSINGS) ×2 IMPLANT
PATTIES SURGICAL 1X1 (DISPOSABLE) ×2 IMPLANT
ROD PRE BENT EXPEDIUM 35MM (Rod) ×4 IMPLANT
SCREW SET SINGLE INNER (Screw) ×8 IMPLANT
SCREW VIPER 7X45MM (Screw) ×2 IMPLANT
SCREW VIPER 7X50MM (Screw) ×6 IMPLANT
SPACER CONCORDE PRO 5D 9X9X23 (Spacer) ×2 IMPLANT
SPOGE SURGIFLO 8M (HEMOSTASIS)
SPONGE LAP 18X18 X RAY DECT (DISPOSABLE) ×2 IMPLANT
SPONGE SURGIFLO 8M (HEMOSTASIS) IMPLANT
SPONGE SURGIFOAM ABS GEL 100 (HEMOSTASIS) ×4 IMPLANT
SUT VIC AB 0 CT1 27 (SUTURE) ×4
SUT VIC AB 0 CT1 27XBRD ANBCTR (SUTURE) ×2 IMPLANT
SUT VIC AB 1 CTX 27 (SUTURE) ×2 IMPLANT
SUT VIC AB 1 CTX 36 (SUTURE) ×4
SUT VIC AB 1 CTX36XBRD ANBCTR (SUTURE) ×4 IMPLANT
SUT VIC AB 2-0 CT1 27 (SUTURE) ×4
SUT VIC AB 2-0 CT1 TAPERPNT 27 (SUTURE) ×2 IMPLANT
SUT VIC AB 3-0 X1 27 (SUTURE) ×4 IMPLANT
SYR 20CC LL (SYRINGE) ×4 IMPLANT
SYR CONTROL 10ML LL (SYRINGE) ×8 IMPLANT
TAP CANN VIPER2 DL 5.0 (TAP) ×2 IMPLANT
TAP CANN VIPER2 DL 6.0 (TAP) ×2 IMPLANT
TAP CANN VIPER2 DL 7.0 (TAP) ×2 IMPLANT
TOWEL GREEN STERILE (TOWEL DISPOSABLE) ×4 IMPLANT
TOWEL GREEN STERILE FF (TOWEL DISPOSABLE) ×4 IMPLANT
TRAY FOLEY MTR SLVR 16FR STAT (SET/KITS/TRAYS/PACK) ×4 IMPLANT
WATER STERILE IRR 1000ML POUR (IV SOLUTION) ×4 IMPLANT
YANKAUER SUCT BULB TIP NO VENT (SUCTIONS) ×4 IMPLANT

## 2017-07-03 NOTE — Interval H&P Note (Signed)
History and Physical Interval Note:  07/03/2017 7:28 AM  [name]  has presented today for surgery, with the diagnosis of lumbar spondylolisthesis L4-5, severe lumbar spinal stenosis L4-5, right knee osteoarthritis  The various methods of treatment have been discussed with the patient and family. After consideration of risks, benefits and other options for treatment, the patient has consented to  Procedure(s): Transforaminal lumbar interbody fusion right L4-5 with mPACT pedicle screws and rods, ProTi cage, local bone graft, allograft bone graft, Vivigen Right knee intra-articular injection (N/A) as a surgical intervention .  The patient's history has been reviewed, patient examined, no change in status, stable for surgery.  I have reviewed the patient's chart and labs.  Questions were answered to the patient's satisfaction.     [name]

## 2017-07-03 NOTE — Op Note (Addendum)
07/03/2017  12:08 PM  PATIENT:  [name]  67 y.o. female  MRN: [id number]  OPERATIVE REPORT  PRE-OPERATIVE DIAGNOSIS:  lumbar spondylolisthesis L4-5, severe lumbar spinal stenosis L4-5, right knee osteoarthritis  POST-OPERATIVE DIAGNOSIS:  lumbar spondylolisthesis L4-5, severe lumbar spinal stenosis L4-5, right knee osteoarthritis  PROCEDURE:  Procedure(s): Transforaminal lumbar interbody fusion right Lumbar four-five with mPACT pedicle screws and rods, ProTi cage, local bone graft, allograft bone graft, Vivigen, Bilateral Lumbar five-sacral one hemilaminectomy KNEE INJECTION    SURGEON:  Jessy Oto, MD     ASSISTANT: Benjiman Core, PA-C  (Present throughout the entire procedure and necessary for completion of procedure in a timely manner)     ANESTHESIA:  General, supplemented with local anesthetic marcaine 0.5% 1:1 exparel 1.3% total 30cc, Dr. Roderic Palau.   PREOP DISCUSSION:  I discussed the the findings of this patients preoperative MRI and the finding of lateral recess stenosis at the L5-S1 level. I discussed concern of potential claudication if the Lower level was not adressed and she agreed and the addition of bilateral partial hemilaminectomies at the L5-S1 level to decompress the lateral recess stenosis was added to the consent form and initialled and dated.       COMPLICATIONS:  None.     COMPONENTS:   Implant Name Type Inv. Item Serial No. Manufacturer Lot No. LRB No. Used  BONE VIVIGEN FORMABLE 5.4CC - [id number] Bone Implant BONE VIVIGEN FORMABLE 5.4CC [id number] [employer]   1  BONE CANC CHIPS 20CC - [id number] Bone Implant BONE [facility] CHIPS 20CC [id number] [employer] [place] [employer] [facility]   1  SPACER CONCORDE PRO 5D [id number] - [id number] Spacer SPACER CONCORDE PRO 5D [id number]  [employer] [id number]  1  SCREW VIPER 7X50MM - [id number] Screw SCREW VIPER 7X50MM  [employer]   3  SCREW SET SINGLE INNER -  [id number] Screw SCREW SET SINGLE INNER  [employer]   4  ROD PRE BENT EXPEDIUM 35MM - [id number] Rod ROD PRE BENT EXPEDIUM 35MM  [employer]   2  SCREW VIPER 7X45MM - [id number] Screw SCREW VIPER 7X45MM  [employer]   1    PROCEDURE:The patient was met in the holding area:  I discussed the the findings of this patients preoperative MRI and the finding of lateral recess stenosis at the L5-S1 level. I discussed concern of potential claudication if the Lower level was not adressed and she agreed and the addition of bilateral partial hemilaminectomies at the L5-S1 level to decompress the lateral recess stenosis was added to the consent form and initialled and dated.   The appropriate lumbar level right L4-5 and L5-S1 level identified and marked with an x and my initials and the right knee was marked with an x and my initials.The patient was then transported to OR. The patient was then placed under general anesthesia without difficulty.The patient received appropriate preoperative antibiotic prophylaxis ancef. Nursing staff inserted a Foley catheter under sterile conditions. Prior to turning this patient a time out protocol was carried out and the right knee identified as appropriate knee for intraarticular injection. The right knee prepped with iodofor prep and the left knee injected with 5cc of marcaine 0.5% and 1 cc of depomedrol 40 mg per cc. A bandaid was then applied to the right knee.  She was then turned to a prone position [name] was used for this case. All pressure points were well padded PAS  stocking applied bilateral lower extremity to prevent DVT. Standard prep DuraPrep solution. Draped in the usual manner. Time-out procedure was called and correct .   The incision was at L4-5 and determined using C-arm to mark the inferior L5 pedicles and  an additionally extended up to the L3 spinous process.   Bovie electric cautery was used to control  bleeding and carefully dissection was carried down along the lateral aspects of the spinous process of L3 to L5. Cobb then used to carefully elevate the paralumbar muscle and the incision in the midline was carried to to the level of the base of the residual spinous processes. The posterior exposure area extending from the base of the spinous process of L3 to the superior aspect of L5 was carried expose at its edges debrided the muscle attachments using a Leskell. Time-out procedure was called and correct. Skin in the midline between L2 and L5 was then infiltrated with local anesthesia, marcaine 1/2% 1:1 exparel 1.3% total 20 cc used. Incision was then made  extending from L2-L5  through the skin and subcutaneous layers down to the patient's lumbodorsal fascia and spinous processes. The incision then carried sharply excising the supraspinous ligament and then continuing the lateral aspect of the spinous processes of L3,L4 and L5. Cobb elevator used to carefully elevate the paralumbar muscles off of the posterior elements using electrocautery carefully drilled bleeding and perform dissection of the muscle tissues of the preserving the facet capsule at the L3-4. Continuing the exposure out laterally to expose the lateral margin of the facet joint line at L4-5 and L3-4. Incision was carried in the midline down to the L5 level area bleeders controlled using electrocautery monopolar electrocautery.    C-arm fluoroscopy was then brought into the field and using C-arm fluoroscopy then a hole made into the medial aspect of the pedicle of L4 using a high speed burr observed in the pedicle using C arm at the 5 oclock position on the left L4 pedicle nerve probe initial entry was determined on fluoroscopy to be good position alignment so that a 4.8m tap was passed to 50 mm within the left L4 pedicle to a depth of nearly 50 mm observed on C-arm fluoroscopy to be beyond the midpoint of the lumbar vertebra and then position  alignment within the left L4 pedicle this was then removed and the pedicle channel probed demonstrating patency no sign of rupture the cortex of the pedicle. Tapping with a 4.35 mm screw tap then 5 mm tap then a 6 mm and 7252mtap a 7.0 mm x 50 mm screw was placed on the left side pedicle at the L4 level. C-arm fluoroscopy was then brought into the field and using C-arm fluoroscopy then a hole made into the posterior medial aspect of the pedicle of right L4 observed in the pedicle using ball tipped nerve hook and hockey stick nerve probe initial entry was determined on fluoroscopy to be good position alignment so that 4.52m28map was then used to tap the right L4 pedicle to a depth of nearly 50 mm observed on C-arm fluoroscopy to be beyond the midpoint of the lumbar vertebra and then position alignment within the right L4 pedicle this was then removed and the pedicle channel probed demonstrating patency no sign of rupture the cortex of the pedicle. Tapping with a 5 mm screw tap then tapping with a 6.0 mm and 7.52mm71mp, a 7.0 mm x 50 mm screw was reserved for later placement on the right  side pedicle at the L4 level.C-arm fluoroscopy was then brought into the field and using C-arm fluoroscopy then a hole made into the posterior and medial aspect of the left pedicle of L5 observed in the pedicle using ball tipped nerve hook and hockey stick nerve probe initial entry was determined on fluoroscopy to be good position alignment so that a 4.35 mm tap was then used to tap the left L5 pedicle to a depth of nearly 45 mm observed on C-arm fluoroscopy to be beyond the posterior one third of the lumbar vertebra and good position alignment within the left L5 pedicle this was then removed and the pedicle channel probed demonstrating patency no sign of rupture the cortex of the pedicle. Tapping with a 4.0 mm screw tap then a 5.0 mm tap then tapping with a 6.0 mm and 7.79m tap a 7.068mx 45 mm screw was placed on the left side at the L5  level. The pedicle channel of L5 on the left probed demonstrating patency no sign of rupture the cortex of the pedicle.C-arm fluoroscopy was then brought into the field and using C-arm fluoroscopy then a hole made into the posterior and medial aspect of the right pedicle of L5 observed in the pedicle using ball tipped nerve hook and hockey stick nerve probe initial entry was determined on fluoroscopy to be good position alignment so that a 4.3558map was then used to tap the right L5 pedicle to a depth of nearly 45 mm observed on C-arm fluoroscopy to be beyond the posterior one third of the lumbar vertebra and good position alignment within the right L5 pedicle this was then removed and the pedicle channel probed demonstrating patency no sign of rupture the cortex of the pedicle. Tapping with a 4.48m81mrew tap then up to a 7mm 47m then 7.0mm x55m mm screw was saved for later placement on the right side at the L5 level. The pedicle channel of L5 on the right probed demonstrating patency no sign of rupture the cortex of the pedicle. Viper screw for fixation of this level was measured as 7.0 mm x 50 mm screw was saved for later placement on the right side at the L5 level.  Flow Seal was used for hemostasis of the right L4 and L5 pedicle screw holes.  Soft tissues debrided about the posterior aspect of the L5-S1 interspace. High-speed bur and then used to carefully drill inferior 3 or 4 mm of the left side L5 lamina and on the medial aspect of the left L5 inferior articular process of 3 mm. The superior margin of the S1lamina then carefully debrided with curette and a 2 mm kerrison then used to enter the spinal canal over the superior aspect of the left S1 lamina resecting bone over the superior aspect and freeing up the attachment of ligamentum flavum here. Ligamentum flavum then debrided with the 2 mm and 3 mm Kerrisons we decompressed the left S1 nerve root and the lateral recess left L5-S1 decompressed using 2  and 3 mm Kerrisons sizing hypertrophic reflected ligamentum flavum extending superiorly. From was resected off the ventral aspect of the inferior margin of the L5 lamina. Hockey-stick nerve probe could then be passed out the L5 neuroforamen and the S1 neuroforamen. Venous bleeding encountered. Thrombin-soaked Gelfoam used to control this following this then the sac and the S1 nerve root were mobilized medially and the L5-S1 disc examined and found not to be herniated. Irrigation was carried out down to this bleeding controlled  with Gelfoam.  High-speed bur and then used to carefully drill inferior 3 or 4 mm of the right side L5 lamina and on the medial aspect of the right L5 inferior articular process of 3 mm. The superior margin of the S1 lamina then carefully debrided with curette and a 2 mm kerrison then used to enter the spinal canal over the superior aspect of the right S1 lamina resecting bone over the superior aspect and freeing up the attachment of ligamentum flavum here. Ligamentum flavum then debrided with the 2 mm and 3 mm Kerrisons we decompressed the right S1 nerve root and the lateral recess right L5-S1 decompressed using 2 and 3 mm Kerrisons sizing hypertrophic reflected ligamentum flavum extending superiorly. From was resected off the ventral aspect of the inferior margin of the L5 lamina. Hockey-stick nerve probe could then be passed out the right L5 neuroforamen and the right S1 neuroforamen. Venous bleeding encountered. Thrombin-soaked Gelfoam used to control this following this then the sac and the S1 nerve root were mobilized medially and the right L5-S1 disc examined and found not to be herniated. Irrigation was carried out down to this bleeding controlled with Gelfoam. Gelfoam was then removed. Irrigation carried careful examination demonstrated no active bleeding present.    Spinous processes of  L4 inferior 50%and superior 20% of L5 were then resected down to the base the lamina at each  segment.  Leksell rongeur used to resect inferior aspect of the lamina on the right side at the L4 level and partially on the left side at L4 The left medial 40% of the facets of L4-5 were resected in order to decompress the right and left side of the lumbar thecal sac at L4-5 and decompress the bilateral  L4 and L5 neuroforamen. Osteotomes and 23m and 334mkerrisons were used for this portion of the decompression. Similarly the left side decompression was carried out but  Near complete facetectomy was perform on the right at L4-5 to provide for exposure of the right side L4-5 neuroforamen for ease of placement of TLIF (transforaminal lumbar interbody fusion) at the right L4 level inferior portions of the lamina and pars were also resected first beginning with the Leksell rongeur and osteotomes and then resecting using 2 and 3 mm Kerrison. Continued laminectomy was carried out resecting the central portions of the lamina of L4 and upper L5 performing foraminotomies on the left side at the L4 and L5 levels. The inferior articular process  L4 was resected on the left side.  A large amount of hypertrophic ligmentum flavum was found impressing on the left lateral recesses at L4-5 and narrowing the respective L4 and L5 neuroforamen.  Loupe magnification and headlight were used intially but OR microscope was brought into the field with sterile draping for the decompression at L4-5 and placement of the TLIF cage during this portion procedure. Attention then turned to placement of the transforaminal lumbar interbody fusion cage.  Bleeding controlled using bipolar electrocautery thrombin soaked gel cottonoids. Then turned to the left L4-5 level the exposure the posterior lateral aspect this was carried out using a Penfield 4 bipolar electrocautery to control small bleeders present. Derricho retractor used to retract the thecal sac and L4 nerve root a 15 blade scalpel was used to incise posterior lateral aspect of the left  L4-5 disc the disc space at this level showed a rather severe narrowing posteriorly was more open anteriorly so that an osteotome again was used to resect a small portion the posterior  superior lip of the vertebral body at L5  in order to gain ease of access into the L4-5 disc space. The space was debrided of degenerative disc material using pituitary along root the entire disc space was then debrided of degenerative disc material using pituitary rongeurs curettage down to bleeding bone endplates. 74m and 8 mm shavers were used to debride the disc space and pituitary ronguers used to remove the loosened debris. This space was then carefully assess using spacers  a 10.05mtrial cage provided the best fit, the Depuy concordeLIFT cage 76m176m 69m7m chosen so that the permanent LIFT 9.0 mm cage by 27 mm concorde cage packed with local bone graft and vivigen and cancellous allograft chips were placed into the intervertebral disc space. The posterior intervertebral disc space was then packed with autogenous local bone graft that been harvested from the central laminectomy and vivigen bone graft, allograft. Bleeding controlled using bipolar electrocautery.  Observed on C-arm fluoroscopy to be in good position alignment. The cage at L4-5 was placed anteriorly as best as possible the correct patient's lordosis. The cage was then raised with the inserted screw driver and the LIFT mechanism deployed. The cage was then filled with vivigen bone graft. With this then the transforaminal lumbar interbody fusion portion of the case was completed bleeders were controlled using bipolar electrocautery thrombin-soaked Gelfoam were appropriate.Decortication of the right facet joint carried out at L4-5. These were packed with cancellous local bone graft.  The 2 viper corticofixation screws on the left were each placed and then each fastener carefully aligned  to allow for placement of rods. The left rod was a precontoured 40 mm rod. This  was then placed into the pedicle screws on the left extending from L4-L5 each of the caps were carefully placed loosely tightened.Caps onto the left L4 fastener was tightened to 80 foot lbs. Across the right side a precontoured 35mm876manium rod was placed into the L4 and L5  screw fasteners and the upper cap tightened to 80 foot lbs., compression was obtained on the right side between L5 and L4 compressing between the fasteners and tightening the screw caps 85 pounds.Copious amounts of saline solution this was done throughout the case. Cell Saver was used during the case. No cell saver blood returned to the patient.  Hockey stick neuroprobe was used to probe the neuroforamen bilateral L4 and L5 these were determined to be well decompressed. Permanent C-arm images were obtained in AP and lateral plane and oblique planes. Remaining local bone graft was then applied along both lateral posterior lateral region extending from right L4 to L5 facet bed.Gelfoam was then removed spinal canal. The lumbodorsal musculature carefully exam debrided of any devitalized tissue following removal of self retaining retractors were the bleeders were controlled using electrocautery and the area dorsal lumbar muscle were then approximated in the midline with interrupted #1 Vicryl sutures loose the dorsal fascia was reattached to the spinous process of L3  superiorly and L5  inferiorly this was done with #1 Vicryl sutures. Subcutaneous layers then approximated using interrupted 0 Vicryl sutures and 2-0 Vicryl sutures. Skin was closed with a running subcutaneous stitch of 4-0 Vicryl Dermabond was applied then MedPlex bandage. All instrument and sponge counts were correct. The patient was then returned to a supine position on her bed reactivated extubated and returned to the recovery room in satisfactory condition.    JamesBenjiman Core perform the duties of assistant surgeon during this case. He was present from the  beginning of the case  to the end of the case assisting in transfer the patient from his stretcher to the OR table and back to the stretcher at the end of the case. Assisted in careful retraction and suction of the laminectomy site delicate neural structures operating under the operating room microscope. He performed closure of the incision from the fascia to the skin applying the dressing.      [name] 07/03/2017, 12:08 PM

## 2017-07-03 NOTE — Anesthesia Postprocedure Evaluation (Signed)
Anesthesia Post Note  Patient: [name]  Procedure(s) Performed: Transforaminal lumbar interbody fusion right Lumbar four-five with mPACT pedicle screws and rods, ProTi cage, local bone graft, allograft bone graft, Vivigen, Bilateral Lumbar five-sacral one hemilaminectomy (N/A Back) KNEE INJECTION (Right Knee)     Patient location during evaluation: PACU Anesthesia Type: General Level of consciousness: awake and alert Pain management: pain level controlled Vital Signs Assessment: post-procedure vital signs reviewed and stable Respiratory status: spontaneous breathing, nonlabored ventilation and respiratory function stable Cardiovascular status: blood pressure returned to baseline and stable Postop Assessment: no apparent nausea or vomiting Anesthetic complications: no    Last Vitals:  Vitals:   07/03/17 1330 07/03/17 1338  BP: (!) 86/67 96/65  Pulse: 87 87  Resp: (!) 23 18  Temp:    SpO2: 95% 98%    Last Pain:  Vitals:   07/03/17 1330  TempSrc:   PainSc: Asleep                 Mercer Peifer,W. EDMOND

## 2017-07-03 NOTE — Progress Notes (Signed)
Orthopedic Tech Progress Note Patient Details:  [name] 01/05/1951 [id number] Called [employer] for brace. Patient ID: [name], female   DOB: 1950-07-31, 67 y.o.   MRN: [id number]   Braulio Bosch 07/03/2017, 3:12 PM

## 2017-07-03 NOTE — Brief Op Note (Addendum)
07/03/2017  12:03 PM  PATIENT:  [name]  67 y.o. female  PRE-OPERATIVE DIAGNOSIS:  lumbar spondylolisthesis L4-5, severe lumbar spinal stenosis L4-5, right knee osteoarthritis  POST-OPERATIVE DIAGNOSIS:  lumbar spondylolisthesis L4-5, severe lumbar spinal stenosis L4-5, right knee osteoarthritis  PROCEDURE:  Procedure(s): Transforaminal lumbar interbody fusion right Lumbar four-five with mPACT pedicle screws and rods, ProTi cage, local bone graft, allograft bone graft, Vivigen, Bilateral Lumbar five-sacral one hemilaminectomy (N/A) KNEE INJECTION (Right)  SURGEON:  Surgeon(s) and Role:    * Jessy Oto, MD - Primary  PHYSICIAN ASSISTANT:Jacquan Savas Ricard Dillon, PA-C   ANESTHESIA:   local and general, Dr. Roderic Palau.  EBL:  300 mL   PREOP Discussion:  I discussed the the findings of this patients preoperative MRI and the finding of lateral recess stenosis at the L5-S1 level. I discussed concern of potential claudication if the Lower level was not adressed and she agreed and the addition of bilateral partial hemilaminectomies at the L5-S1 level to decompress the lateral recess stenosis was added to the consent form and initialled and dated.    BLOOD ADMINISTERED:0 CC CELLSAVER  DRAINS: Urinary Catheter (Foley)   LOCAL MEDICATIONS USED:  MARCAINE 0.5% 1:1 EXPAREL 1.3% Amount: 30 ml, Right knee 5 cc of marcaine 0.5% and 1cc of Depomedrol 40mg  per cc.  SPECIMEN:  No Specimen  DISPOSITION OF SPECIMEN:  N/A  COUNTS:  YES  TOURNIQUET:  * No tourniquets in log *  DICTATION: .Dragon Dictation  PLAN OF CARE: Admit to inpatient   PATIENT DISPOSITION:  PACU - hemodynamically stable.   Delay start of Pharmacological VTE agent (>24hrs) due to surgical blood loss or risk of bleeding: yes

## 2017-07-03 NOTE — Progress Notes (Signed)
Orthopedic Tech Progress Note Patient Details:  ASEES MANFREDI [birth year] [id number] Brace completed by [employer]. Patient ID: MAIREN WALLENSTEIN, female   DOB: Dec 09, 1950, 67 y.o.   MRN: [id number]   Braulio Bosch 07/03/2017, 4:53 PM

## 2017-07-03 NOTE — Anesthesia Procedure Notes (Signed)
Procedure Name: Intubation Date/Time: 07/03/2017 7:43 AM Performed by: Renato Shin, CRNA Pre-anesthesia Checklist: Patient identified, Emergency Drugs available, Suction available and Patient being monitored Patient Re-evaluated:Patient Re-evaluated prior to induction Oxygen Delivery Method: Circle system utilized Preoxygenation: Pre-oxygenation with 100% oxygen Induction Type: IV induction Ventilation: Mask ventilation without difficulty Laryngoscope Size: 2 Grade View: Grade II Tube type: Oral Tube size: 7.0 mm Number of attempts: 1 Airway Equipment and Method: Stylet Placement Confirmation: ETT inserted through vocal cords under direct vision,  positive ETCO2,  CO2 detector and breath sounds checked- equal and bilateral Secured at: 21 cm Tube secured with: Tape Dental Injury: Teeth and Oropharynx as per pre-operative assessment

## 2017-07-03 NOTE — Transfer of Care (Signed)
Immediate Anesthesia Transfer of Care Note  Patient: [name]  Procedure(s) Performed: Transforaminal lumbar interbody fusion right Lumbar four-five with mPACT pedicle screws and rods, ProTi cage, local bone graft, allograft bone graft, Vivigen, Bilateral Lumbar five-sacral one hemilaminectomy (N/A Back) KNEE INJECTION (Right Knee)  Patient Location: PACU  Anesthesia Type:General  Level of Consciousness: awake, alert , drowsy and patient cooperative  Airway & Oxygen Therapy: Patient Spontanous Breathing and Patient connected to nasal cannula oxygen  Post-op Assessment: Report given to RN and Post -op Vital signs reviewed and stable  Post vital signs: Reviewed and stable  Last Vitals:  Vitals Value Taken Time  BP 136/97 07/03/2017 12:29 PM  Temp    Pulse 89 07/03/2017 12:30 PM  Resp 18 07/03/2017 12:30 PM  SpO2 96 % 07/03/2017 12:30 PM  Vitals shown include unvalidated device data.  Last Pain:  Vitals:   07/03/17 0644  TempSrc:   PainSc: 2       Patients Stated Pain Goal: 3 (67/[id number]/73 [id number])  Complications: No apparent anesthesia complications

## 2017-07-04 ENCOUNTER — Encounter (HOSPITAL_COMMUNITY): Payer: Self-pay | Admitting: Specialist

## 2017-07-04 LAB — CBC
HEMATOCRIT: 33 % — AB (ref 36.0–46.0)
HEMOGLOBIN: 10.7 g/dL — AB (ref 12.0–15.0)
MCH: 30.1 pg (ref 26.0–34.0)
MCHC: 32.4 g/dL (ref 30.0–36.0)
MCV: 93 fL (ref 78.0–100.0)
Platelets: 161 10*3/uL (ref 150–400)
RBC: 3.55 MIL/uL — ABNORMAL LOW (ref 3.87–5.11)
RDW: 14.2 % (ref 11.5–15.5)
WBC: 7.5 10*3/uL (ref 4.0–10.5)

## 2017-07-04 LAB — BASIC METABOLIC PANEL
Anion gap: 7 (ref 5–15)
BUN: 9 mg/dL (ref 6–20)
CHLORIDE: 107 mmol/L (ref 101–111)
CO2: 25 mmol/L (ref 22–32)
CREATININE: 0.78 mg/dL (ref 0.44–1.00)
Calcium: 8.3 mg/dL — ABNORMAL LOW (ref 8.9–10.3)
GFR calc Af Amer: >60 mL/min (ref >60)
GFR calc non Af Amer: >60 mL/min (ref >60)
GLUCOSE: 134 mg/dL — AB (ref 65–99)
Potassium: 4 mmol/L (ref 3.5–5.1)
Sodium: 139 mmol/L (ref 135–145)

## 2017-07-04 MED FILL — Sodium Chloride IV Soln 0.9%: INTRAVENOUS | Qty: 2000 | Status: CN

## 2017-07-04 MED FILL — Heparin Sodium (Porcine) Inj 1000 Unit/ML: INTRAMUSCULAR | Qty: 30 | Status: AC

## 2017-07-04 MED FILL — Sodium Chloride IV Soln 0.9%: INTRAVENOUS | Qty: 2000 | Status: AC

## 2017-07-04 MED FILL — Heparin Sodium (Porcine) Inj 1000 Unit/ML: INTRAMUSCULAR | Qty: 30 | Status: CN

## 2017-07-04 NOTE — Evaluation (Signed)
Physical Therapy Evaluation Patient Details Name: [name] MRN: [id number] DOB: July 06, 1950 Today's Date: 07/04/2017   History of Present Illness  Patient is a 67 y/o female s/p Transforaminal lumbar interbody fusion right Lumbar four-five, Bilateral Lumbar five-sacral one hemilaminectomy, and R knee injection on 07/03/17. No significant PMH.   Clinical Impression  [name] is a very pleasant 67 y/o female s/p above procedure. Prior to admission patient was independent with all aspects of mobility. Today requiring Min Guard for transfers and mobility for safety as well as verbal cueing for sequencing to maintain back precautions with good carryover. Patient ambulating in hallway with RW and up/down 2 steps with good safety awareness. Pt to continue to follow acutely to maximize safety and mobility prior to d/c.     Follow Up Recommendations No PT follow up    Equipment Recommendations  Rolling walker with 5" wheels    Recommendations for Other Services       Precautions / Restrictions Precautions Precautions: Back Precaution Booklet Issued: Yes (comment) Required Braces or Orthoses: Spinal Brace Spinal Brace: Lumbar corset;Applied in sitting position Restrictions Weight Bearing Restrictions: No      Mobility  Bed Mobility Overal bed mobility: Needs Assistance Bed Mobility: Rolling;Sidelying to Sit Rolling: Min guard Sidelying to sit: Min guard     Sit to sidelying: Min guard General bed mobility comments: verbal cueing for correct log roll technique wiht good carryover  Transfers Overall transfer level: Needs assistance Equipment used: Rolling walker (2 wheeled) Transfers: Sit to/from Stand Sit to Stand: Min guard         General transfer comment: for safety, from various levels and surfaces, no LOB  Ambulation/Gait Ambulation/Gait assistance: Min guard Gait Distance (Feet): 200 Feet Assistive device: Rolling walker (2 wheeled) Gait  Pattern/deviations: Step-through pattern;Decreased stride length Gait velocity: decreased      Stairs Stairs: Yes Stairs assistance: Min guard Stair Management: Two rails;Step to pattern;Forwards Number of Stairs: 2    Wheelchair Mobility    Modified Rankin (Stroke Patients Only)       Balance Overall balance assessment: Needs assistance         Standing balance support: Bilateral upper extremity supported;During functional activity Standing balance-Leahy Scale: Fair Standing balance comment: reliant on RW for dynamic balance                             Pertinent Vitals/Pain Pain Assessment: 0-10 Pain Score: 1  Pain Location: back Pain Descriptors / Indicators: Operative site guarding;Sore Pain Intervention(s): Limited activity within patient's tolerance;Monitored during session;Repositioned    Home Living Family/patient expects to be discharged to:: Private residence Living Arrangements: Spouse/significant other Available Help at Discharge: Available 24 hours/day Type of Home: House Home Access: Stairs to enter Entrance Stairs-Rails: None Entrance Stairs-Number of Steps: 1 Home Layout: Two level;Able to live on main level with bedroom/bathroom Home Equipment: Shower seat - built in;Grab bars - tub/shower;Adaptive equipment      Prior Function Level of Independence: Independent               Hand Dominance        Extremity/Trunk Assessment   Upper Extremity Assessment Upper Extremity Assessment: Overall WFL for tasks assessed    Lower Extremity Assessment Lower Extremity Assessment: Overall WFL for tasks assessed    Cervical / Trunk Assessment Cervical / Trunk Assessment: Normal  Communication   Communication: No difficulties  Cognition Arousal/Alertness: Awake/alert Behavior During Therapy: [facility]  for tasks assessed/performed Overall Cognitive Status: Within Functional Limits for tasks assessed                                         General Comments      Exercises     Assessment/Plan    PT Assessment Patient needs continued PT services  PT Problem List Decreased strength;Decreased activity tolerance;Decreased balance;Decreased mobility;Decreased knowledge of use of DME;Decreased safety awareness       PT Treatment Interventions DME instruction;Gait training;Stair training;Functional mobility training;Therapeutic activities;Therapeutic exercise;Balance training;Neuromuscular re-education;Patient/family education    PT Goals (Current goals can be found in the Care Plan section)  Acute Rehab PT Goals Patient Stated Goal: return home PT Goal Formulation: With patient Time For Goal Achievement: 07/11/17 Potential to Achieve Goals: Good    Frequency Min 5X/week   Barriers to discharge        Co-evaluation               AM-PAC PT "6 Clicks" Daily Activity  Outcome Measure Difficulty turning over in bed (including adjusting bedclothes, sheets and blankets)?: Unable Difficulty moving from lying on back to sitting on the side of the bed? : Unable Difficulty sitting down on and standing up from a chair with arms (e.g., wheelchair, bedside commode, etc,.)?: Unable Help needed moving to and from a bed to chair (including a wheelchair)?: A Little Help needed walking in hospital room?: A Little Help needed climbing 3-5 steps with a railing? : A Little 6 Click Score: 12    End of Session Equipment Utilized During Treatment: Gait belt Activity Tolerance: Patient tolerated treatment well Patient left: in chair;with call bell/phone within reach;with chair alarm set Nurse Communication: Mobility status PT Visit Diagnosis: Unsteadiness on feet (R26.81);Other abnormalities of gait and mobility (R26.89);Muscle weakness (generalized) (M62.81)    Time: 6948-5462 PT Time Calculation (min) (ACUTE ONLY): 33 min   Charges:   PT Evaluation $PT Eval Moderate Complexity: 1 Mod PT  Treatments $Gait Training: 8-22 mins   PT G Codes:        Lanney Gins, PT, DPT 07/04/17 10:46 AM

## 2017-07-04 NOTE — Evaluation (Signed)
Occupational Therapy Evaluation Patient Details Name: [name] MRN: [id number] DOB: Sep 15, 1950 Today's Date: 07/04/2017    History of Present Illness s/p lumbar fusion 1 level and R knee injection.    Clinical Impression   Pt admitted with the above diagnoses and presents with below problem list. Pt will benefit from continued acute OT to address the below listed deficits and maximize independence with basic ADLs prior to d/c home. PTA pt was independent with ADLs. Pt is currently setup to min A with most ADLs, min guard for functional and bed mobility. Educated on techniques and AE for safe ADL completion while maintaining back precautions.       Follow Up Recommendations  No OT follow up;Supervision - Intermittent    Equipment Recommendations  None recommended by OT    Recommendations for Other Services       Precautions / Restrictions Precautions Precautions: Back Precaution Booklet Issued: Yes (comment) Required Braces or Orthoses: Spinal Brace Spinal Brace: Lumbar corset;Applied in sitting position Restrictions Weight Bearing Restrictions: No      Mobility Bed Mobility Overal bed mobility: Needs Assistance Bed Mobility: Rolling;Sit to Sidelying Rolling: Supervision       Sit to sidelying: Min guard General bed mobility comments: min guard for safety  Transfers Overall transfer level: Needs assistance Equipment used: Rolling walker (2 wheeled) Transfers: Sit to/from Stand Sit to Stand: Min guard         General transfer comment: from recliner to EOB. cues for hand placement and technique with rw.    Balance Overall balance assessment: Needs assistance         Standing balance support: Bilateral upper extremity supported Standing balance-Leahy Scale: Fair Standing balance comment: seeks external support for dynamic tasks                           ADL either performed or assessed with clinical judgement   ADL Overall ADL's :  Needs assistance/impaired Eating/Feeding: Set up;Sitting   Grooming: Min guard;Standing   Upper Body Bathing: Sitting;Set up   Lower Body Bathing: Min guard;Minimal assistance;Sit to/from stand   Upper Body Dressing : Set up;Sitting   Lower Body Dressing: Min guard;Minimal assistance;Sit to/from stand   Toilet Transfer: Min guard;Ambulation;RW;Comfort height toilet Toilet Transfer Details (indicate cue type and reason): simulated Toileting- Clothing Manipulation and Hygiene: Minimal assistance;Sit to/from stand Toileting - Clothing Manipulation Details (indicate cue type and reason): discussed AE for pericare to avoid twisting Tub/ Shower Transfer: Walk-in shower;Min guard;Ambulation;Rolling walker   Functional mobility during ADLs: Min guard;Rolling walker General ADL Comments: Pt completed in-room functional mobility and bed mobility. Discussed techniques and AE for ADLs.      Vision         Perception     Praxis      Pertinent Vitals/Pain Pain Assessment: 0-10 Pain Score: 4  Pain Location: back Pain Descriptors / Indicators: Aching;Sore Pain Intervention(s): Monitored during session;Limited activity within patient's tolerance;Repositioned;Patient requesting pain meds-RN notified     Hand Dominance     Extremity/Trunk Assessment Upper Extremity Assessment Upper Extremity Assessment: Overall WFL for tasks assessed   Lower Extremity Assessment Lower Extremity Assessment: Defer to PT evaluation       Communication Communication Communication: No difficulties   Cognition Arousal/Alertness: Awake/alert Behavior During Therapy: WFL for tasks assessed/performed Overall Cognitive Status: Within Functional Limits for tasks assessed  General Comments       Exercises     Shoulder Instructions      Home Living Family/patient expects to be discharged to:: Private residence Living Arrangements:  Spouse/significant other Available Help at Discharge: Available 24 hours/day Type of Home: House Home Access: Stairs to enter [employer] of Steps: 1   Home Layout: Two level;Able to live on main level with bedroom/bathroom     Bathroom Shower/Tub: Occupational psychologist: Handicapped height     Home Equipment: [place] - built in;Grab bars - tub/shower;Adaptive equipment Adaptive Equipment: [place]        Prior Functioning/Environment Level of Independence: Independent                 OT Problem List: Impaired balance (sitting and/or standing);Decreased knowledge of use of DME or AE;Decreased knowledge of precautions;Pain      OT Treatment/Interventions: Self-care/ADL training;DME and/or AE instruction;Therapeutic activities;Patient/family education;Balance training    OT Goals(Current goals can be found in the care plan section) Acute Rehab OT Goals Patient Stated Goal: not stated OT Goal Formulation: With patient Time For Goal Achievement: 07/11/17 Potential to Achieve Goals: Good ADL Goals Pt Will Perform Lower Body Bathing: with modified independence;sit to/from stand Pt Will Perform Upper Body Dressing: sitting;with modified independence Pt Will Perform Lower Body Dressing: with modified independence;sit to/from stand Pt Will Transfer to Toilet: with modified independence;ambulating Pt Will Perform Toileting - Clothing Manipulation and hygiene: with modified independence;sit to/from stand;with adaptive equipment Pt Will Perform Tub/Shower Transfer: Shower transfer;with supervision;ambulating;shower seat;rolling walker  OT Frequency: Min 2X/week   Barriers to D/C:            Co-evaluation              AM-PAC PT "6 Clicks" Daily Activity     Outcome Measure Help from another person eating meals?: None Help from another person taking care of personal grooming?: None Help from another person toileting, which includes using  toliet, bedpan, or urinal?: A Little Help from another person bathing (including washing, rinsing, drying)?: A Little Help from another person to put on and taking off regular upper body clothing?: A Little Help from another person to put on and taking off regular lower body clothing?: A Little 6 Click Score: 20   End of Session Equipment Utilized During Treatment: Rolling walker;Back brace Nurse Communication: Patient requests pain meds  Activity Tolerance: Patient tolerated treatment well;Patient limited by pain Patient left: in bed;with call bell/phone within reach  OT Visit Diagnosis: Unsteadiness on feet (R26.81);Pain                Time: 4982-6415 OT Time Calculation (min): 22 min Charges:  OT General Charges $OT Visit: 1 Visit OT Evaluation $OT Eval Low Complexity: 1 Low G-Codes:     {e  Hortencia Pilar 07/04/2017, 10:07 AM

## 2017-07-04 NOTE — Progress Notes (Signed)
     Subjective: 1 Day Post-Op Procedure(s) (LRB): Transforaminal lumbar interbody fusion right Lumbar four-five with mPACT pedicle screws and rods, ProTi cage, local bone graft, allograft bone graft, Vivigen, Bilateral Lumbar five-sacral one hemilaminectomy (N/A) KNEE INJECTION (Right)Awake,alert  And oriented x 4. Foley recently removed, not voided yet. Has been able to walk in the hall this AM.   Patient reports pain as moderate.    Objective:   VITALS:  Temp:  [97.7 F (36.5 C)-98.8 F (37.1 C)] 97.9 F (36.6 C) (06/18 0342) Pulse Rate:  [81-103] 85 (06/18 0342) Resp:  [7-23] 16 (06/18 0342) BP: (86-136)/(58-97) 115/75 (06/18 0342) SpO2:  [91 %-100 %] 98 % (06/18 0342)  Neurologically intact ABD soft Neurovascular intact Sensation intact distally Intact pulses distally Dorsiflexion/Plantar flexion intact Incision: dressing C/D/I   LABS Recent Labs    07/04/17 0538  HGB 10.7*  WBC 7.5  PLT 161   Recent Labs    07/04/17 0538  NA 139  K 4.0  CL 107  CO2 25  BUN 9  CREATININE 0.78  GLUCOSE 134*   No results for input(s): LABPT, INR in the last 72 hours.   Assessment/Plan: 1 Day Post-Op Procedure(s) (LRB): Transforaminal lumbar interbody fusion right Lumbar four-five with mPACT pedicle screws and rods, ProTi cage, local bone graft, allograft bone graft, Vivigen, Bilateral Lumbar five-sacral one hemilaminectomy (N/A) KNEE INJECTION (Right)  Advance diet Up with therapy D/C IV fluids  [name] 07/04/2017, 7:05 AMPatient ID: [name], female   DOB: 02/21/1950, 67 y.o.   MRN: [id number]

## 2017-07-04 NOTE — Care Management Note (Signed)
Case Management Note  Patient Details  Name: [name] MRN: [id number] Date of Birth: 1950-04-14  Subjective/Objective:      Pt s/p lumbar fusion. She is from home with spouse.               Action/Plan: MD placed orders for [facility] services. CM provided choice and she selected [place]. Jermaine with [facility] notified and accepted the referral.  Pt with orders for walker and 3 in 1. Pt refused 3 in 1 stating she has what she needs at home. Jermaine with [facility] made aware of walker and it will be delivered to the room. Pt has hospital f/u and transportation home.   Expected Discharge Date:                  Expected Discharge Plan:  [place]  In-House Referral:     Discharge planning Services  CM Consult  Post Acute Care Choice:  Durable Medical Equipment, Home Health Choice offered to:  Patient  DME Arranged:    DME Agency:     HH Arranged:  PT HH Agency:     Status of Service:  In process, will continue to follow  If discussed at Long Length of Stay Meetings, dates discussed:    Additional Comments:  Pollie Friar, RN 07/04/2017, 1:54 PM

## 2017-07-05 ENCOUNTER — Inpatient Hospital Stay (INDEPENDENT_AMBULATORY_CARE_PROVIDER_SITE_OTHER): Payer: Medicare Other | Admitting: Orthopedic Surgery

## 2017-07-05 DIAGNOSIS — M4316 Spondylolisthesis, lumbar region: Secondary | ICD-10-CM

## 2017-07-05 DIAGNOSIS — D500 Iron deficiency anemia secondary to blood loss (chronic): Secondary | ICD-10-CM | POA: Diagnosis not present

## 2017-07-05 DIAGNOSIS — M1711 Unilateral primary osteoarthritis, right knee: Secondary | ICD-10-CM | POA: Diagnosis present

## 2017-07-05 DIAGNOSIS — M48062 Spinal stenosis, lumbar region with neurogenic claudication: Secondary | ICD-10-CM

## 2017-07-05 LAB — CBC
HCT: 33.7 % — ABNORMAL LOW (ref 36.0–46.0)
Hemoglobin: 10.6 g/dL — ABNORMAL LOW (ref 12.0–15.0)
MCH: 30.1 pg (ref 26.0–34.0)
MCHC: 31.5 g/dL (ref 30.0–36.0)
MCV: 95.7 fL (ref 78.0–100.0)
Platelets: 140 10*3/uL — ABNORMAL LOW (ref 150–400)
RBC: 3.52 MIL/uL — ABNORMAL LOW (ref 3.87–5.11)
RDW: 14.2 % (ref 11.5–15.5)
WBC: 6.5 10*3/uL (ref 4.0–10.5)

## 2017-07-05 MED ORDER — ACETAMINOPHEN 325 MG PO TABS: 650.0000 mg | ORAL_TABLET | ORAL | 1 refills | Status: DC | PRN

## 2017-07-05 MED ORDER — FERROUS GLUCONATE 324 (38 FE) MG PO TABS
324.0000 mg | ORAL_TABLET | Freq: Two times a day (BID) | ORAL | Status: DC
  Administered 2017-07-05 – 2017-07-06 (×2): 324 mg via ORAL
  Filled 2017-07-05 (×3): qty 1

## 2017-07-05 MED ORDER — DOCUSATE SODIUM 100 MG PO CAPS: 100.0000 mg | ORAL_CAPSULE | Freq: Two times a day (BID) | ORAL | 0 refills | Status: DC

## 2017-07-05 MED ORDER — OXYCODONE HCL 5 MG PO TABS: 5.0000 mg | ORAL_TABLET | ORAL | 0 refills | Status: DC | PRN

## 2017-07-05 MED ORDER — FERROUS GLUCONATE 324 (38 FE) MG PO TABS: 324.0000 mg | ORAL_TABLET | Freq: Two times a day (BID) | ORAL | 1 refills | Status: DC

## 2017-07-05 MED ORDER — METHOCARBAMOL 500 MG PO TABS: 500.0000 mg | ORAL_TABLET | Freq: Four times a day (QID) | ORAL | 1 refills | Status: DC | PRN

## 2017-07-05 NOTE — Consult Note (Signed)
[facility] CM Primary Care Navigator  07/05/2017  [name] 12-Apr-1950 [id number]   Met withpatientand husband [name]) at the bedside toidentify possible discharge needs. Patientreportsthat shehad"increased, burning lower back and buttocks pain" thathad ledto thisadmission/ surgery. (L4- L5 stenosis low back pain and lower extremity radiculopathy status post transforaminal lumbar fusion; right knee arthritis)  PatientendorsesDr.Rupashree Barnes with  [facility] care provider.   [facility] [employer] [facility] on [employer] to obtainmedications without difficulty.  Patientreports that shehas beenmanagingher ownmedications at home, straight out of the containers andwith use of "pill box" when travelling.  Patient verbalizedthat she was driving prior to admission/ surgery but husband willbe providing transportationto her doctors' appointments after discharge.  Patient lives with husband at home who will serve as the primary caregiver upon discharge.  Anticipated plan for dischargeis home with home health services per patient.  Patientand husband voiced understandingto callprimarycare provider'soffice whenshereturnshome,for a post discharge follow-upvisit within1- 2 weeksor sooner if needs arise.Patient letter (with PCP's contact number) was provided asareminder.   Discussed with patient and husbandregarding [facility] CM services available for health management/ resourcesat homebutshedenied any needs or concerns at this time.  Patientverbalizedunderstandingto seekreferral from primary care provider to [facility] care management ifdeemed necessary and appropriatefor anyservicesin thefuture.  [facility] care management information was provided for any futureneeds thatshe may have.  Patient however, verbally agreedand  optedforEMMIcalls tofollow-up with recoveryat home.   Referral made for [facility] General calls after discharge.   For additional questions please contact:  [name], BSN, RN-BC [facility] PRIMARY CARE Navigator Cell: [phone]

## 2017-07-05 NOTE — Progress Notes (Signed)
     Subjective: 2 Days Post-Op Procedure(s) (LRB): Transforaminal lumbar interbody fusion right Lumbar four-five with mPACT pedicle screws and rods, ProTi cage, local bone graft, allograft bone graft, Vivigen, Bilateral Lumbar five-sacral one hemilaminectomy (N/A) KNEE INJECTION (Right) Awake, alert and oriented x 4. Dressing is dry, participating with PT.   Patient reports pain as moderate.    Objective:   VITALS:  Temp:  [98 F (36.7 C)-99.2 F (37.3 C)] 98.5 F (36.9 C) (06/19 1553) Pulse Rate:  [85-100] 85 (06/19 1553) Resp:  [15-20] 20 (06/19 1553) BP: (94-120)/(52-74) 106/52 (06/19 1553) SpO2:  [96 %-100 %] 99 % (06/19 1553)  Neurologically intact ABD soft Neurovascular intact Sensation intact distally Intact pulses distally Dorsiflexion/Plantar flexion intact Incision: no drainage No cellulitis present   LABS Recent Labs    07/04/17 0538  HGB 10.7*  WBC 7.5  PLT 161   Recent Labs    07/04/17 0538  NA 139  K 4.0  CL 107  CO2 25  BUN 9  CREATININE 0.78  GLUCOSE 134*   No results for input(s): LABPT, INR in the last 72 hours.   Assessment/Plan: 2 Days Post-Op Procedure(s) (LRB): Transforaminal lumbar interbody fusion right Lumbar four-five with mPACT pedicle screws and rods, ProTi cage, local bone graft, allograft bone graft, Vivigen, Bilateral Lumbar five-sacral one hemilaminectomy (N/A) KNEE INJECTION (Right)Osteoarthritis right knee.  Anemia of acute blood loss.   Advance diet Up with therapy Plan for discharge tomorrow Discharge home with home health  Start ferrous gluconate. Check H/H this afternoon as she had decreased hgb yesterday to ensure it stablizes.  [name] 07/05/2017, 4:17 PMPatient ID: [name], female   DOB: June 25, 1950, 67 y.o.   MRN: [id number]

## 2017-07-05 NOTE — Progress Notes (Signed)
     Subjective: 2 Days Post-Op Procedure(s) (LRB): Transforaminal lumbar interbody fusion right Lumbar four-five with mPACT pedicle screws and rods, ProTi cage, local bone graft, allograft bone graft, Vivigen, Bilateral Lumbar five-sacral one hemilaminectomy (N/A) KNEE INJECTION (Right) Awake, alert and oriented x 4. Sitting up for breakfast, tolerating pos regular food. No stomach bloating. Pain level are better  Patient reports pain as moderate.    Objective:   VITALS:  Temp:  [98.7 F (37.1 C)-99.2 F (37.3 C)] 98.7 F (37.1 C) (06/19 0509) Pulse Rate:  [87-92] 87 (06/19 0509) Resp:  [15-20] 20 (06/19 0509) BP: (100-120)/(52-74) 100/52 (06/19 0509) SpO2:  [96 %-100 %] 96 % (06/19 0509)  Neurologically intact ABD soft Neurovascular intact Sensation intact distally Intact pulses distally Dorsiflexion/Plantar flexion intact Incision: dressing C/D/I and no drainage No cellulitis present   LABS Recent Labs    07/04/17 0538  HGB 10.7*  WBC 7.5  PLT 161   Recent Labs    07/04/17 0538  NA 139  K 4.0  CL 107  CO2 25  BUN 9  CREATININE 0.78  GLUCOSE 134*   No results for input(s): LABPT, INR in the last 72 hours.   Assessment/Plan: 2 Days Post-Op Procedure(s) (LRB): Transforaminal lumbar interbody fusion right Lumbar four-five with mPACT pedicle screws and rods, ProTi cage, local bone graft, allograft bone graft, Vivigen, Bilateral Lumbar five-sacral one hemilaminectomy (N/A) KNEE INJECTION (Right)  Advance diet Up with therapy D/C IV fluids Plan for discharge tomorrow Discharge home with home health  [name] 07/05/2017, 8:53 AMPatient ID: [name], female   DOB: Oct 14, 1950, 67 y.o.   MRN: [id number]

## 2017-07-05 NOTE — Progress Notes (Signed)
Physical Therapy Treatment Patient Details Name: [name] MRN: [id number] DOB: 1950/12/19 Today's Date: 07/05/2017    History of Present Illness Patient is a 67 y/o female s/p Transforaminal lumbar interbody fusion right Lumbar four-five, Bilateral Lumbar five-sacral one hemilaminectomy, and R knee injection on 07/03/17. No significant PMH.     PT Comments    Patient doing well this AM. Reports ability to walk with husband yesterday evening without issue. Continued education on log rolling technique as patient is fearful of breaking back precautions. Transfers and gait/stair navigation at [employer] level for safety, with no LOB noted. Questions answered regarding brace usage at home as well as appropriate height of AD.  PT to continue to follow acutely.   Follow Up Recommendations  Home health PT     Equipment Recommendations  Rolling walker with 5" wheels    Recommendations for Other Services       Precautions / Restrictions Precautions Precautions: Back Required Braces or Orthoses: Spinal Brace Spinal Brace: Lumbar corset;Applied in sitting position Restrictions Weight Bearing Restrictions: No    Mobility  Bed Mobility Overal bed mobility: Needs Assistance Bed Mobility: Rolling;Sidelying to Sit;Sit to Sidelying Rolling: Min guard;Supervision Sidelying to sit: Min guard;Supervision     Sit to sidelying: Min guard;Supervision General bed mobility comments: continued VC for log rolling - reassurance that pateint is performing correctly as she is fearful that she is going to break precautions  Transfers Overall transfer level: Needs assistance Equipment used: Rolling walker (2 wheeled) Transfers: Sit to/from Stand Sit to Stand: Min guard         General transfer comment: for safety, from bed and recliner, with increased time required from EOB as she does not have handrails to push up from  Ambulation/Gait Ambulation/Gait assistance: Min guard Gait  Distance (Feet): 250 Feet Assistive device: Rolling walker (2 wheeled) Gait Pattern/deviations: Step-through pattern;Decreased stride length;Narrow base of support Gait velocity: decreased   General Gait Details: Min guard for safety, no LOB   Stairs Stairs: Yes Stairs assistance: Min guard;Supervision Stair Management: Two rails;Step to pattern;Forwards Number of Stairs: 2 General stair comments: 4 reps   Wheelchair Mobility    Modified Rankin (Stroke Patients Only)       Balance Overall balance assessment: Needs assistance Sitting-balance support: No upper extremity supported;Feet supported Sitting balance-Leahy Scale: Good     Standing balance support: Bilateral upper extremity supported;During functional activity Standing balance-Leahy Scale: Fair                              Cognition Arousal/Alertness: Awake/alert Behavior During Therapy: WFL for tasks assessed/performed Overall Cognitive Status: Within Functional Limits for tasks assessed                                        Exercises      General Comments        Pertinent Vitals/Pain Pain Assessment: 0-10 Pain Score: 2  Pain Location: back Pain Descriptors / Indicators: Aching;Operative site guarding;Sore Pain Intervention(s): Limited activity within patient's tolerance;Monitored during session;Repositioned    Home Living                      Prior Function            PT Goals (current goals can now be found in the care plan section) Acute  Rehab PT Goals Patient Stated Goal: return home PT Goal Formulation: With patient Time For Goal Achievement: 07/11/17 Potential to Achieve Goals: Good Progress towards PT goals: Progressing toward goals    Frequency    Min 5X/week      PT Plan Current plan remains appropriate;Discharge plan needs to be updated    Co-evaluation              AM-PAC PT "6 Clicks" Daily Activity  Outcome Measure   Difficulty turning over in bed (including adjusting bedclothes, sheets and blankets)?: A Little Difficulty moving from lying on back to sitting on the side of the bed? : Unable Difficulty sitting down on and standing up from a chair with arms (e.g., wheelchair, bedside commode, etc,.)?: Unable Help needed moving to and from a bed to chair (including a wheelchair)?: A Little Help needed walking in hospital room?: A Little Help needed climbing 3-5 steps with a railing? : A Little 6 Click Score: 14    End of Session Equipment Utilized During Treatment: Gait belt Activity Tolerance: Patient tolerated treatment well Patient left: in bed;with call bell/phone within reach Nurse Communication: Mobility status PT Visit Diagnosis: Unsteadiness on feet (R26.81);Other abnormalities of gait and mobility (R26.89);Muscle weakness (generalized) (M62.81)     Time: 1660-6301 PT Time Calculation (min) (ACUTE ONLY): 26 min  Charges:  $Gait Training: 8-22 mins $Therapeutic Activity: 8-22 mins                    G Codes:       Lanney Gins, PT, DPT 07/05/17 9:43 AM

## 2017-07-06 ENCOUNTER — Telehealth (INDEPENDENT_AMBULATORY_CARE_PROVIDER_SITE_OTHER): Payer: Self-pay | Admitting: Specialist

## 2017-07-06 NOTE — Telephone Encounter (Signed)
Patient had surgery on 6/17 and was told to follow up in 2 weeks, is it ok for her to follow up with Jeneen Rinks on 7/3 since we are closed on Thursday for him to see Dr. Arvil Persons patients?

## 2017-07-06 NOTE — Progress Notes (Signed)
     Subjective: 3 Days Post-Op Procedure(s) (LRB): Transforaminal lumbar interbody fusion right Lumbar four-five with mPACT pedicle screws and rods, ProTi cage, local bone graft, allograft bone graft, Vivigen, Bilateral Lumbar five-sacral one hemilaminectomy (N/A) KNEE INJECTION (Right)  I' m ready to go. Incisin is dry, no erythrema or drainage.  Patient reports pain as mild.    Objective:   VITALS:  Temp:  [98 F (36.7 C)-99.3 F (37.4 C)] 98.9 F (37.2 C) (06/20 0345) Pulse Rate:  [77-92] 77 (06/20 0345) Resp:  [14-20] 18 (06/20 0345) BP: (93-110)/(50-62) 93/50 (06/20 0345) SpO2:  [96 %-100 %] 96 % (06/20 0345)  Neurologically intact ABD soft Neurovascular intact Sensation intact distally Intact pulses distally Dorsiflexion/Plantar flexion intact Incision: dressing C/D/I and no drainage No cellulitis present   LABS Recent Labs    07/04/17 0538 07/05/17 1604  HGB 10.7* 10.6*  WBC 7.5 6.5  PLT 161 140*   Recent Labs    07/04/17 0538  NA 139  K 4.0  CL 107  CO2 25  BUN 9  CREATININE 0.78  GLUCOSE 134*   No results for input(s): LABPT, INR in the last 72 hours.   Assessment/Plan: 3 Days Post-Op Procedure(s) (LRB): Transforaminal lumbar interbody fusion right Lumbar four-five with mPACT pedicle screws and rods, ProTi cage, local bone graft, allograft bone graft, Vivigen, Bilateral Lumbar five-sacral one hemilaminectomy (N/A) KNEE INJECTION (Right)  Advance diet Discharge home with home health  [name] 07/06/2017, 9:39 AMPatient ID: [name], female   DOB: 06-Aug-1950, 67 y.o.   MRN: [id number]

## 2017-07-06 NOTE — Progress Notes (Signed)
Occupational Therapy Treatment Patient Details Name: [name] MRN: [id number] DOB: August 24, 1950 Today's Date: 07/06/2017    History of present illness Patient is a 67 y/o female s/p Transforaminal lumbar interbody fusion right Lumbar four-five, Bilateral Lumbar five-sacral one hemilaminectomy, and R knee injection on 07/03/17. No significant PMH.    OT comments  Pt progressing towards OT goals, presents sitting up in recliner, pleasant and agreeable to tx session. Reviewed and further educated on AE for completing ADLs this session with pt verbalizing and return demonstrating understanding including AE for LB and toileting ADLs. Pt completing room level functional mobility using RW and standing grooming ADLs with overall supervision, min verbal cues for maintaining back precautions during ADL completion. Feel POC remains appropriate. Pt anticipating d/c home later today.   Follow Up Recommendations  No OT follow up;Supervision - Intermittent    Equipment Recommendations  None recommended by OT          Precautions / Restrictions Precautions Precautions: Back Precaution Comments: verbally reviewed back precautions with pt  Required Braces or Orthoses: Spinal Brace Spinal Brace: Lumbar corset;Applied in sitting position Restrictions Weight Bearing Restrictions: No       Mobility Bed Mobility Overal bed mobility: Needs Assistance Bed Mobility: Rolling;Sidelying to Sit Rolling: Supervision Sidelying to sit: Supervision       General bed mobility comments: OOB in recliner   Transfers Overall transfer level: Needs assistance Equipment used: Rolling walker (2 wheeled) Transfers: Sit to/from Stand Sit to Stand: Supervision         General transfer comment: for safety     Balance Overall balance assessment: Needs assistance Sitting-balance support: No upper extremity supported;Feet supported Sitting balance-Leahy Scale: Good     Standing balance support:  Bilateral upper extremity supported;During functional activity Standing balance-Leahy Scale: Good                             ADL either performed or assessed with clinical judgement   ADL Overall ADL's : Needs assistance/impaired     Grooming: Supervision/safety;Standing;Oral care;Wash/dry face Grooming Details (indicate cue type and reason): min cues for maintaining back precautions in standing; pt utilizing compensatory strategies for oral care              Lower Body Dressing: Min guard;Sit to/from stand;With adaptive equipment Lower Body Dressing Details (indicate cue type and reason): educated and reviewed use of AE for completing LB dressing with pt return demonstrating use of sock aide and reacher        Toileting - Clothing Manipulation Details (indicate cue type and reason): reviewed and educated pt on toilet aide for completing peri-care      Functional mobility during ADLs: Min guard;Supervision/safety;Rolling walker General ADL Comments: further reviewed brace management, AE, safety and compensatory strategies for completing ADLs while adhering to precautions      Cognition Arousal/Alertness: Awake/alert Behavior During Therapy: WFL for tasks assessed/performed Overall Cognitive Status: Within Functional Limits for tasks assessed                                                      General Comments      Pertinent Vitals/ Pain       Pain Assessment: No/denies pain Pain Score: 1  Pain Location: back Pain Descriptors / Indicators:  Sore Pain Intervention(s): Monitored during session  Home Living                                          Prior Functioning/Environment              Frequency  Min 2X/week        Progress Toward Goals  OT Goals(current goals can now be found in the care plan section)  Progress towards OT goals: Progressing toward goals  Acute Rehab OT Goals Patient Stated Goal:  return home OT Goal Formulation: With patient Time For Goal Achievement: 07/11/17 Potential to Achieve Goals: Good  Plan Discharge plan remains appropriate    Co-evaluation                 AM-PAC PT "6 Clicks" Daily Activity     Outcome Measure   Help from another person eating meals?: None Help from another person taking care of personal grooming?: None Help from another person toileting, which includes using toliet, bedpan, or urinal?: A Little Help from another person bathing (including washing, rinsing, drying)?: A Little Help from another person to put on and taking off regular upper body clothing?: A Little Help from another person to put on and taking off regular lower body clothing?: A Little 6 Click Score: 20    End of Session Equipment Utilized During Treatment: Rolling walker;Back brace  OT Visit Diagnosis: Unsteadiness on feet (R26.81)   Activity Tolerance Patient tolerated treatment well   Patient Left in chair;with call bell/phone within reach;with family/visitor present   Nurse Communication Mobility status        Time: 0626-9485 OT Time Calculation (min): 26 min  Charges: OT General Charges $OT Visit: 1 Visit OT Treatments $Self Care/Home Management : 23-37 mins  Lou Cal, OT Pager [phone] 07/06/2017   [name] 07/06/2017, 10:25 AM

## 2017-07-06 NOTE — Plan of Care (Signed)
[name] ambulated with standby assist in the hall this morning, using a walker.  New walker placed in her room to take home.  She is pain controlled and voiding appropriately, but notes that she would like to have a BM today.  I gave a PRN dose of Miralax along with her morning Colace to encourage bowel motility.  The patient has discharge orders and shows no signs or symptoms of distress at this time. Anticipate discharge before noon today.

## 2017-07-06 NOTE — Progress Notes (Signed)
Physical Therapy Treatment Patient Details Name: [name] MRN: [id number] DOB: Dec 07, 1950 Today's Date: 07/06/2017    History of Present Illness Patient is a 67 y/o female s/p Transforaminal lumbar interbody fusion right Lumbar four-five, Bilateral Lumbar five-sacral one hemilaminectomy, and R knee injection on 07/03/17. No significant PMH.     PT Comments    Pateitn doing really well. Reports minimal discomfort at low back and excited to return home. Does have questions that are more appropriate for nursing/MD to answer - discussed to bring these questions to them with good understanding. Ambulating for prolonged distances today with RW with no LOB or instability noted. Reviewed back precautions with good understanding/carryover.     Follow Up Recommendations  Home health PT     Equipment Recommendations  Rolling walker with 5" wheels    Recommendations for Other Services       Precautions / Restrictions Precautions Precautions: Back Required Braces or Orthoses: Spinal Brace Spinal Brace: Lumbar corset;Applied in sitting position Restrictions Weight Bearing Restrictions: No    Mobility  Bed Mobility Overal bed mobility: Needs Assistance Bed Mobility: Rolling;Sidelying to Sit Rolling: Supervision Sidelying to sit: Supervision       General bed mobility comments: good sequencing and technique - aware of precautions  Transfers Overall transfer level: Needs assistance Equipment used: Rolling walker (2 wheeled) Transfers: Sit to/from Stand Sit to Stand: Supervision         General transfer comment: improved power up with no instability noted  Ambulation/Gait Ambulation/Gait assistance: Supervision Gait Distance (Feet): 400 Feet Assistive device: Rolling walker (2 wheeled) Gait Pattern/deviations: Step-through pattern;Decreased stride length Gait velocity: decreased       Stairs             Wheelchair Mobility    Modified Rankin (Stroke  Patients Only)       Balance Overall balance assessment: Needs assistance Sitting-balance support: No upper extremity supported;Feet supported Sitting balance-Leahy Scale: Good     Standing balance support: Bilateral upper extremity supported;During functional activity Standing balance-Leahy Scale: Good                              Cognition Arousal/Alertness: Awake/alert Behavior During Therapy: WFL for tasks assessed/performed Overall Cognitive Status: Within Functional Limits for tasks assessed                                        Exercises      General Comments        Pertinent Vitals/Pain Pain Assessment: 0-10 Pain Score: 1  Pain Location: back Pain Descriptors / Indicators: Sore Pain Intervention(s): Limited activity within patient's tolerance;Monitored during session;Repositioned    Home Living                      Prior Function            PT Goals (current goals can now be found in the care plan section) Acute Rehab PT Goals Patient Stated Goal: return home PT Goal Formulation: With patient Time For Goal Achievement: 07/11/17 Potential to Achieve Goals: Good Progress towards PT goals: Progressing toward goals    Frequency    Min 5X/week      PT Plan Current plan remains appropriate    Co-evaluation              AM-PAC  PT "6 Clicks" Daily Activity  Outcome Measure  Difficulty turning over in bed (including adjusting bedclothes, sheets and blankets)?: A Little Difficulty moving from lying on back to sitting on the side of the bed? : A Little Difficulty sitting down on and standing up from a chair with arms (e.g., wheelchair, bedside commode, etc,.)?: A Little Help needed moving to and from a bed to chair (including a wheelchair)?: A Little Help needed walking in hospital room?: A Little Help needed climbing 3-5 steps with a railing? : A Little 6 Click Score: 18    End of Session Equipment  Utilized During Treatment: Gait belt Activity Tolerance: Patient tolerated treatment well Patient left: in chair;with call bell/phone within reach Nurse Communication: Mobility status PT Visit Diagnosis: Unsteadiness on feet (R26.81);Other abnormalities of gait and mobility (R26.89);Muscle weakness (generalized) (M62.81)     Time: 3716-9678 PT Time Calculation (min) (ACUTE ONLY): 17 min  Charges:  $Gait Training: 8-22 mins                    G Codes:       Lanney Gins, PT, DPT 07/06/17 9:52 AM

## 2017-07-07 ENCOUNTER — Telehealth (INDEPENDENT_AMBULATORY_CARE_PROVIDER_SITE_OTHER): Payer: Self-pay | Admitting: Specialist

## 2017-07-07 DIAGNOSIS — M48061 Spinal stenosis, lumbar region without neurogenic claudication: Secondary | ICD-10-CM | POA: Diagnosis not present

## 2017-07-07 DIAGNOSIS — Z7982 Long term (current) use of aspirin: Secondary | ICD-10-CM | POA: Diagnosis not present

## 2017-07-07 DIAGNOSIS — Z4789 Encounter for other orthopedic aftercare: Secondary | ICD-10-CM | POA: Diagnosis not present

## 2017-07-07 DIAGNOSIS — E039 Hypothyroidism, unspecified: Secondary | ICD-10-CM | POA: Diagnosis not present

## 2017-07-07 DIAGNOSIS — M1711 Unilateral primary osteoarthritis, right knee: Secondary | ICD-10-CM | POA: Diagnosis not present

## 2017-07-07 DIAGNOSIS — K219 Gastro-esophageal reflux disease without esophagitis: Secondary | ICD-10-CM | POA: Diagnosis not present

## 2017-07-07 NOTE — Telephone Encounter (Signed)
Holding for JN 

## 2017-07-07 NOTE — Telephone Encounter (Signed)
Debbie Dabb-PT called needing verbal orders for HHPT 1 wk 1 and 2 wk 3   The number to contact Jackelyn Poling is [phone]

## 2017-07-07 NOTE — Telephone Encounter (Signed)
Yes ma'am that is perfectly fine

## 2017-07-10 DIAGNOSIS — Z4789 Encounter for other orthopedic aftercare: Secondary | ICD-10-CM | POA: Diagnosis not present

## 2017-07-10 DIAGNOSIS — K219 Gastro-esophageal reflux disease without esophagitis: Secondary | ICD-10-CM | POA: Diagnosis not present

## 2017-07-10 DIAGNOSIS — M48061 Spinal stenosis, lumbar region without neurogenic claudication: Secondary | ICD-10-CM | POA: Diagnosis not present

## 2017-07-10 DIAGNOSIS — E039 Hypothyroidism, unspecified: Secondary | ICD-10-CM | POA: Diagnosis not present

## 2017-07-10 DIAGNOSIS — Z7982 Long term (current) use of aspirin: Secondary | ICD-10-CM | POA: Diagnosis not present

## 2017-07-10 DIAGNOSIS — M1711 Unilateral primary osteoarthritis, right knee: Secondary | ICD-10-CM | POA: Diagnosis not present

## 2017-07-11 NOTE — Telephone Encounter (Signed)
I called and gave verbal auth ?

## 2017-07-13 DIAGNOSIS — E039 Hypothyroidism, unspecified: Secondary | ICD-10-CM | POA: Diagnosis not present

## 2017-07-13 DIAGNOSIS — M1711 Unilateral primary osteoarthritis, right knee: Secondary | ICD-10-CM | POA: Diagnosis not present

## 2017-07-13 DIAGNOSIS — Z4789 Encounter for other orthopedic aftercare: Secondary | ICD-10-CM | POA: Diagnosis not present

## 2017-07-13 DIAGNOSIS — Z7982 Long term (current) use of aspirin: Secondary | ICD-10-CM | POA: Diagnosis not present

## 2017-07-13 DIAGNOSIS — M48061 Spinal stenosis, lumbar region without neurogenic claudication: Secondary | ICD-10-CM | POA: Diagnosis not present

## 2017-07-13 DIAGNOSIS — K219 Gastro-esophageal reflux disease without esophagitis: Secondary | ICD-10-CM | POA: Diagnosis not present

## 2017-07-13 NOTE — Discharge Summary (Signed)
Patient ID: [name] MRN: [id number] DOB/AGE: 1950-04-21 67 y.o.  Admit date: 07/03/2017 Discharge date: 07/13/2017  Admission Diagnoses:  Active Problems:   S/P lumbar spinal fusion   Unilateral primary osteoarthritis, right knee   Anemia due to blood loss   Spinal stenosis, lumbar region with neurogenic claudication   Spondylolisthesis, lumbar region   Discharge Diagnoses:  Active Problems:   S/P lumbar spinal fusion   Unilateral primary osteoarthritis, right knee   Anemia due to blood loss   Spinal stenosis, lumbar region with neurogenic claudication   Spondylolisthesis, lumbar region  status post Procedure(s): Transforaminal lumbar interbody fusion right Lumbar four-five with mPACT pedicle screws and rods, ProTi cage, local bone graft, allograft bone graft, Vivigen, Bilateral Lumbar five-sacral one hemilaminectomy KNEE INJECTION  Past Medical History:  Diagnosis Date  . GERD (gastroesophageal reflux disease)   . Hard of hearing    left side  . Hypothyroid   . Varicose vein of leg     Surgeries: Procedure(s): Transforaminal lumbar interbody fusion right Lumbar four-five with mPACT pedicle screws and rods, ProTi cage, local bone graft, allograft bone graft, Vivigen, Bilateral Lumbar five-sacral one hemilaminectomy KNEE INJECTION on 07/03/2017   Consultants:   Discharged Condition: Improved  Hospital Course: [name] is an 67 y.o. female who was admitted 07/03/2017 for operative treatment of <principal problem not specified>. Patient failed conservative treatments (please see the history and physical for the specifics) and had severe unremitting pain that affects sleep, daily activities and work/hobbies. After pre-op clearance, the patient was taken to the operating room on 07/03/2017 and underwent  Procedure(s): Transforaminal lumbar interbody fusion right Lumbar four-five with mPACT pedicle screws and rods, ProTi cage, local bone graft, allograft bone  graft, Vivigen, Bilateral Lumbar five-sacral one hemilaminectomy KNEE INJECTION.    Patient was given perioperative antibiotics:  Anti-infectives (From admission, onward)   Start     Dose/Rate Route Frequency Ordered Stop   07/03/17 2000  ceFAZolin (ANCEF) IVPB 2g/100 mL premix     2 g 200 mL/hr over 30 Minutes Intravenous Every 8 hours 07/03/17 1658 07/04/17 0431   07/03/17 0630  ceFAZolin (ANCEF) IVPB 2g/100 mL premix     2 g 200 mL/hr over 30 Minutes Intravenous To [facility] 06/30/17 1012 07/03/17 1134       Patient was given sequential compression devices and early ambulation to prevent DVT.   Patient benefited maximally from hospital stay and there were no complications. At the time of discharge, the patient was urinating/moving their bowels without difficulty, tolerating a regular diet, pain is controlled with oral pain medications and they have been cleared by PT/OT.   Recent vital signs: No data found.   Recent laboratory studies: No results for input(s): WBC, HGB, HCT, PLT, NA, K, CL, CO2, BUN, CREATININE, GLUCOSE, INR, CALCIUM in the last 72 hours.  Invalid input(s): PT, 2   Discharge Medications:   Allergies as of 07/06/2017   No Known Allergies     Medication List    STOP taking these medications   HYDROcodone-acetaminophen 5-325 MG tablet Commonly known as:  NORCO/VICODIN     TAKE these medications   acetaminophen 325 MG tablet Commonly known as:  TYLENOL Take 2 tablets (650 mg total) by mouth every 4 (four) hours as needed for mild pain ((score 1 to 3) or temp > 100.5). Notes to patient:  You may take at any time.  Do not take more than 12 tablets in any 24 hour period.  aspirin EC 81 MG tablet Take 81 mg by mouth daily.   CALCIUM 600 + D PO Take 1 tablet by mouth daily.   CO-Q 10 OMEGA-3 FISH OIL PO Take 1 capsule by mouth daily.   DETOX PO Take 2 tablets by mouth daily.   docusate sodium 100 MG capsule Commonly known as:  COLACE Take  1 capsule (100 mg total) by mouth 2 (two) times daily.   ferrous gluconate 324 MG tablet Commonly known as:  FERGON Take 1 tablet (324 mg total) by mouth 2 (two) times daily with a meal.   gabapentin 300 MG capsule Commonly known as:  NEURONTIN Take 1 capsule (300 mg total) by mouth at bedtime.   levothyroxine 88 MCG tablet Commonly known as:  SYNTHROID, LEVOTHROID Take 88 mcg by mouth daily.   methocarbamol 500 MG tablet Commonly known as:  ROBAXIN Take 1 tablet (500 mg total) by mouth every 6 (six) hours as needed for muscle spasms.   multivitamin with minerals Tabs tablet Take 1 tablet by mouth daily.   NON FORMULARY Take 20 drops by mouth daily. CBD Oil   OVER THE COUNTER MEDICATION Take 2 capsules by mouth daily. Detox   OVER THE COUNTER MEDICATION Take 20 drops by mouth daily. CBD Oil   oxyCODONE 5 MG immediate release tablet Commonly known as:  Oxy IR/ROXICODONE Take 1 tablet (5 mg total) by mouth every 4 (four) hours as needed for moderate pain or breakthrough pain ((score 4 to 6)). Patient is post op from lumbar fusion.   PRESCRIPTION MEDICATION Apply 1 application topically daily. Estradiol 2.5 mg and Testosterone 7.5 mg compounded cream   progesterone 100 MG capsule Commonly known as:  PROMETRIUM Take 100 mg by mouth at bedtime. Notes to patient:  Ask your doctor before resuming this medication.       Diagnostic Studies: Dg Lumbar Spine Complete  Result Date: 07/03/2017 CLINICAL DATA:  L4 and L5 fusion EXAM: DG C-ARM 61-120 MIN; LUMBAR SPINE - COMPLETE 4+ VIEW COMPARISON:  February 09, 2017 FLUOROSCOPY TIME:  1 minutes 15 seconds; 4 acquired images FINDINGS: Frontal, lateral, and bilateral oblique views obtained. There is posterior screw and plate fixation at L4 and L5 with pedicle screws in the respective vertebral bodies bilaterally. There is a disc spacer at L4-5. There remains grade I/IV anterolisthesis of L4 on L5. No other evident spondylolisthesis. No  fracture. There is severe disc space narrowing at L5-S1. There is moderately severe disc space narrowing at L2-3. No erosive change. IMPRESSION: Postoperative change at L4 and L5 with support hardware intact. Pedicle screws are in the respective vertebral bodies. Disc spacer present at L4-5. There remains mild anterolisthesis of L4 on L5. No fracture. There is arthropathy at L2-3 and L5-S1. Electronically Signed   By: Lowella Grip III M.D.   On: 07/03/2017 12:23   Dg C-arm 1-60 Min  Result Date: 07/03/2017 CLINICAL DATA:  L4 and L5 fusion EXAM: DG C-ARM 61-120 MIN; LUMBAR SPINE - COMPLETE 4+ VIEW COMPARISON:  February 09, 2017 FLUOROSCOPY TIME:  1 minutes 15 seconds; 4 acquired images FINDINGS: Frontal, lateral, and bilateral oblique views obtained. There is posterior screw and plate fixation at L4 and L5 with pedicle screws in the respective vertebral bodies bilaterally. There is a disc spacer at L4-5. There remains grade I/IV anterolisthesis of L4 on L5. No other evident spondylolisthesis. No fracture. There is severe disc space narrowing at L5-S1. There is moderately severe disc space narrowing at L2-3. No erosive change.  IMPRESSION: Postoperative change at L4 and L5 with support hardware intact. Pedicle screws are in the respective vertebral bodies. Disc spacer present at L4-5. There remains mild anterolisthesis of L4 on L5. No fracture. There is arthropathy at L2-3 and L5-S1. Electronically Signed   By: Lowella Grip III M.D.   On: 07/03/2017 12:23   Dg C-arm 1-60 Min  Result Date: 07/03/2017 CLINICAL DATA:  L4 and L5 fusion EXAM: DG C-ARM 61-120 MIN; LUMBAR SPINE - COMPLETE 4+ VIEW COMPARISON:  February 09, 2017 FLUOROSCOPY TIME:  1 minutes 15 seconds; 4 acquired images FINDINGS: Frontal, lateral, and bilateral oblique views obtained. There is posterior screw and plate fixation at L4 and L5 with pedicle screws in the respective vertebral bodies bilaterally. There is a disc spacer at L4-5. There  remains grade I/IV anterolisthesis of L4 on L5. No other evident spondylolisthesis. No fracture. There is severe disc space narrowing at L5-S1. There is moderately severe disc space narrowing at L2-3. No erosive change. IMPRESSION: Postoperative change at L4 and L5 with support hardware intact. Pedicle screws are in the respective vertebral bodies. Disc spacer present at L4-5. There remains mild anterolisthesis of L4 on L5. No fracture. There is arthropathy at L2-3 and L5-S1. Electronically Signed   By: Lowella Grip III M.D.   On: 07/03/2017 12:23    Discharge Instructions    Call MD / Call 911   Complete by:  As directed    If you experience chest pain or shortness of breath, CALL 911 and be transported to the hospital emergency room.  If you develope a fever above 101 F, pus (white drainage) or increased drainage or redness at the wound, or calf pain, call your surgeon's office.   Constipation Prevention   Complete by:  As directed    Drink plenty of fluids.  Prune juice may be helpful.  You may use a stool softener, such as Colace (over the counter) 100 mg twice a day.  Use MiraLax (over the counter) for constipation as needed.   Diet - low sodium heart healthy   Complete by:  As directed    Discharge instructions   Complete by:  As directed    Call if there is increasing drainage, fever greater than 101.5, severe head aches, and worsening nausea or light sensitivity. If shortness of breath, bloody cough or chest tightness or pain go to an emergency room. No lifting greater than 10 lbs. Avoid bending, stooping and twisting. Use brace when sitting and out of bed even to go to bathroom. Walk in house for first 2 weeks then may start to get out slowly increasing distances up to one mile by 4-6 weeks post op. After 5 days may shower and change dressing following bathing with shower.When bathing remove the brace shower and replace brace before getting out of the shower. If drainage, keep  dry dressing and do not bathe the incision, use an moisture impervious dressing. Please call and return for scheduled follow up appointment 2 weeks from the time of surgery.   Driving restrictions   Complete by:  As directed    No driving for 3 weeks   Increase activity slowly as tolerated   Complete by:  As directed    Lifting restrictions   Complete by:  As directed    No lifting for 8 weeks      Follow-up Information    Jessy Oto, MD In 2 weeks.   Specialty:  Orthopedic Surgery Why:  For wound  re-check Contact information: [place] [address] [phone]           Discharge Plan:  discharge to home  Disposition:     Signed: Benjiman Core  07/13/2017, 8:45 AM

## 2017-07-19 ENCOUNTER — Ambulatory Visit (INDEPENDENT_AMBULATORY_CARE_PROVIDER_SITE_OTHER): Payer: Medicare Other | Admitting: Surgery

## 2017-07-19 ENCOUNTER — Encounter (INDEPENDENT_AMBULATORY_CARE_PROVIDER_SITE_OTHER): Payer: Self-pay | Admitting: Surgery

## 2017-07-19 ENCOUNTER — Ambulatory Visit (INDEPENDENT_AMBULATORY_CARE_PROVIDER_SITE_OTHER): Payer: Medicare Other

## 2017-07-19 VITALS — BP 117/75 | HR 83 | Ht 62.0 in | Wt 180.1 lb

## 2017-07-19 DIAGNOSIS — M4316 Spondylolisthesis, lumbar region: Secondary | ICD-10-CM | POA: Diagnosis not present

## 2017-07-19 DIAGNOSIS — M5136 Other intervertebral disc degeneration, lumbar region: Secondary | ICD-10-CM

## 2017-07-19 DIAGNOSIS — M48062 Spinal stenosis, lumbar region with neurogenic claudication: Secondary | ICD-10-CM

## 2017-07-19 NOTE — Progress Notes (Signed)
12 67-year-old white female who is about 2 weeks out from L4-5 interbody fusion returns.  States that she is doing extremely well.  She is very pleased with her progress up at this point.  Preop leg pain gone.  She has not taken any oxycodone and only using gabapentin 300 mg at night.   Exam Pleasant female alert and oriented in no acute distress.  Surgical incision is healing very well.  No drainage or signs of infection.  Steri-Strips reapplied.  Neurovascular intact.  No focal motor deficits.   Plan Patient will gradually increase her walking over the next several weeks.  Avoid twisting, bending, lifting.  Must continue brace until at least 12 weeks postop.  Follow-up 4 weeks with Dr. Louanne Skye for recheck.  We will repeat x-rays at that time.  Return sooner if needed.

## 2017-07-20 DIAGNOSIS — Z7982 Long term (current) use of aspirin: Secondary | ICD-10-CM | POA: Diagnosis not present

## 2017-07-20 DIAGNOSIS — Z4789 Encounter for other orthopedic aftercare: Secondary | ICD-10-CM | POA: Diagnosis not present

## 2017-07-20 DIAGNOSIS — K219 Gastro-esophageal reflux disease without esophagitis: Secondary | ICD-10-CM | POA: Diagnosis not present

## 2017-07-20 DIAGNOSIS — M1711 Unilateral primary osteoarthritis, right knee: Secondary | ICD-10-CM | POA: Diagnosis not present

## 2017-07-20 DIAGNOSIS — M48061 Spinal stenosis, lumbar region without neurogenic claudication: Secondary | ICD-10-CM | POA: Diagnosis not present

## 2017-07-20 DIAGNOSIS — E039 Hypothyroidism, unspecified: Secondary | ICD-10-CM | POA: Diagnosis not present

## 2017-07-21 DIAGNOSIS — Z4789 Encounter for other orthopedic aftercare: Secondary | ICD-10-CM | POA: Diagnosis not present

## 2017-07-21 DIAGNOSIS — E039 Hypothyroidism, unspecified: Secondary | ICD-10-CM | POA: Diagnosis not present

## 2017-07-21 DIAGNOSIS — M48061 Spinal stenosis, lumbar region without neurogenic claudication: Secondary | ICD-10-CM | POA: Diagnosis not present

## 2017-07-21 DIAGNOSIS — Z7982 Long term (current) use of aspirin: Secondary | ICD-10-CM | POA: Diagnosis not present

## 2017-07-21 DIAGNOSIS — K219 Gastro-esophageal reflux disease without esophagitis: Secondary | ICD-10-CM | POA: Diagnosis not present

## 2017-07-21 DIAGNOSIS — M1711 Unilateral primary osteoarthritis, right knee: Secondary | ICD-10-CM | POA: Diagnosis not present

## 2017-07-31 ENCOUNTER — Other Ambulatory Visit (INDEPENDENT_AMBULATORY_CARE_PROVIDER_SITE_OTHER): Payer: Self-pay | Admitting: Specialist

## 2017-07-31 NOTE — Telephone Encounter (Signed)
gabapentin refill request 

## 2017-08-29 DIAGNOSIS — Z0000 Encounter for general adult medical examination without abnormal findings: Secondary | ICD-10-CM | POA: Diagnosis not present

## 2017-08-29 DIAGNOSIS — D509 Iron deficiency anemia, unspecified: Secondary | ICD-10-CM | POA: Diagnosis not present

## 2017-08-29 DIAGNOSIS — E039 Hypothyroidism, unspecified: Secondary | ICD-10-CM | POA: Diagnosis not present

## 2017-08-29 DIAGNOSIS — E785 Hyperlipidemia, unspecified: Secondary | ICD-10-CM | POA: Diagnosis not present

## 2017-08-29 DIAGNOSIS — Z1389 Encounter for screening for other disorder: Secondary | ICD-10-CM | POA: Diagnosis not present

## 2017-08-29 DIAGNOSIS — Z6834 Body mass index (BMI) 34.0-34.9, adult: Secondary | ICD-10-CM | POA: Diagnosis not present

## 2017-08-29 DIAGNOSIS — J45909 Unspecified asthma, uncomplicated: Secondary | ICD-10-CM | POA: Diagnosis not present

## 2017-09-06 ENCOUNTER — Encounter (INDEPENDENT_AMBULATORY_CARE_PROVIDER_SITE_OTHER): Payer: Self-pay | Admitting: Specialist

## 2017-09-06 ENCOUNTER — Ambulatory Visit (INDEPENDENT_AMBULATORY_CARE_PROVIDER_SITE_OTHER): Payer: Medicare Other

## 2017-09-06 ENCOUNTER — Ambulatory Visit (INDEPENDENT_AMBULATORY_CARE_PROVIDER_SITE_OTHER): Payer: Medicare Other | Admitting: Specialist

## 2017-09-06 VITALS — BP 128/68 | HR 78 | Ht 62.0 in | Wt 180.0 lb

## 2017-09-06 DIAGNOSIS — Z981 Arthrodesis status: Secondary | ICD-10-CM

## 2017-09-06 NOTE — Patient Instructions (Signed)
Avoid frequent bending and stooping  No lifting greater than 10 lbs. May use ice or moist heat for pain. Weight loss is of benefit. May decrease gabapentin to every other evening for one week and then stop it if no change. Hot showers and stretching for the left shoulder.

## 2017-09-06 NOTE — Progress Notes (Signed)
   Post-Op Visit Note   Patient: [name]           Date of Birth: [birth year]           MRN: [id number] Visit Date: 09/06/2017 PCP: Leeroy Cha, MD   Assessment & Plan:2 months post L4-5 TLIF for spondylolisthesis.  Chief Complaint:  Chief Complaint  Patient presents with  . Lower Back - Follow-up   Visit Diagnoses:  1. S/P lumbar spinal fusion     Plan: Avoid frequent bending and stooping  No lifting greater than 10 lbs. May use ice or moist heat for pain. Weight loss is of benefit. May decrease gabapentin to every other evening for one week and then stop it if no change. Hot showers and stretching for the left shoulder.   Follow-Up Instructions: Return in about 4 weeks (around 10/04/2017), or overbook first appointment spot.   Orders:  Orders Placed This Encounter  Procedures  . XR Lumbar Spine 2-3 Views   No orders of the defined types were placed in this encounter.   Imaging: No results found.  PMFS History: Patient Active Problem List   Diagnosis Date Noted  . Unilateral primary osteoarthritis, right knee 07/05/2017    Priority: High    Class: Chronic  . Anemia due to blood loss 07/05/2017    Priority: High    Class: Acute  . Spinal stenosis, lumbar region with neurogenic claudication   . Spondylolisthesis, lumbar region   . S/P lumbar spinal fusion 07/03/2017  . Mixed conductive and sensorineural hearing loss of left ear with restricted hearing of right ear 12/12/2016  . Acute laryngitis 03/10/2016  . Laryngopharyngeal reflux (LPR) 03/10/2016  . Tympanic membrane perforation, right 03/10/2016   Past Medical History:  Diagnosis Date  . GERD (gastroesophageal reflux disease)   . Hard of hearing    left side  . Hypothyroid   . Varicose vein of leg     History reviewed. No pertinent family history.  Past Surgical History:  Procedure Laterality Date  . ABDOMINAL HYSTERECTOMY    . APPENDECTOMY    . cholesteatoma Left   . COLON  SURGERY     part of intestine removed  . FOOT SURGERY Bilateral   . IMPLANTATION BONE ANCHORED HEARING AID Left 01/2017  . INJECTION KNEE Right 07/03/2017   Procedure: KNEE INJECTION;  Surgeon: Jessy Oto, MD;  Location: [facility];  Service: Orthopedics;  Laterality: Right;  . KNEE ARTHROSCOPY Right   . TONSILLECTOMY     Social History   Occupational History  . Not on file  Tobacco Use  . Smoking status: Never Smoker  . Smokeless tobacco: Never Used  Substance and Sexual Activity  . Alcohol use: Yes    Alcohol/week: 1.0 standard drinks    Types: 1 Glasses of wine per week    Comment: daily  . Drug use: Never  . Sexual activity: Not on file

## 2017-09-27 ENCOUNTER — Other Ambulatory Visit (INDEPENDENT_AMBULATORY_CARE_PROVIDER_SITE_OTHER): Payer: Self-pay | Admitting: Specialist

## 2017-09-27 NOTE — Telephone Encounter (Signed)
Gabapentin refill request 

## 2017-10-06 DIAGNOSIS — Z23 Encounter for immunization: Secondary | ICD-10-CM | POA: Diagnosis not present

## 2017-10-11 ENCOUNTER — Encounter (INDEPENDENT_AMBULATORY_CARE_PROVIDER_SITE_OTHER): Payer: Self-pay | Admitting: Specialist

## 2017-10-11 ENCOUNTER — Ambulatory Visit (INDEPENDENT_AMBULATORY_CARE_PROVIDER_SITE_OTHER): Payer: Medicare Other

## 2017-10-11 ENCOUNTER — Ambulatory Visit (INDEPENDENT_AMBULATORY_CARE_PROVIDER_SITE_OTHER): Payer: Medicare Other | Admitting: Specialist

## 2017-10-11 VITALS — BP 139/81 | HR 86 | Ht 62.0 in | Wt 180.0 lb

## 2017-10-11 DIAGNOSIS — Z981 Arthrodesis status: Secondary | ICD-10-CM

## 2017-10-11 NOTE — Progress Notes (Signed)
Office Visit Note   Patient: [name]           Date of Birth: 07/22/1950           MRN: [id number] Visit Date: 10/11/2017              Requested by: Leeroy Cha, MD [address] [place], [place] [address] PCP: Leeroy Cha, MD   Assessment & Plan: Visit Diagnoses:  1. S/P lumbar spinal fusion     Plan:Avoid frequent bending and stooping  No lifting greater than 10 lbs. May use ice or moist heat for pain. Weight loss is of benefit. Best medication for lumbar disc disease is arthritis medications like motrin, celebrex and naprosyn. Exercise is important to improve your indurance and does allow people to function better inspite of back pain. Wean from brace by wearing 1/2 days for 2 weeks then discontinue. Low impact aerobics, stationary bike, eliptical, yoga, stomach crunchs and straight leg raises. Avoid high impact aerobics and jerking type exercise. .  Follow-Up Instructions: No follow-ups on file.   Orders:  Orders Placed This Encounter  Procedures  . XR Lumbar Spine 2-3 Views   No orders of the defined types were placed in this encounter.     Procedures: No procedures performed   Clinical Data: No additional findings.   Subjective: Chief Complaint  Patient presents with  . Lower Back - Routine Post Op    67 year old female with history of lumbar fusion for spondylolisthesis L4-5 with stenosis 14 1/2 weeks ago. No bowel or bladder symptoms. She is out daily not hiking, errands, over 5000 steps per day. No weakness. Incision without difficulty.    Review of Systems  Constitutional: Negative.   HENT: Negative.   Eyes: Negative.   Respiratory: Negative.   Cardiovascular: Negative.   Gastrointestinal: Negative.   Endocrine: Negative.   Genitourinary: Negative.   Musculoskeletal: Negative.   Skin: Negative.   Allergic/Immunologic: Negative.   Neurological: Negative.   Hematological: Negative.     Psychiatric/Behavioral: Negative.      Objective: Vital Signs: BP 139/81 (BP Location: Left Arm, Patient Position: Sitting)   Pulse 86   Ht 5\' 2"  (1.575 m)   Wt 180 lb (81.6 kg)   BMI 32.92 kg/m   Physical Exam  Constitutional: She is oriented to person, place, and time. She appears well-developed and well-nourished.  HENT:  Head: Normocephalic and atraumatic.  Eyes: Pupils are equal, round, and reactive to light. EOM are normal.  Neck: Normal range of motion. Neck supple.  Pulmonary/Chest: Effort normal and breath sounds normal.  Abdominal: Soft. Bowel sounds are normal.  Neurological: She is alert and oriented to person, place, and time.  Skin: Skin is warm and dry.  Psychiatric: She has a normal mood and affect. Her behavior is normal. Judgment and thought content normal.    Back Exam   Tenderness  The patient is experiencing tenderness in the lumbar.  Range of Motion  Extension: abnormal  Flexion: abnormal  Lateral bend right: abnormal  Lateral bend left: abnormal  Rotation right: abnormal  Rotation left: abnormal   Muscle Strength  Right Quadriceps:  5/5  Left Quadriceps:  5/5  Right Hamstrings:  5/5  Left Hamstrings:  5/5   Tests  Straight leg raise right: negative Straight leg raise left: negative  Reflexes  Patellar: 2/4 Achilles: 2/4 Babinski's sign: normal   Other  Toe walk: normal Heel walk: normal Sensation: normal Gait: normal  Erythema: no back redness Scars: present      Specialty Comments:  No specialty comments available.  Imaging: No results found.   PMFS History: Patient Active Problem List   Diagnosis Date Noted  . Unilateral primary osteoarthritis, right knee 07/05/2017    Priority: High    Class: Chronic  . Anemia due to blood loss 07/05/2017    Priority: High    Class: Acute  . Spinal stenosis, lumbar region with neurogenic claudication   . Spondylolisthesis, lumbar region   . S/P lumbar spinal fusion  07/03/2017  . Mixed conductive and sensorineural hearing loss of left ear with restricted hearing of right ear 12/12/2016  . Acute laryngitis 03/10/2016  . Laryngopharyngeal reflux (LPR) 03/10/2016  . Tympanic membrane perforation, right 03/10/2016   Past Medical History:  Diagnosis Date  . GERD (gastroesophageal reflux disease)   . Hard of hearing    left side  . Hypothyroid   . Varicose vein of leg     No family history on file.  Past Surgical History:  Procedure Laterality Date  . ABDOMINAL HYSTERECTOMY    . APPENDECTOMY    . cholesteatoma Left   . COLON SURGERY     part of intestine removed  . FOOT SURGERY Bilateral   . IMPLANTATION BONE ANCHORED HEARING AID Left 01/2017  . INJECTION KNEE Right 07/03/2017   Procedure: KNEE INJECTION;  Surgeon: Jessy Oto, MD;  Location: [facility];  Service: Orthopedics;  Laterality: Right;  . KNEE ARTHROSCOPY Right   . TONSILLECTOMY     Social History   Occupational History  . Not on file  Tobacco Use  . Smoking status: Never Smoker  . Smokeless tobacco: Never Used  Substance and Sexual Activity  . Alcohol use: Yes    Alcohol/week: 1.0 standard drinks    Types: 1 Glasses of wine per week    Comment: daily  . Drug use: Never  . Sexual activity: Not on file

## 2017-10-11 NOTE — Patient Instructions (Addendum)
Avoid frequent bending and stooping  No lifting greater than 10 lbs. May use ice or moist heat for pain. Weight loss is of benefit. Best medication for lumbar disc disease is arthritis medications like motrin, celebrex and naprosyn. Exercise is important to improve your indurance and does allow people to function better inspite of back pain. Wean from brace by wearing 1/2 days for 2 weeks then discontinue. Low impact aerobics, stationary bike, eliptical, yoga, stomach crunchs and straight leg raises. Avoid high impact aerobics and jerking type exercise.

## 2017-11-20 ENCOUNTER — Other Ambulatory Visit (INDEPENDENT_AMBULATORY_CARE_PROVIDER_SITE_OTHER): Payer: Self-pay | Admitting: Specialist

## 2017-11-20 NOTE — Telephone Encounter (Signed)
Gabapentin refill request----90 day supply 

## 2017-11-21 ENCOUNTER — Other Ambulatory Visit (INDEPENDENT_AMBULATORY_CARE_PROVIDER_SITE_OTHER): Payer: Self-pay | Admitting: Specialist

## 2017-11-21 NOTE — Telephone Encounter (Signed)
Gabapentin refill request 

## 2017-11-22 ENCOUNTER — Telehealth (INDEPENDENT_AMBULATORY_CARE_PROVIDER_SITE_OTHER): Payer: Self-pay | Admitting: Orthopedic Surgery

## 2017-11-22 NOTE — Telephone Encounter (Signed)
Returned call to patient left message to return call concerning scheduling an appointment with Dr Marlou Sa.  [phone]

## 2017-12-08 ENCOUNTER — Ambulatory Visit (INDEPENDENT_AMBULATORY_CARE_PROVIDER_SITE_OTHER): Payer: Medicare Other | Admitting: Orthopedic Surgery

## 2017-12-08 ENCOUNTER — Encounter (INDEPENDENT_AMBULATORY_CARE_PROVIDER_SITE_OTHER): Payer: Self-pay | Admitting: Orthopedic Surgery

## 2017-12-08 DIAGNOSIS — M1711 Unilateral primary osteoarthritis, right knee: Secondary | ICD-10-CM

## 2017-12-11 ENCOUNTER — Encounter (INDEPENDENT_AMBULATORY_CARE_PROVIDER_SITE_OTHER): Payer: Self-pay | Admitting: Orthopedic Surgery

## 2017-12-11 DIAGNOSIS — M1711 Unilateral primary osteoarthritis, right knee: Secondary | ICD-10-CM | POA: Diagnosis not present

## 2017-12-11 MED ORDER — METHYLPREDNISOLONE ACETATE 40 MG/ML IJ SUSP
40.0000 mg | INTRAMUSCULAR | Status: AC | PRN
  Administered 2017-12-11: 40 mg via INTRA_ARTICULAR

## 2017-12-11 MED ORDER — BUPIVACAINE HCL 0.25 % IJ SOLN
4.0000 mL | INTRAMUSCULAR | Status: AC | PRN
  Administered 2017-12-11: 4 mL via INTRA_ARTICULAR

## 2017-12-11 MED ORDER — LIDOCAINE HCL 1 % IJ SOLN
5.0000 mL | INTRAMUSCULAR | Status: AC | PRN
  Administered 2017-12-11: 5 mL

## 2017-12-11 NOTE — Progress Notes (Signed)
Office Visit Note   Patient: [name]           Date of Birth: [birth year]           MRN: [id number] Visit Date: 12/08/2017 Requested by: Leeroy Cha, MD [address] [place], [place] [address] PCP: Leeroy Cha, MD  Subjective: Chief Complaint  Patient presents with  . Right Knee - Follow-up    HPI: Patient presents with right knee pain.  She has a known history of right knee arthritis.  She is doing well with her back surgery.  She would like to have a cortisone injection today.  She has a lot of things going on over Thanksgiving and Christmas.  Denies any recent injury.  Denies any mechanical symptoms.              ROS: All systems reviewed are negative as they relate to the chief complaint within the history of present illness.  Patient denies  fevers or chills.   Assessment & Plan: Visit Diagnoses:  1. Unilateral primary osteoarthritis, right knee     Plan: Impression is exacerbation of existing right knee arthritis.  Plan is cortisone injection today with non-loadbearing quad strengthening exercises.  She may need knee replacement sometime in the future.  I will see her back as needed.  Follow-Up Instructions: Return if symptoms worsen or fail to improve.   Orders:  No orders of the defined types were placed in this encounter.  No orders of the defined types were placed in this encounter.     Procedures: Large Joint Inj: R knee on 12/11/2017 12:37 PM Indications: diagnostic evaluation, joint swelling and pain Details: 18 G 1.5 in needle, superolateral approach  Arthrogram: No  Medications: 5 mL lidocaine 1 %; 40 mg methylPREDNISolone acetate 40 MG/ML; 4 mL bupivacaine 0.25 % Outcome: tolerated well, no immediate complications Procedure, treatment alternatives, risks and benefits explained, specific risks discussed. Consent was given by the patient. Immediately prior to procedure a time out was called to verify the correct  patient, procedure, equipment, support staff and site/side marked as required. Patient was prepped and draped in the usual sterile fashion.       Clinical Data: No additional findings.  Objective: Vital Signs: There were no vitals taken for this visit.  Physical Exam:   Constitutional: Patient appears well-developed HEENT:  Head: Normocephalic Eyes:EOM are normal Neck: Normal range of motion Cardiovascular: Normal rate Pulmonary/chest: Effort normal Neurologic: Patient is alert Skin: Skin is warm Psychiatric: Patient has normal mood and affect    Ortho Exam: Ortho exam demonstrates normal gait alignment with no right knee effusion.  Collateral crucial ligaments are stable.  Extensor mechanism is intact.  5 degree flexion contracture is present.  Medial and lateral joint line tenderness is present with bilateral patellofemoral crepitus noted with range of motion.  Specialty Comments:  No specialty comments available.  Imaging: No results found.   PMFS History: Patient Active Problem List   Diagnosis Date Noted  . Unilateral primary osteoarthritis, right knee 07/05/2017    Class: Chronic  . Anemia due to blood loss 07/05/2017    Class: Acute  . Spinal stenosis, lumbar region with neurogenic claudication   . Spondylolisthesis, lumbar region   . S/P lumbar spinal fusion 07/03/2017  . Mixed conductive and sensorineural hearing loss of left ear with restricted hearing of right ear 12/12/2016  . Acute laryngitis 03/10/2016  . Laryngopharyngeal reflux (LPR) 03/10/2016  . Tympanic membrane perforation, right 03/10/2016  Past Medical History:  Diagnosis Date  . GERD (gastroesophageal reflux disease)   . Hard of hearing    left side  . Hypothyroid   . Varicose vein of leg     History reviewed. No pertinent family history.  Past Surgical History:  Procedure Laterality Date  . ABDOMINAL HYSTERECTOMY    . APPENDECTOMY    . cholesteatoma Left   . COLON SURGERY      part of intestine removed  . FOOT SURGERY Bilateral   . IMPLANTATION BONE ANCHORED HEARING AID Left 01/2017  . INJECTION KNEE Right 07/03/2017   Procedure: KNEE INJECTION;  Surgeon: Jessy Oto, MD;  Location: [facility];  Service: Orthopedics;  Laterality: Right;  . KNEE ARTHROSCOPY Right   . TONSILLECTOMY     Social History   Occupational History  . Not on file  Tobacco Use  . Smoking status: Never Smoker  . Smokeless tobacco: Never Used  Substance and Sexual Activity  . Alcohol use: Yes    Alcohol/week: 1.0 standard drinks    Types: 1 Glasses of wine per week    Comment: daily  . Drug use: Never  . Sexual activity: Not on file

## 2017-12-29 ENCOUNTER — Other Ambulatory Visit (INDEPENDENT_AMBULATORY_CARE_PROVIDER_SITE_OTHER): Payer: Self-pay | Admitting: Specialist

## 2017-12-29 NOTE — Telephone Encounter (Signed)
Gabapentin refill request 

## 2017-12-29 NOTE — Telephone Encounter (Signed)
Patient was given last refill November 21, 2017   300 mg 1 tab p.o. QHS number 90 tablets with 1 refill.  With these instructions she should not need a refill at this time.

## 2018-01-31 ENCOUNTER — Ambulatory Visit (INDEPENDENT_AMBULATORY_CARE_PROVIDER_SITE_OTHER): Payer: Self-pay

## 2018-01-31 ENCOUNTER — Encounter (INDEPENDENT_AMBULATORY_CARE_PROVIDER_SITE_OTHER): Payer: Self-pay | Admitting: Specialist

## 2018-01-31 ENCOUNTER — Ambulatory Visit (INDEPENDENT_AMBULATORY_CARE_PROVIDER_SITE_OTHER): Payer: Medicare Other | Admitting: Specialist

## 2018-01-31 VITALS — BP 114/79 | HR 85 | Ht 62.0 in | Wt 185.0 lb

## 2018-01-31 DIAGNOSIS — M180 Bilateral primary osteoarthritis of first carpometacarpal joints: Secondary | ICD-10-CM | POA: Diagnosis not present

## 2018-01-31 DIAGNOSIS — G5601 Carpal tunnel syndrome, right upper limb: Secondary | ICD-10-CM

## 2018-01-31 DIAGNOSIS — Z981 Arthrodesis status: Secondary | ICD-10-CM

## 2018-01-31 DIAGNOSIS — G5602 Carpal tunnel syndrome, left upper limb: Secondary | ICD-10-CM | POA: Diagnosis not present

## 2018-01-31 MED ORDER — GABAPENTIN 300 MG PO CAPS: 300.0000 mg | ORAL_CAPSULE | Freq: Every day | ORAL | 1 refills | Status: DC

## 2018-01-31 NOTE — Patient Instructions (Addendum)
Avoid frequent bending and stooping  No lifting greater than 10 lbs. May use ice or moist heat for pain. Weight loss is of benefit. Best medication for lumbar disc disease is arthritis medications like motrin, celebrex and naprosyn. Exercise is important to improve your indurance and does allow people to function better inspite of back pain. Go see a hand specialist for evaluation of the bilateral thumb MC-C osteoarthritis. Carpal Tunnel Syndrome  Carpal tunnel syndrome is a condition that causes pain in your hand and arm. The carpal tunnel is a narrow area located on the palm side of your wrist. Repeated wrist motion or certain diseases may cause swelling within the tunnel. This swelling pinches the main nerve in the wrist (median nerve). What are the causes? This condition may be caused by:  Repeated wrist motions.  Wrist injuries.  Arthritis.  A cyst or tumor in the carpal tunnel.  Fluid buildup during pregnancy. Sometimes the cause of this condition is not known. What increases the risk? This condition is more likely to develop in:  People who have jobs that cause them to repeatedly move their wrists in the same motion, such as Art gallery manager.  Women.  People with certain conditions, such as: ? Diabetes. ? Obesity. ? An underactive thyroid (hypothyroidism). ? Kidney failure. What are the signs or symptoms? Symptoms of this condition include:  A tingling feeling in your fingers, especially in your thumb, index, and middle fingers.  Tingling or numbness in your hand.  An aching feeling in your entire arm, especially when your wrist and elbow are bent for long periods of time.  Wrist pain that goes up your arm to your shoulder.  Pain that goes down into your palm or fingers.  A weak feeling in your hands. You may have trouble grabbing and holding items. Your symptoms may feel worse during the night. How is this diagnosed? This condition is diagnosed with a  medical history and physical exam. You may also have tests, including:  An electromyogram (EMG). This test measures electrical signals sent by your nerves into the muscles.  X-rays. How is this treated? Treatment for this condition includes:  Lifestyle changes. It is important to stop doing or modify the activity that caused your condition.  Physical or occupational therapy.  Medicines for pain and inflammation. This may include medicine that is injected into your wrist.  A wrist splint.  Surgery. Follow these instructions at home: If you have a splint:   Wear it as told by your health care provider. Remove it only as told by your health care provider.  Loosen the splint if your fingers become numb and tingle, or if they turn cold and blue.  Keep the splint clean and dry. General instructions   Take over-the-counter and prescription medicines only as told by your health care provider.  Rest your wrist from any activity that may be causing your pain. If your condition is work related, talk to your employer about changes that can be made, such as getting a wrist pad to use while typing.  If directed, apply ice to the painful area: ? Put ice in a plastic bag. ? Place a towel between your skin and the bag. ? Leave the ice on for 20 minutes, 2-3 times per day.  Keep all follow-up visits as told by your health care provider. This is important.  Do any exercises as told by your health care provider, physical therapist, or occupational therapist. Contact a health care provider  if:  You have new symptoms.  Your pain is not controlled with medicines.  Your symptoms get worse. This information is not intended to replace advice given to you by your health care provider. Make sure you discuss any questions you have with your health care provider. Document Released: 01/01/2000 Document Revised: 05/14/2015 Document Reviewed: 09/14/2016 Elsevier Interactive Patient Education  2017  [employer].

## 2018-01-31 NOTE — Addendum Note (Signed)
Addended by: [name] on: 01/31/2018 10:24 AM   Modules accepted: Orders

## 2018-01-31 NOTE — Progress Notes (Signed)
Office Visit Note   Patient: [name]           Date of Birth: 1950-10-22           MRN: [id number] Visit Date: 01/31/2018              Requested by: Leeroy Cha, MD [address] [place], [place] [address] PCP: Leeroy Cha, MD   Assessment & Plan: Visit Diagnoses:  1. S/P lumbar spinal fusion     Plan: Avoid frequent bending and stooping  No lifting greater than 10 lbs. May use ice or moist heat for pain. Weight loss is of benefit. Best medication for lumbar disc disease is arthritis medications like motrin, celebrex and naprosyn. Exercise is important to improve your indurance and does allow people to function better inspite of back pain. Go see a hand specialist for evaluation of the bilateral thumb MC-C osteoarthritis. Carpal Tunnel Syndrome  Carpal tunnel syndrome is a condition that causes pain in your hand and arm. The carpal tunnel is a narrow area located on the palm side of your wrist. Repeated wrist motion or certain diseases may cause swelling within the tunnel. This swelling pinches the main nerve in the wrist (median nerve). What are the causes? This condition may be caused by:  Repeated wrist motions.  Wrist injuries.  Arthritis.  A cyst or tumor in the carpal tunnel.  Fluid buildup during pregnancy. Sometimes the cause of this condition is not known. What increases the risk? This condition is more likely to develop in:  People who have jobs that cause them to repeatedly move their wrists in the same motion, such as Art gallery manager.  Women.  People with certain conditions, such as: ? Diabetes. ? Obesity. ? An underactive thyroid (hypothyroidism). ? Kidney failure. What are the signs or symptoms? Symptoms of this condition include:  A tingling feeling in your fingers, especially in your thumb, index, and middle fingers.  Tingling or numbness in your hand.  An aching feeling in your entire arm,  especially when your wrist and elbow are bent for long periods of time.  Wrist pain that goes up your arm to your shoulder.  Pain that goes down into your palm or fingers.  A weak feeling in your hands. You may have trouble grabbing and holding items. Your symptoms may feel worse during the night. How is this diagnosed? This condition is diagnosed with a medical history and physical exam. You may also have tests, including:  An electromyogram (EMG). This test measures electrical signals sent by your nerves into the muscles.  X-rays. How is this treated? Treatment for this condition includes:  Lifestyle changes. It is important to stop doing or modify the activity that caused your condition.  Physical or occupational therapy.  Medicines for pain and inflammation. This may include medicine that is injected into your wrist.  A wrist splint.  Surgery. Follow these instructions at home: If you have a splint:   Wear it as told by your health care provider. Remove it only as told by your health care provider.  Loosen the splint if your fingers become numb and tingle, or if they turn cold and blue.  Keep the splint clean and dry. General instructions   Take over-the-counter and prescription medicines only as told by your health care provider.  Rest your wrist from any activity that may be causing your pain. If your condition is work related, talk to your employer about changes  that can be made, such as getting a wrist pad to use while typing.  If directed, apply ice to the painful area: ? Put ice in a plastic bag. ? Place a towel between your skin and the bag. ? Leave the ice on for 20 minutes, 2-3 times per day.  Keep all follow-up visits as told by your health care provider. This is important.  Do any exercises as told by your health care provider, physical therapist, or occupational therapist. Contact a health care provider if:  You have new symptoms.  Your pain is not  controlled with medicines.  Your symptoms get worse. This information is not intended to replace advice given to you by your health care provider. Make sure you discuss any questions you have with your health care provider. Document Released: 01/01/2000 Document Revised: 05/14/2015 Document Reviewed: 09/14/2016 Elsevier Interactive Patient Education  2017 [employer] Instructions: No follow-ups on file.   Orders:  Orders Placed This Encounter  Procedures  . XR Lumbar Spine 2-3 Views   No orders of the defined types were placed in this encounter.     Procedures: No procedures performed   Clinical Data: No additional findings.   Subjective: Chief Complaint  Patient presents with  . Lower Back - Follow-up    Transforaminal lumbar interbody fusion right Lumbar four-five with mPACT pedicle screws and rods, ProTi cage, local bone graft, allograft bone graft, Vivigen, Bilateral Lumbar five-sacral one hemilaminectomy      68 year old female with 7 month history of lumbar fusion L4-5 with hemilaminectomy Right L5-S1. She has had a protracted car trip from [place] to [place] and then out to [place]. She reports helping her son to move to [place] and rode in a car for 10-12 hours. She had stiffness and back pain post sitting for prolong period. She is sleeping well. Stiffness and back pain is some improve after one to two weeks. She has noticed some numbness in the hands at night. Taking gabapentin 300 mg at night with improvement in the symptoms and is using a wrist splint.   Review of Systems  Constitutional: Negative.   HENT: Negative.   Eyes: Negative.   Respiratory: Negative.   Cardiovascular: Negative.   Gastrointestinal: Negative.   Endocrine: Negative.   Genitourinary: Negative.   Musculoskeletal: Negative.   Skin: Negative.   Allergic/Immunologic: Negative.   Neurological: Negative.   Hematological: Negative.   Psychiatric/Behavioral: Negative.       Objective: Vital Signs: BP 114/79 (BP Location: Left Arm, Patient Position: Sitting)   Pulse 85   Ht 5\' 2"  (1.575 m)   Wt 185 lb (83.9 kg)   BMI 33.84 kg/m   Physical Exam  Back Exam   Tenderness  The patient is experiencing tenderness in the lumbar.  Range of Motion  Extension: abnormal  Flexion: abnormal  Lateral bend right: normal  Lateral bend left: normal  Rotation right: normal  Rotation left: normal   Muscle Strength  Right Quadriceps:  5/5  Left Quadriceps:  5/5  Right Hamstrings:  5/5  Left Hamstrings:  5/5   Tests  Straight leg raise right: negative Straight leg raise left: negative  Reflexes  Patellar: 3/4 Achilles: 3/4 Babinski's sign: normal   Other  Toe walk: normal Heel walk: normal Sensation: normal Gait: normal  Erythema: no back redness Scars: absent      Specialty Comments:  No specialty comments available.  Imaging: No results found.   PMFS History: Patient Active Problem  List   Diagnosis Date Noted  . Unilateral primary osteoarthritis, right knee 07/05/2017    Priority: High    Class: Chronic  . Anemia due to blood loss 07/05/2017    Priority: High    Class: Acute  . Spinal stenosis, lumbar region with neurogenic claudication   . Spondylolisthesis, lumbar region   . S/P lumbar spinal fusion 07/03/2017  . Mixed conductive and sensorineural hearing loss of left ear with restricted hearing of right ear 12/12/2016  . Acute laryngitis 03/10/2016  . Laryngopharyngeal reflux (LPR) 03/10/2016  . Tympanic membrane perforation, right 03/10/2016   Past Medical History:  Diagnosis Date  . GERD (gastroesophageal reflux disease)   . Hard of hearing    left side  . Hypothyroid   . Varicose vein of leg     No family history on file.  Past Surgical History:  Procedure Laterality Date  . ABDOMINAL HYSTERECTOMY    . APPENDECTOMY    . cholesteatoma Left   . COLON SURGERY     part of intestine removed  . FOOT SURGERY  Bilateral   . IMPLANTATION BONE ANCHORED HEARING AID Left 01/2017  . INJECTION KNEE Right 07/03/2017   Procedure: KNEE INJECTION;  Surgeon: Jessy Oto, MD;  Location: [facility];  Service: Orthopedics;  Laterality: Right;  . KNEE ARTHROSCOPY Right   . TONSILLECTOMY     Social History   Occupational History  . Not on file  Tobacco Use  . Smoking status: Never Smoker  . Smokeless tobacco: Never Used  Substance and Sexual Activity  . Alcohol use: Yes    Alcohol/week: 1.0 standard drinks    Types: 1 Glasses of wine per week    Comment: daily  . Drug use: Never  . Sexual activity: Not on file

## 2018-02-05 DIAGNOSIS — Z1231 Encounter for screening mammogram for malignant neoplasm of breast: Secondary | ICD-10-CM | POA: Diagnosis not present

## 2018-02-14 DIAGNOSIS — R200 Anesthesia of skin: Secondary | ICD-10-CM | POA: Diagnosis not present

## 2018-02-14 DIAGNOSIS — M542 Cervicalgia: Secondary | ICD-10-CM | POA: Diagnosis not present

## 2018-02-14 DIAGNOSIS — M180 Bilateral primary osteoarthritis of first carpometacarpal joints: Secondary | ICD-10-CM | POA: Diagnosis not present

## 2018-02-21 ENCOUNTER — Other Ambulatory Visit: Payer: Self-pay | Admitting: Orthopedic Surgery

## 2018-02-21 DIAGNOSIS — G5603 Carpal tunnel syndrome, bilateral upper limbs: Secondary | ICD-10-CM | POA: Diagnosis not present

## 2018-03-13 ENCOUNTER — Encounter (HOSPITAL_BASED_OUTPATIENT_CLINIC_OR_DEPARTMENT_OTHER): Payer: Self-pay | Admitting: *Deleted

## 2018-03-13 ENCOUNTER — Other Ambulatory Visit: Payer: Self-pay

## 2018-03-14 NOTE — Progress Notes (Signed)
Reviewed with Dr.Carnignan that pt has cough and runny nose and she thinks it is related to Allergies. Dr. Almond Lint - ok for surgery.

## 2018-04-06 DIAGNOSIS — H5203 Hypermetropia, bilateral: Secondary | ICD-10-CM | POA: Diagnosis not present

## 2018-04-06 DIAGNOSIS — H401111 Primary open-angle glaucoma, right eye, mild stage: Secondary | ICD-10-CM | POA: Diagnosis not present

## 2018-04-06 DIAGNOSIS — H52221 Regular astigmatism, right eye: Secondary | ICD-10-CM | POA: Diagnosis not present

## 2018-04-06 DIAGNOSIS — H2513 Age-related nuclear cataract, bilateral: Secondary | ICD-10-CM | POA: Diagnosis not present

## 2018-04-06 DIAGNOSIS — H401122 Primary open-angle glaucoma, left eye, moderate stage: Secondary | ICD-10-CM | POA: Diagnosis not present

## 2018-04-08 ENCOUNTER — Emergency Department (HOSPITAL_COMMUNITY)
Admission: EM | Admit: 2018-04-08 | Discharge: 2018-04-08 | Disposition: A | Discharge status: Home / Self Care | Payer: Medicare Other | Attending: Emergency Medicine | Admitting: Emergency Medicine

## 2018-04-08 ENCOUNTER — Encounter (HOSPITAL_COMMUNITY): Payer: Self-pay | Admitting: *Deleted

## 2018-04-08 DIAGNOSIS — E039 Hypothyroidism, unspecified: Secondary | ICD-10-CM | POA: Insufficient documentation

## 2018-04-08 DIAGNOSIS — Z79899 Other long term (current) drug therapy: Secondary | ICD-10-CM | POA: Diagnosis not present

## 2018-04-08 DIAGNOSIS — Z7982 Long term (current) use of aspirin: Secondary | ICD-10-CM | POA: Insufficient documentation

## 2018-04-08 DIAGNOSIS — R0602 Shortness of breath: Secondary | ICD-10-CM | POA: Diagnosis present

## 2018-04-08 DIAGNOSIS — K219 Gastro-esophageal reflux disease without esophagitis: Secondary | ICD-10-CM | POA: Insufficient documentation

## 2018-04-08 DIAGNOSIS — J392 Other diseases of pharynx: Secondary | ICD-10-CM | POA: Diagnosis not present

## 2018-04-08 DIAGNOSIS — R000 Tachycardia, unspecified: Secondary | ICD-10-CM | POA: Diagnosis not present

## 2018-04-08 DIAGNOSIS — R1310 Dysphagia, unspecified: Secondary | ICD-10-CM | POA: Diagnosis not present

## 2018-04-08 MED ORDER — FAMOTIDINE 20 MG PO TABS
20.0000 mg | ORAL_TABLET | Freq: Once | ORAL | Status: AC
  Administered 2018-04-08: 20 mg via ORAL
  Filled 2018-04-08: qty 1

## 2018-04-08 MED ORDER — SUCRALFATE 1 GM/10ML PO SUSP: 1.0000 g | Freq: Three times a day (TID) | ORAL | 0 refills | Status: DC

## 2018-04-08 MED ORDER — ALUM & MAG HYDROXIDE-SIMETH 200-200-20 MG/5ML PO SUSP
30.0000 mL | Freq: Once | ORAL | Status: AC
  Administered 2018-04-08: 30 mL via ORAL
  Filled 2018-04-08: qty 30

## 2018-04-08 MED ORDER — FAMOTIDINE 20 MG PO TABS: 20.0000 mg | ORAL_TABLET | Freq: Two times a day (BID) | ORAL | 0 refills | Status: DC

## 2018-04-08 MED ORDER — PANTOPRAZOLE SODIUM 40 MG PO TBEC
40.0000 mg | DELAYED_RELEASE_TABLET | Freq: Every day | ORAL | Status: DC
  Administered 2018-04-08: 40 mg via ORAL
  Filled 2018-04-08: qty 1

## 2018-04-08 MED ORDER — SUCRALFATE 1 GM/10ML PO SUSP
1.0000 g | Freq: Once | ORAL | Status: AC
  Administered 2018-04-08: 1 g via ORAL
  Filled 2018-04-08: qty 10

## 2018-04-08 MED ORDER — LIDOCAINE VISCOUS HCL 2 % MT SOLN
15.0000 mL | Freq: Once | OROMUCOSAL | Status: AC
  Administered 2018-04-08: 15 mL via ORAL
  Filled 2018-04-08: qty 15

## 2018-04-08 MED ORDER — OMEPRAZOLE 20 MG PO CPDR: DELAYED_RELEASE_CAPSULE | ORAL | 0 refills | Status: DC

## 2018-04-08 NOTE — ED Notes (Signed)
Gi cocktail  given

## 2018-04-08 NOTE — Discharge Instructions (Addendum)
Take medications as prescribed. Follow up with your doctor in 3-4 days for recheck of symptoms.

## 2018-04-08 NOTE — ED Notes (Signed)
The pt reports that she is feeling some better she is not clearing her throat  As often as she was

## 2018-04-08 NOTE — ED Notes (Signed)
Pt feels like her throat is closing again  Pa at bedside

## 2018-04-08 NOTE — ED Notes (Signed)
pts husband is in the waiting room

## 2018-04-08 NOTE — ED Notes (Signed)
ED Provider at bedside. 

## 2018-04-08 NOTE — ED Provider Notes (Signed)
[facility] EMERGENCY DEPARTMENT Provider Note   CSN: [id number] Arrival date & time: 04/08/18  0254    History   Chief Complaint Chief Complaint  Patient presents with  . Shortness of Breath    HPI [name] is a 68 y.o. female.     Patient with history of laryngopharyngeal reflux, osteoarthritis presents with a feeling that her throat is becoming swollen causing difficulty swallowing and like she can't breathe. No intraoral or facial swelling, rash, near syncope, lightheadedness. She denies any new medications or contact with known allergens. No history of similar symptoms. Per EMS, she was able to drink water without difficulty or regurgitation. No drops in her oxygenation. She reports mild heartburn when she went to bed tonight. She ate pizza for dinner. She reports she has been treated for reflux in the past but has not needed medications in some time.   The history is provided by the patient. No language interpreter was used.  Shortness of Breath  Associated symptoms: no chest pain, no diaphoresis, no fever, no neck pain, no rash and no vomiting     Past Medical History:  Diagnosis Date  . GERD (gastroesophageal reflux disease)   . Hard of hearing    left side  . Hypothyroid   . Varicose vein of leg     Patient Active Problem List   Diagnosis Date Noted  . Unilateral primary osteoarthritis, right knee 07/05/2017    Class: Chronic  . Anemia due to blood loss 07/05/2017    Class: Acute  . Spinal stenosis, lumbar region with neurogenic claudication   . Spondylolisthesis, lumbar region   . S/P lumbar spinal fusion 07/03/2017  . Mixed conductive and sensorineural hearing loss of left ear with restricted hearing of right ear 12/12/2016  . Acute laryngitis 03/10/2016  . Laryngopharyngeal reflux (LPR) 03/10/2016  . Tympanic membrane perforation, right 03/10/2016    Past Surgical History:  Procedure Laterality Date  . ABDOMINAL HYSTERECTOMY     . APPENDECTOMY    . cholesteatoma Left   . COLON SURGERY     part of intestine removed  . FOOT SURGERY Bilateral   . IMPLANTATION BONE ANCHORED HEARING AID Left 01/2017  . INJECTION KNEE Right 07/03/2017   Procedure: KNEE INJECTION;  Surgeon: Jessy Oto, MD;  Location: [facility];  Service: Orthopedics;  Laterality: Right;  . KNEE ARTHROSCOPY Right   . SPINAL FUSION     L4 and L5 for spinal stenosis , 06/2017  . TONSILLECTOMY       OB History   No obstetric history on file.      Home Medications    Prior to Admission medications   Medication Sig Start Date End Date Taking? Authorizing Provider  aspirin EC 81 MG tablet Take 81 mg by mouth daily.    [provider]  Calcium Carb-Cholecalciferol (CALCIUM 600 + D PO) Take 1 tablet by mouth daily.    [provider]  gabapentin (NEURONTIN) 300 MG capsule Take 1 capsule (300 mg total) by mouth at bedtime. 01/31/18   Jessy Oto, MD  levothyroxine (SYNTHROID, LEVOTHROID) 100 MCG tablet TAKE 1 TABLET BY MOUTH EVERY DAY ON EMPTY STOMACH IN THE MORNING 07/07/17   [provider]  naproxen sodium (ALEVE) 220 MG tablet Take 220 mg by mouth.    [provider]  Nutritional Supplements (WELLNESS ESSENTIALS FOR WOMEN PO) Take by mouth. Pt takes [employer] Essentials vitamins for women >50    [provider]  simvastatin (ZOCOR) 10 MG tablet every evening. 07/07/17   [provider]    Family History No family history on file.  Social History Social History   Tobacco Use  . Smoking status: Never Smoker  . Smokeless tobacco: Never Used  Substance Use Topics  . Alcohol use: Yes    Alcohol/week: 1.0 standard drinks    Types: 1 Glasses of wine per week    Comment:  drinks wine daily with dinner  . Drug use: Never     Allergies   Patient has no known allergies.   Review of Systems Review of Systems  Constitutional: Negative for diaphoresis and fever.  HENT: Positive for trouble  swallowing. Negative for congestion, facial swelling and voice change.   Respiratory: Positive for shortness of breath.   Cardiovascular: Negative for chest pain.  Gastrointestinal: Negative for vomiting.  Musculoskeletal: Negative for neck pain and neck stiffness.  Skin: Negative for rash.  Neurological: Negative for light-headedness.     Physical Exam Updated Vital Signs BP 123/81 (BP Location: Right Arm)   Pulse 100   Temp 99.1 F (37.3 C) (Oral)   Resp 18   Wt 83.9 kg   SpO2 98%   BMI 33.83 kg/m   Physical Exam Vitals signs and nursing note reviewed.  Constitutional:      Appearance: She is well-developed.  HENT:     Head: Normocephalic.     Mouth/Throat:     Mouth: Mucous membranes are moist.     Pharynx: Oropharynx is clear. No pharyngeal swelling or oropharyngeal exudate.  Neck:     Musculoskeletal: Normal range of motion and neck supple.  Cardiovascular:     Rate and Rhythm: Normal rate and regular rhythm.     Heart sounds: No murmur.  Pulmonary:     Effort: Pulmonary effort is normal.     Breath sounds: Normal breath sounds. No stridor. No wheezing, rhonchi or rales.  Abdominal:     General: Bowel sounds are normal.     Palpations: Abdomen is soft.     Tenderness: There is no abdominal tenderness. There is no guarding or rebound.  Musculoskeletal: Normal range of motion.  Skin:    General: Skin is warm and dry.     Findings: No rash.  Neurological:     Mental Status: She is alert and oriented to person, place, and time.      ED Treatments / Results  Labs (all labs ordered are listed, but only abnormal results are displayed) Labs Reviewed - No data to display  EKG None  Radiology No results found.  Procedures Procedures (including critical care time)  Medications Ordered in ED Medications  alum & mag hydroxide-simeth (MAALOX/MYLANTA) 200-200-20 MG/5ML suspension 30 mL (30 mLs Oral Given 04/08/18 0321)    And  lidocaine (XYLOCAINE) 2 %  viscous mouth solution 15 mL (15 mLs Oral Given 04/08/18 0321)     Initial Impression / Assessment and Plan / ED Course  I have reviewed the triage vital signs and the nursing notes.  Pertinent labs & imaging results that were available during my care of the patient were reviewed by me and considered in my medical decision making (see chart for details).        Patient to ED after waking up with an urgent sense of SOB/difficulty breathing, and/or swallowing.   She appears anxious. Repeatedly clearing her throat. She is managing her secretions. No stridor or hypoxia on exam. No evidence of acute allergic  reaction.   The patient reports mild heartburn after eating pizza tonight. She has a history of laryngopharyngeal reflux remotely. GI cocktail ordered for symptomatic relief. She has been seen by Dr. Stark Jock who is agreeable to plan. Will observe and reassess.   The patient's symptoms were relieved with GI cocktail temporarily but have returned. Feel that symptoms and exam indicate recurrence of reflux. Will start on carafate, Prilosec and Pepcid. Refer to PCP for further management and recheck this week.   Final Clinical Impressions(s) / ED Diagnoses   Final diagnoses:  None   1. Laryngopharyngeal reflux   ED Discharge Orders    None       Charlann Lange, PA-C 04/08/18 0379    Veryl Speak, MD 04/08/18 [phone]

## 2018-04-08 NOTE — ED Notes (Signed)
Pt still clearing her throat

## 2018-04-08 NOTE — ED Triage Notes (Signed)
The pt  Arrived by [facility] from home she woke up from a sound sleep feeling like she could not swallow  And she feels like she cannot breathe.  No swelling in the back of her throat

## 2018-04-08 NOTE — ED Notes (Signed)
Pt doing well at present   Swallowing almost normal

## 2018-04-13 DIAGNOSIS — H401132 Primary open-angle glaucoma, bilateral, moderate stage: Secondary | ICD-10-CM | POA: Diagnosis not present

## 2018-05-28 ENCOUNTER — Other Ambulatory Visit: Payer: Self-pay | Admitting: Orthopedic Surgery

## 2018-05-29 ENCOUNTER — Ambulatory Visit (HOSPITAL_BASED_OUTPATIENT_CLINIC_OR_DEPARTMENT_OTHER): Admission: RE | Admit: 2018-05-29 | Payer: Medicare Other | Source: Home / Self Care | Admitting: Orthopedic Surgery

## 2018-05-29 SURGERY — CARPAL TUNNEL RELEASE
Anesthesia: Regional | Laterality: Right

## 2018-06-19 ENCOUNTER — Other Ambulatory Visit: Payer: Self-pay

## 2018-06-19 ENCOUNTER — Encounter (HOSPITAL_BASED_OUTPATIENT_CLINIC_OR_DEPARTMENT_OTHER): Payer: Self-pay | Admitting: *Deleted

## 2018-06-22 ENCOUNTER — Other Ambulatory Visit: Payer: Self-pay

## 2018-06-22 ENCOUNTER — Other Ambulatory Visit (HOSPITAL_COMMUNITY)
Admission: RE | Admit: 2018-06-22 | Discharge: 2018-06-22 | Disposition: A | Discharge status: Home / Self Care | Payer: Medicare Other | Source: Ambulatory Visit | Attending: Orthopedic Surgery | Admitting: Orthopedic Surgery

## 2018-06-22 DIAGNOSIS — Z01812 Encounter for preprocedural laboratory examination: Secondary | ICD-10-CM | POA: Diagnosis not present

## 2018-06-22 DIAGNOSIS — Z1159 Encounter for screening for other viral diseases: Secondary | ICD-10-CM | POA: Diagnosis not present

## 2018-06-23 LAB — NOVEL CORONAVIRUS, NAA (HOSP ORDER, SEND-OUT TO REF LAB; TAT 18-24 HRS): SARS-CoV-2, NAA: NOT DETECTED

## 2018-06-26 ENCOUNTER — Ambulatory Visit (HOSPITAL_BASED_OUTPATIENT_CLINIC_OR_DEPARTMENT_OTHER)
Admission: RE | Admit: 2018-06-26 | Discharge: 2018-06-26 | Disposition: A | Discharge status: Home / Self Care | Payer: Medicare Other | Attending: Orthopedic Surgery | Admitting: Orthopedic Surgery

## 2018-06-26 ENCOUNTER — Encounter (HOSPITAL_BASED_OUTPATIENT_CLINIC_OR_DEPARTMENT_OTHER)
Admission: RE | Disposition: A | Discharge status: Home / Self Care | Payer: Self-pay | Source: Home / Self Care | Attending: Orthopedic Surgery

## 2018-06-26 ENCOUNTER — Ambulatory Visit (HOSPITAL_BASED_OUTPATIENT_CLINIC_OR_DEPARTMENT_OTHER): Payer: Medicare Other | Admitting: Certified Registered"

## 2018-06-26 ENCOUNTER — Encounter (HOSPITAL_BASED_OUTPATIENT_CLINIC_OR_DEPARTMENT_OTHER): Payer: Self-pay | Admitting: *Deleted

## 2018-06-26 DIAGNOSIS — M4316 Spondylolisthesis, lumbar region: Secondary | ICD-10-CM | POA: Diagnosis not present

## 2018-06-26 DIAGNOSIS — Z6834 Body mass index (BMI) 34.0-34.9, adult: Secondary | ICD-10-CM | POA: Diagnosis not present

## 2018-06-26 DIAGNOSIS — M48062 Spinal stenosis, lumbar region with neurogenic claudication: Secondary | ICD-10-CM | POA: Diagnosis not present

## 2018-06-26 DIAGNOSIS — M1990 Unspecified osteoarthritis, unspecified site: Secondary | ICD-10-CM | POA: Diagnosis not present

## 2018-06-26 DIAGNOSIS — G5603 Carpal tunnel syndrome, bilateral upper limbs: Secondary | ICD-10-CM | POA: Diagnosis not present

## 2018-06-26 DIAGNOSIS — E669 Obesity, unspecified: Secondary | ICD-10-CM | POA: Diagnosis not present

## 2018-06-26 DIAGNOSIS — G5602 Carpal tunnel syndrome, left upper limb: Secondary | ICD-10-CM | POA: Diagnosis not present

## 2018-06-26 DIAGNOSIS — E039 Hypothyroidism, unspecified: Secondary | ICD-10-CM | POA: Diagnosis not present

## 2018-06-26 HISTORY — PX: CARPAL TUNNEL RELEASE: SHX101

## 2018-06-26 SURGERY — CARPAL TUNNEL RELEASE
Anesthesia: Monitor Anesthesia Care | Site: Wrist | Laterality: Left

## 2018-06-26 MED ORDER — LACTATED RINGERS IV SOLN
INTRAVENOUS | Status: DC
  Administered 2018-06-26: 08:00:00 via INTRAVENOUS

## 2018-06-26 MED ORDER — ONDANSETRON HCL 4 MG/2ML IJ SOLN
INTRAMUSCULAR | Status: AC
  Filled 2018-06-26: qty 2

## 2018-06-26 MED ORDER — CEFAZOLIN SODIUM-DEXTROSE 2-4 GM/100ML-% IV SOLN
INTRAVENOUS | Status: AC
  Filled 2018-06-26: qty 100

## 2018-06-26 MED ORDER — CHLORHEXIDINE GLUCONATE 4 % EX LIQD: 60.0000 mL | Freq: Once | CUTANEOUS | Status: DC

## 2018-06-26 MED ORDER — LIDOCAINE 2% (20 MG/ML) 5 ML SYRINGE
INTRAMUSCULAR | Status: AC
  Filled 2018-06-26: qty 5

## 2018-06-26 MED ORDER — LIDOCAINE HCL (PF) 1 % IJ SOLN
INTRAMUSCULAR | Status: DC | PRN
  Administered 2018-06-26: 30 mL

## 2018-06-26 MED ORDER — METOCLOPRAMIDE HCL 5 MG/ML IJ SOLN: 10.0000 mg | Freq: Once | INTRAMUSCULAR | Status: DC | PRN

## 2018-06-26 MED ORDER — FENTANYL CITRATE (PF) 100 MCG/2ML IJ SOLN: 25.0000 ug | INTRAMUSCULAR | Status: DC | PRN

## 2018-06-26 MED ORDER — SCOPOLAMINE 1 MG/3DAYS TD PT72: 1.0000 | MEDICATED_PATCH | Freq: Once | TRANSDERMAL | Status: DC | PRN

## 2018-06-26 MED ORDER — FENTANYL CITRATE (PF) 100 MCG/2ML IJ SOLN
INTRAMUSCULAR | Status: DC | PRN
  Administered 2018-06-26: 25 ug via INTRAVENOUS
  Administered 2018-06-26: 50 ug via INTRAVENOUS

## 2018-06-26 MED ORDER — LIDOCAINE HCL (CARDIAC) PF 100 MG/5ML IV SOSY
PREFILLED_SYRINGE | INTRAVENOUS | Status: DC | PRN
  Administered 2018-06-26: 50 mg via INTRAVENOUS

## 2018-06-26 MED ORDER — FENTANYL CITRATE (PF) 100 MCG/2ML IJ SOLN
INTRAMUSCULAR | Status: AC
  Filled 2018-06-26: qty 2

## 2018-06-26 MED ORDER — BUPIVACAINE HCL (PF) 0.25 % IJ SOLN
INTRAMUSCULAR | Status: DC | PRN
  Administered 2018-06-26: 10 mL

## 2018-06-26 MED ORDER — OXYCODONE HCL 5 MG PO TABS: 5.0000 mg | ORAL_TABLET | Freq: Once | ORAL | Status: DC | PRN

## 2018-06-26 MED ORDER — TRAMADOL HCL 50 MG PO TABS: 50.0000 mg | ORAL_TABLET | Freq: Four times a day (QID) | ORAL | 0 refills | Status: DC | PRN

## 2018-06-26 MED ORDER — MIDAZOLAM HCL 2 MG/2ML IJ SOLN
INTRAMUSCULAR | Status: AC
  Filled 2018-06-26: qty 2

## 2018-06-26 MED ORDER — MIDAZOLAM HCL 2 MG/2ML IJ SOLN: 1.0000 mg | INTRAMUSCULAR | Status: DC | PRN

## 2018-06-26 MED ORDER — PROPOFOL 500 MG/50ML IV EMUL
INTRAVENOUS | Status: DC | PRN
  Administered 2018-06-26: 50 ug/kg/min via INTRAVENOUS

## 2018-06-26 MED ORDER — PROPOFOL 500 MG/50ML IV EMUL
INTRAVENOUS | Status: AC
  Filled 2018-06-26: qty 50

## 2018-06-26 MED ORDER — MIDAZOLAM HCL 5 MG/5ML IJ SOLN
INTRAMUSCULAR | Status: DC | PRN
  Administered 2018-06-26 (×2): 1 mg via INTRAVENOUS

## 2018-06-26 MED ORDER — ONDANSETRON HCL 4 MG/2ML IJ SOLN
INTRAMUSCULAR | Status: DC | PRN
  Administered 2018-06-26: 4 mg via INTRAVENOUS

## 2018-06-26 MED ORDER — FENTANYL CITRATE (PF) 100 MCG/2ML IJ SOLN: 50.0000 ug | INTRAMUSCULAR | Status: DC | PRN

## 2018-06-26 MED ORDER — OXYCODONE HCL 5 MG/5ML PO SOLN: 5.0000 mg | Freq: Once | ORAL | Status: DC | PRN

## 2018-06-26 MED ORDER — CEFAZOLIN SODIUM-DEXTROSE 2-4 GM/100ML-% IV SOLN
2.0000 g | INTRAVENOUS | Status: AC
  Administered 2018-06-26: 2 g via INTRAVENOUS

## 2018-06-26 SURGICAL SUPPLY — 39 items
APL PRP STRL LF DISP 70% ISPRP (MISCELLANEOUS) ×1
BLADE SURG 15 STRL LF DISP TIS (BLADE) ×1 IMPLANT
BLADE SURG 15 STRL SS (BLADE) ×3
BNDG CMPR 9X4 STRL LF SNTH (GAUZE/BANDAGES/DRESSINGS)
BNDG COHESIVE 3X5 TAN STRL LF (GAUZE/BANDAGES/DRESSINGS) ×3 IMPLANT
BNDG ESMARK 4X9 LF (GAUZE/BANDAGES/DRESSINGS) IMPLANT
BNDG GAUZE ELAST 4 BULKY (GAUZE/BANDAGES/DRESSINGS) ×3 IMPLANT
CHLORAPREP W/TINT 26 (MISCELLANEOUS) ×3 IMPLANT
CORD BIPOLAR FORCEPS 12FT (ELECTRODE) ×3 IMPLANT
COVER BACK TABLE REUSABLE LG (DRAPES) ×3 IMPLANT
COVER MAYO STAND REUSABLE (DRAPES) ×3 IMPLANT
COVER WAND RF STERILE (DRAPES) IMPLANT
CUFF TOURN SGL QUICK 18X4 (TOURNIQUET CUFF) ×3 IMPLANT
DRAPE EXTREMITY T 121X128X90 (DISPOSABLE) ×3 IMPLANT
DRAPE SURG 17X23 STRL (DRAPES) ×3 IMPLANT
DRSG PAD ABDOMINAL 8X10 ST (GAUZE/BANDAGES/DRESSINGS) ×3 IMPLANT
GAUZE SPONGE 4X4 12PLY STRL (GAUZE/BANDAGES/DRESSINGS) ×3 IMPLANT
GAUZE XEROFORM 1X8 LF (GAUZE/BANDAGES/DRESSINGS) ×3 IMPLANT
GLOVE BIO SURGEON STRL SZ 6.5 (GLOVE) ×1 IMPLANT
GLOVE BIO SURGEONS STRL SZ 6.5 (GLOVE) ×1
GLOVE BIOGEL PI IND STRL 7.0 (GLOVE) IMPLANT
GLOVE BIOGEL PI IND STRL 8.5 (GLOVE) ×1 IMPLANT
GLOVE BIOGEL PI INDICATOR 7.0 (GLOVE) ×4
GLOVE BIOGEL PI INDICATOR 8.5 (GLOVE) ×2
GLOVE SURG ORTHO 8.0 STRL STRW (GLOVE) ×3 IMPLANT
GOWN STRL REUS W/ TWL LRG LVL3 (GOWN DISPOSABLE) ×1 IMPLANT
GOWN STRL REUS W/TWL LRG LVL3 (GOWN DISPOSABLE) ×3
GOWN STRL REUS W/TWL XL LVL3 (GOWN DISPOSABLE) ×3 IMPLANT
NDL PRECISIONGLIDE 27X1.5 (NEEDLE) IMPLANT
NEEDLE PRECISIONGLIDE 27X1.5 (NEEDLE) ×3 IMPLANT
NS IRRIG 1000ML POUR BTL (IV SOLUTION) ×3 IMPLANT
PACK BASIN DAY SURGERY FS (CUSTOM PROCEDURE TRAY) ×3 IMPLANT
STOCKINETTE 4X48 STRL (DRAPES) ×3 IMPLANT
SUT ETHILON 4 0 PS 2 18 (SUTURE) ×3 IMPLANT
SUT VICRYL 4-0 PS2 18IN ABS (SUTURE) IMPLANT
SYR BULB 3OZ (MISCELLANEOUS) ×3 IMPLANT
SYR CONTROL 10ML LL (SYRINGE) ×2 IMPLANT
TOWEL GREEN STERILE FF (TOWEL DISPOSABLE) ×3 IMPLANT
UNDERPAD 30X30 (UNDERPADS AND DIAPERS) ×3 IMPLANT

## 2018-06-26 NOTE — Anesthesia Postprocedure Evaluation (Signed)
Anesthesia Post Note  Patient: [name]  Procedure(s) Performed: CARPAL TUNNEL RELEASE (Left Wrist)     Patient location during evaluation: PACU Anesthesia Type: MAC and Bier Block Level of consciousness: awake and alert and oriented Pain management: pain level controlled Vital Signs Assessment: post-procedure vital signs reviewed and stable Respiratory status: spontaneous breathing, nonlabored ventilation and respiratory function stable Cardiovascular status: stable and blood pressure returned to baseline Postop Assessment: no apparent nausea or vomiting Anesthetic complications: no    Last Vitals:  Vitals:   06/26/18 0915 06/26/18 0930  BP: 110/65 104/68  Pulse: 80 80  Resp: 13 19  Temp:    SpO2: 97% 95%    Last Pain:  Vitals:   06/26/18 0930  TempSrc:   PainSc: 0-No pain                 Yaphet Smethurst A.

## 2018-06-26 NOTE — Op Note (Signed)
NAME: [name] MEDICAL RECORD NO: [id number] DATE OF BIRTH: 12-Jan-1951 FACILITY: [facility] LOCATION: [facility] PHYSICIAN: Wynonia Sours, MD   OPERATIVE REPORT   DATE OF PROCEDURE: 06/26/18    PREOPERATIVE DIAGNOSIS:   Carpal tunnel syndrome left hand   POSTOPERATIVE DIAGNOSIS:   Same   PROCEDURE:   Decompression median nerve left wrist   SURGEON: Daryll Brod, M.D.   ASSISTANT: none   ANESTHESIA:  Bier block with sedation and Local   INTRAVENOUS FLUIDS:  Per anesthesia flow sheet.   ESTIMATED BLOOD LOSS:  Minimal.   COMPLICATIONS:  None.   SPECIMENS:  none   TOURNIQUET TIME:    Total Tourniquet Time Documented: Forearm (Left) - 17 minutes Total: Forearm (Left) - 17 minutes    DISPOSITION:  Stable to PACU.   INDICATIONS: Patient is a 68 year old female with a history of bilateral numbness and tingling EMG nerve conductions positive for this not responded to conservative treatment.  She is like to undergo surgical decompression of the median nerve left hand originally scheduled for right.  Operative area the patient is seen the extremity marked by both patient and surgeon antibiotic given  OPERATIVE COURSE: She is brought to the operating room where form based IV regional anesthetic was carried out without difficulty.  She was prepped using ChloraPrep in a supine position with a left arm free.  A three-minute dry time was allowed timeout taken to confirm patient procedure.  A longitudinal incision was made left palm carried down through subcutaneous tissue.  Bleeders were electrocauterized with bipolar.  The palmar fascia was split.  The superficial palmar arch identified.  The area ulnar muscle was present crossing over the entire axial retinaculum this was dissected free from its radial aspect.  This allowed visualization of the flexor retinaculum which was released on its ulnar aspect.  An angled saw retractor placed between skin and forearm fascia  the area was bluntly dissected along with the deep tissues.  This allowed visualization of the proximal portion of the flexor retinaculum distal forearm fascia was was released for approximately 2 cm proximal to the wrist crease under direct vision.  The canal was explored.  An area compression of the nerve was apparent.  No further lesions were identified.  Motor branch entered in the muscle centrally.  The wound was copiously irrigated with saline.  Skin was closed erupted 4 nylon sutures.  Local infiltration quarter percent bupivacaine without epinephrine was given approximately 8 cc was used.  Sterile compressive dressing with the fingers free was applied.  Inflation of the tourniquet all fingers immediately pink.  She was taken to the recovery room for observation in satisfactory condition.  She will be discharged home to return hand center [facility] in 1 week on Tylenol ibuprofen for pain with Ultram as a backup.   Daryll Brod, MD Electronically signed, 06/26/18

## 2018-06-26 NOTE — Discharge Instructions (Addendum)

## 2018-06-26 NOTE — Anesthesia Preprocedure Evaluation (Signed)
Anesthesia Evaluation  Patient identified by MRN, date of birth, ID band Patient awake    Reviewed: Allergy & Precautions, NPO status , Patient's Chart, lab work & pertinent test results  Airway Mallampati: III  TM Distance: >3 FB Neck ROM: Full    Dental no notable dental hx. (+) Teeth Intact   Pulmonary neg pulmonary ROS,    Pulmonary exam normal breath sounds clear to auscultation       Cardiovascular negative cardio ROS Normal cardiovascular exam Rhythm:Regular Rate:Normal     Neuro/Psych HOH left Right CTS negative psych ROS   GI/Hepatic Neg liver ROS, GERD  Medicated and Controlled,  Endo/Other  Hypothyroidism Obesity  Renal/GU negative Renal ROS  negative genitourinary   Musculoskeletal  (+) Arthritis , Osteoarthritis,    Abdominal   Peds  Hematology  (+) anemia ,   Anesthesia Other Findings   Reproductive/Obstetrics                             Anesthesia Physical Anesthesia Plan  ASA: II  Anesthesia Plan: Bier Block and MAC and Bier Block-LIDOCAINE ONLY   Post-op Pain Management:    Induction:   PONV Risk Score and Plan: 2 and Ondansetron, Propofol infusion, Midazolam and Treatment may vary due to age or medical condition  Airway Management Planned: Natural Airway, Nasal Cannula and Simple Face Mask  Additional Equipment:   Intra-op Plan:   Post-operative Plan:   Informed Consent: I have reviewed the patients History and Physical, chart, labs and discussed the procedure including the risks, benefits and alternatives for the proposed anesthesia with the patient or authorized representative who has indicated his/her understanding and acceptance.     Dental advisory given  Plan Discussed with: CRNA and Surgeon  Anesthesia Plan Comments:         Anesthesia Quick Evaluation

## 2018-06-26 NOTE — Transfer of Care (Signed)
Immediate Anesthesia Transfer of Care Note  Patient: [name]  Procedure(s) Performed: CARPAL TUNNEL RELEASE (Left Wrist)  Patient Location: PACU  Anesthesia Type:Bier block  Level of Consciousness: awake, alert  and oriented  Airway & Oxygen Therapy: Patient Spontanous Breathing and Patient connected to nasal cannula oxygen  Post-op Assessment: Report given to RN and Post -op Vital signs reviewed and stable  Post vital signs: Reviewed and stable  Last Vitals:  Vitals Value Taken Time  BP    Temp    Pulse 86 06/26/2018  9:11 AM  Resp 9 06/26/2018  9:11 AM  SpO2 94 % 06/26/2018  9:11 AM  Vitals shown include unvalidated device data.  Last Pain:  Vitals:   06/26/18 0721  TempSrc: Oral  PainSc: 3          Complications: No apparent anesthesia complications

## 2018-06-26 NOTE — Brief Op Note (Signed)
06/26/2018  9:13 AM  PATIENT:  [name]  68 y.o. female  PRE-OPERATIVE DIAGNOSIS:  LEFT CARPAL TUNNEL SYNDROME  POST-OPERATIVE DIAGNOSIS:  LEFT CARPAL TUNNEL SYNDROME  PROCEDURE:  Procedure(s): CARPAL TUNNEL RELEASE (Left)  SURGEON:  Surgeon(s) and Role:    * Daryll Brod, MD - Primary  PHYSICIAN ASSISTANT:   ASSISTANTS: none   ANESTHESIA:   local, regional and IV sedation  EBL:  0 mL   BLOOD ADMINISTERED:none  DRAINS: none   LOCAL MEDICATIONS USED:  BUPIVICAINE   SPECIMEN:  No Specimen  DISPOSITION OF SPECIMEN:  N/A  COUNTS:  YES  TOURNIQUET:   Total Tourniquet Time Documented: Forearm (Left) - 17 minutes Total: Forearm (Left) - 17 minutes   DICTATION: .Viviann Spare Dictation  PLAN OF CARE: Discharge to home after PACU  PATIENT DISPOSITION:  PACU - hemodynamically stable.
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
## 2018-06-27 ENCOUNTER — Encounter (HOSPITAL_BASED_OUTPATIENT_CLINIC_OR_DEPARTMENT_OTHER): Payer: Self-pay | Admitting: Orthopedic Surgery

## 2018-07-20 ENCOUNTER — Other Ambulatory Visit (INDEPENDENT_AMBULATORY_CARE_PROVIDER_SITE_OTHER): Payer: Self-pay | Admitting: Specialist

## 2018-07-23 NOTE — Telephone Encounter (Signed)
Gabapentin refill request 

## 2018-07-26 ENCOUNTER — Encounter: Payer: Self-pay | Admitting: Orthopedic Surgery

## 2018-07-26 ENCOUNTER — Ambulatory Visit (INDEPENDENT_AMBULATORY_CARE_PROVIDER_SITE_OTHER): Payer: Medicare Other | Admitting: Orthopedic Surgery

## 2018-07-26 ENCOUNTER — Other Ambulatory Visit: Payer: Self-pay

## 2018-07-26 DIAGNOSIS — M1711 Unilateral primary osteoarthritis, right knee: Secondary | ICD-10-CM

## 2018-07-26 MED ORDER — BUPIVACAINE HCL 0.25 % IJ SOLN
4.0000 mL | INTRAMUSCULAR | Status: AC | PRN
  Administered 2018-07-26: 4 mL via INTRA_ARTICULAR

## 2018-07-26 MED ORDER — METHYLPREDNISOLONE ACETATE 40 MG/ML IJ SUSP
40.0000 mg | INTRAMUSCULAR | Status: AC | PRN
  Administered 2018-07-26: 40 mg via INTRA_ARTICULAR

## 2018-07-26 MED ORDER — LIDOCAINE HCL 1 % IJ SOLN
5.0000 mL | INTRAMUSCULAR | Status: AC | PRN
  Administered 2018-07-26: 5 mL

## 2018-07-26 NOTE — Progress Notes (Signed)
Office Visit Note   Patient: [name]           Date of Birth: Aug 29, 1950           MRN: [id number] Visit Date: 07/26/2018 Requested by: Leeroy Cha, MD [address] [place],  [place] [address] PCP: Leeroy Cha, MD  Subjective: No chief complaint on file.   HPI: Patient presents for evaluation of right knee pain.  She has known history of arthritis in this knee.  She has had back surgery and is doing reasonably well from that.  The knee is bothering her and interfering with her walking endurance.              ROS: All systems reviewed are negative as they relate to the chief complaint within the history of present illness.  Patient denies  fevers or chills.   Assessment & Plan: Visit Diagnoses: No diagnosis found.  Plan: Impression is end-stage arthritis in the right knee.  Nothing she is considering knee replacement sometime later this year.  Would like to have a cortisone shot to try to get through the summer with less pain.  That is performed today with aspiration.  I will see her back as needed.  I did tell her to give [place] about 6 or 8 weeks for notice in 2 to 3 months if she wants to get that knee replaced.  Follow-Up Instructions: Return if symptoms worsen or fail to improve.   Orders:  No orders of the defined types were placed in this encounter.  No orders of the defined types were placed in this encounter.     Procedures: Large Joint Inj: R knee on 07/26/2018 10:28 PM Indications: diagnostic evaluation, joint swelling and pain Details: 18 G 1.5 in needle, superolateral approach  Arthrogram: No  Medications: 5 mL lidocaine 1 %; 40 mg methylPREDNISolone acetate 40 MG/ML; 4 mL bupivacaine 0.25 % Outcome: tolerated well, no immediate complications Procedure, treatment alternatives, risks and benefits explained, specific risks discussed. Consent was given by the patient. Immediately prior to procedure a time out was called to  verify the correct patient, procedure, equipment, support staff and site/side marked as required. Patient was prepped and draped in the usual sterile fashion.       Clinical Data: No additional findings.  Objective: Vital Signs: There were no vitals taken for this visit.  Physical Exam:   Constitutional: Patient appears well-developed HEENT:  Head: Normocephalic Eyes:EOM are normal Neck: Normal range of motion Cardiovascular: Normal rate Pulmonary/chest: Effort normal Neurologic: Patient is alert Skin: Skin is warm Psychiatric: Patient has normal mood and affect    Ortho Exam: Ortho exam demonstrates full active and passive range of motion of the hip and ankle.  On that right knee she has about a 10 degree flexion contracture but can bend past 90.  Mild effusion is present.  Mild varicosities also present.  Extensor mechanism is intact.  Ankle dorsiflexion plantarflexion is intact with palpable pedal pulses.  Specialty Comments:  No specialty comments available.  Imaging: No results found.   PMFS History: Patient Active Problem List   Diagnosis Date Noted  . Unilateral primary osteoarthritis, right knee 07/05/2017    Class: Chronic  . Anemia due to blood loss 07/05/2017    Class: Acute  . Spinal stenosis, lumbar region with neurogenic claudication   . Spondylolisthesis, lumbar region   . S/P lumbar spinal fusion 07/03/2017  . Mixed conductive and sensorineural hearing loss of left ear  with restricted hearing of right ear 12/12/2016  . Acute laryngitis 03/10/2016  . Laryngopharyngeal reflux (LPR) 03/10/2016  . Tympanic membrane perforation, right 03/10/2016   Past Medical History:  Diagnosis Date  . GERD (gastroesophageal reflux disease)   . Hard of hearing    left side  . Hypothyroid   . Varicose vein of leg     History reviewed. No pertinent family history.  Past Surgical History:  Procedure Laterality Date  . ABDOMINAL HYSTERECTOMY    . APPENDECTOMY     . CARPAL TUNNEL RELEASE Left 06/26/2018   Procedure: CARPAL TUNNEL RELEASE;  Surgeon: Daryll Brod, MD;  Location: [facility];  Service: Orthopedics;  Laterality: Left;  . cholesteatoma Left   . COLON SURGERY     part of intestine removed  . FOOT SURGERY Bilateral   . IMPLANTATION BONE ANCHORED HEARING AID Left 01/2017  . INJECTION KNEE Right 07/03/2017   Procedure: KNEE INJECTION;  Surgeon: Jessy Oto, MD;  Location: [facility];  Service: Orthopedics;  Laterality: Right;  . KNEE ARTHROSCOPY Right   . SPINAL FUSION     L4 and L5 for spinal stenosis , 06/2017  . TONSILLECTOMY     Social History   Occupational History  . Not on file  Tobacco Use  . Smoking status: Never Smoker  . Smokeless tobacco: Never Used  Substance and Sexual Activity  . Alcohol use: Yes    Alcohol/week: 1.0 standard drinks    Types: 1 Glasses of wine per week    Comment:  drinks wine daily with dinner  . Drug use: Never  . Sexual activity: Yes

## 2018-08-07 DIAGNOSIS — H5203 Hypermetropia, bilateral: Secondary | ICD-10-CM | POA: Diagnosis not present

## 2018-08-07 DIAGNOSIS — H401131 Primary open-angle glaucoma, bilateral, mild stage: Secondary | ICD-10-CM | POA: Diagnosis not present

## 2018-08-07 DIAGNOSIS — H524 Presbyopia: Secondary | ICD-10-CM | POA: Diagnosis not present

## 2018-08-28 DIAGNOSIS — H5203 Hypermetropia, bilateral: Secondary | ICD-10-CM | POA: Diagnosis not present

## 2018-08-28 DIAGNOSIS — H524 Presbyopia: Secondary | ICD-10-CM | POA: Diagnosis not present

## 2018-08-28 DIAGNOSIS — H401131 Primary open-angle glaucoma, bilateral, mild stage: Secondary | ICD-10-CM | POA: Diagnosis not present

## 2018-09-05 DIAGNOSIS — Z1389 Encounter for screening for other disorder: Secondary | ICD-10-CM | POA: Diagnosis not present

## 2018-09-05 DIAGNOSIS — D509 Iron deficiency anemia, unspecified: Secondary | ICD-10-CM | POA: Diagnosis not present

## 2018-09-05 DIAGNOSIS — Z0000 Encounter for general adult medical examination without abnormal findings: Secondary | ICD-10-CM | POA: Diagnosis not present

## 2018-09-05 DIAGNOSIS — J45909 Unspecified asthma, uncomplicated: Secondary | ICD-10-CM | POA: Diagnosis not present

## 2018-09-05 DIAGNOSIS — Z23 Encounter for immunization: Secondary | ICD-10-CM | POA: Diagnosis not present

## 2018-09-05 DIAGNOSIS — M1710 Unilateral primary osteoarthritis, unspecified knee: Secondary | ICD-10-CM | POA: Diagnosis not present

## 2018-09-05 DIAGNOSIS — E785 Hyperlipidemia, unspecified: Secondary | ICD-10-CM | POA: Diagnosis not present

## 2018-09-05 DIAGNOSIS — Z79899 Other long term (current) drug therapy: Secondary | ICD-10-CM | POA: Diagnosis not present

## 2018-09-05 DIAGNOSIS — E039 Hypothyroidism, unspecified: Secondary | ICD-10-CM | POA: Diagnosis not present

## 2018-09-05 DIAGNOSIS — E669 Obesity, unspecified: Secondary | ICD-10-CM | POA: Diagnosis not present

## 2018-09-10 ENCOUNTER — Telehealth: Payer: Self-pay | Admitting: Orthopedic Surgery

## 2018-09-10 NOTE — Telephone Encounter (Signed)
Please advise. Do you want her to have ROV to discuss or call her?

## 2018-09-10 NOTE — Telephone Encounter (Signed)
Pt called in said she would like to discuss surgery for her right knee and would like to go ahead and schedule it for sometime in early November.   [phone]

## 2018-09-12 NOTE — Telephone Encounter (Signed)
Blue sheet done pls have debbie at least call today

## 2018-09-13 DIAGNOSIS — Z1159 Encounter for screening for other viral diseases: Secondary | ICD-10-CM | POA: Diagnosis not present

## 2018-09-20 ENCOUNTER — Other Ambulatory Visit: Payer: Self-pay

## 2018-10-01 DIAGNOSIS — Z23 Encounter for immunization: Secondary | ICD-10-CM | POA: Diagnosis not present

## 2018-10-19 ENCOUNTER — Ambulatory Visit (INDEPENDENT_AMBULATORY_CARE_PROVIDER_SITE_OTHER): Payer: Medicare Other

## 2018-10-19 ENCOUNTER — Encounter: Payer: Self-pay | Admitting: Orthopedic Surgery

## 2018-10-19 ENCOUNTER — Ambulatory Visit (INDEPENDENT_AMBULATORY_CARE_PROVIDER_SITE_OTHER): Payer: Medicare Other | Admitting: Orthopedic Surgery

## 2018-10-19 ENCOUNTER — Ambulatory Visit: Payer: Self-pay

## 2018-10-19 DIAGNOSIS — M25562 Pain in left knee: Secondary | ICD-10-CM | POA: Diagnosis not present

## 2018-10-19 DIAGNOSIS — M25561 Pain in right knee: Secondary | ICD-10-CM

## 2018-10-19 NOTE — Progress Notes (Signed)
Office Visit Note   Patient: [name]           Date of Birth: 18-Mar-1950           MRN: [id number] Visit Date: 10/19/2018 Requested by: Leeroy Cha, MD [address] [place],  [place] [address] PCP: Leeroy Cha, MD  Subjective: Chief Complaint  Patient presents with  . Right Knee - Pain    HPI: [name] is a patient with known right knee arthritis.  She is scheduled for knee replacement in the near future.  She has questions to discuss.  She is doing well from her back surgery.  She does report daily pain and night pain and rest pain.  Multiple questions about knee surgery including anesthesia postop care rehabilitation are discussed at length.              ROS: All systems reviewed are negative as they relate to the chief complaint within the history of present illness.  Patient denies  fevers or chills.   Assessment & Plan: Visit Diagnoses:  1. Pain in both knees, unspecified chronicity     Plan: Impression is severe end-stage bilateral knee arthritis worse on the right-hand side.  She is failed conservative measures.  She is had prior knee arthroscopy on the right many years ago.  She wants to have a more active lifestyle.  Planning on big trip late spring I think she will be ready for that by the time it arrives  Follow-Up Instructions: No follow-ups on file.   Orders:  Orders Placed This Encounter  Procedures  . XR KNEE 3 VIEW RIGHT  . XR KNEE 3 VIEW LEFT   No orders of the defined types were placed in this encounter.     Procedures: No procedures performed   Clinical Data: No additional findings.  Objective: Vital Signs: There were no vitals taken for this visit.  Physical Exam:   Constitutional: Patient appears well-developed HEENT:  Head: Normocephalic Eyes:EOM are normal Neck: Normal range of motion Cardiovascular: Normal rate Pulmonary/chest: Effort normal Neurologic: Patient is alert Skin: Skin is warm  Psychiatric: Patient has normal mood and affect    Ortho Exam: Ortho exam demonstrates full active and passive range of motion of the hips and ankles.  Right knee has less than 5 degree flexion contracture and flexion to about 105 degrees.  Collateral crucial ligaments are stable patient has global knee pain but no effusion.  Extensor mechanism is intact skin is intact.  Specialty Comments:  No specialty comments available.  Imaging: Xr Knee 3 View Left  Result Date: 10/19/2018 AP lateral merchant of left and right knee reviewed.  Both knees have mild varus alignment.  Severe end-stage arthritis is present in the right knee.  Moderate to severe end-stage arthritis is present in the left knee.  In both knees the medial compartment is most affected.  No acute fracture or dislocation.  Bone quality appears reasonable.  Xr Knee 3 View Right  Result Date: 10/19/2018 2 views right knee show severe end-stage tricompartmental arthritis with spurring bone-on-bone tricompartmental changes.  Arthritis has progressed compared to radiographs from 2017    PMFS History: Patient Active Problem List   Diagnosis Date Noted  . Unilateral primary osteoarthritis, right knee 07/05/2017    Class: Chronic  . Anemia due to blood loss 07/05/2017    Class: Acute  . Spinal stenosis, lumbar region with neurogenic claudication   . Spondylolisthesis, lumbar region   . S/P lumbar  spinal fusion 07/03/2017  . Mixed conductive and sensorineural hearing loss of left ear with restricted hearing of right ear 12/12/2016  . Acute laryngitis 03/10/2016  . Laryngopharyngeal reflux (LPR) 03/10/2016  . Tympanic membrane perforation, right 03/10/2016   Past Medical History:  Diagnosis Date  . GERD (gastroesophageal reflux disease)   . Hard of hearing    left side  . Hypothyroid   . Varicose vein of leg     History reviewed. No pertinent family history.  Past Surgical History:  Procedure Laterality Date  .  ABDOMINAL HYSTERECTOMY    . APPENDECTOMY    . CARPAL TUNNEL RELEASE Left 06/26/2018   Procedure: CARPAL TUNNEL RELEASE;  Surgeon: Daryll Brod, MD;  Location: [facility];  Service: Orthopedics;  Laterality: Left;  . cholesteatoma Left   . COLON SURGERY     part of intestine removed  . FOOT SURGERY Bilateral   . IMPLANTATION BONE ANCHORED HEARING AID Left 01/2017  . INJECTION KNEE Right 07/03/2017   Procedure: KNEE INJECTION;  Surgeon: Jessy Oto, MD;  Location: [facility];  Service: Orthopedics;  Laterality: Right;  . KNEE ARTHROSCOPY Right   . SPINAL FUSION     L4 and L5 for spinal stenosis , 06/2017  . TONSILLECTOMY     Social History   Occupational History  . Not on file  Tobacco Use  . Smoking status: Never Smoker  . Smokeless tobacco: Never Used  Substance and Sexual Activity  . Alcohol use: Yes    Alcohol/week: 1.0 standard drinks    Types: 1 Glasses of wine per week    Comment:  drinks wine daily with dinner  . Drug use: Never  . Sexual activity: Yes

## 2018-11-08 DIAGNOSIS — E039 Hypothyroidism, unspecified: Secondary | ICD-10-CM | POA: Diagnosis not present

## 2018-11-08 DIAGNOSIS — Z23 Encounter for immunization: Secondary | ICD-10-CM | POA: Diagnosis not present

## 2018-11-08 DIAGNOSIS — R7989 Other specified abnormal findings of blood chemistry: Secondary | ICD-10-CM | POA: Diagnosis not present

## 2018-11-08 DIAGNOSIS — R946 Abnormal results of thyroid function studies: Secondary | ICD-10-CM | POA: Diagnosis not present

## 2018-11-20 NOTE — Progress Notes (Signed)
[id number] - [place],  - [place]. AT [address]. [place] [address] Phone: [phone] Fax: [phone]    Your procedure is scheduled on Tuesday, November 10th.  Report to [facility] Main Entrance "A" at 5:30 A.M., and check in at the Admitting office.  Call this number if you have problems the morning of surgery:  [phone]  Call [phone] if you have any questions prior to your surgery date Monday-Friday 8am-4pm   Remember:  Do not eat after midnight the night before your surgery  You may drink clear liquids until 4:15 A.M. the morning of your surgery.   Clear liquids allowed are: Water, Non-Citrus Juices (without pulp), Carbonated Beverages, Clear Tea, Black Coffee Only, and Gatorade   Please complete your PRE-SURGERY ENSURE that was provided to you by 4:15 A.M. the morning of surgery.  Please, if able, drink it in one setting. DO NOT SIP.   Take these medicines the morning of surgery with A SIP OF WATER  levothyroxine (SYNTHROID)  timolol (TIMOPTIC)/eye drops  Follow your surgeon's instructions on when to stop Aspirin.  If no instructions were given by your surgeon then you will need to call the office to get those instructions.    As of today, STOP taking any Aspirin (unless otherwise instructed by your surgeon), Aleve, Naproxen, Ibuprofen, Motrin, Advil, Goody's, BC's, all herbal medications, fish oil, and all vitamins.   The Morning of Surgery  Do not wear jewelry, make-up or nail polish.  Do not wear lotions, powders, perfumesor deodorant  Do not shave 48 hours prior to surgery.   Do not bring valuables to the hospital.  [facility] is not responsible for any belongings or valuables.  If you are a smoker, DO NOT Smoke 24 hours prior to surgery IF you wear a CPAP at night please bring your mask, tubing, and machine the morning of surgery   Remember that you must have someone to transport you  home after your surgery, and remain with you for 24 hours if you are discharged the same day.  Contacts, glasses, hearing aids, dentures or bridgework may not be worn into surgery.   Leave your suitcase in the car.  After surgery it may be brought to your room.  For patients admitted to the hospital, discharge time will be determined by your treatment team.  Patients discharged the day of surgery will not be allowed to drive home.   Special instructions:   [facility]- Preparing For Surgery  Before surgery, you can play an important role. Because skin is not sterile, your skin needs to be as free of germs as possible. You can reduce the number of germs on your skin by washing with CHG (chlorahexidine gluconate) Soap before surgery.  CHG is an antiseptic cleaner which kills germs and bonds with the skin to continue killing germs even after washing.    Oral Hygiene is also important to reduce your risk of infection.  Remember - BRUSH YOUR TEETH THE MORNING OF SURGERY WITH YOUR REGULAR TOOTHPASTE  Please do not use if you have an allergy to CHG or antibacterial soaps. If your skin becomes reddened/irritated stop using the CHG.  Do not shave (including legs and underarms) for at least 48 hours prior to first CHG shower. It is OK to shave your face.  Please follow these instructions carefully.   1. Shower the NIGHT BEFORE SURGERY and the MORNING OF SURGERY with CHG Soap.   2. If  you chose to wash your hair, wash your hair first as usual with your normal shampoo.  3. After you shampoo, rinse your hair and body thoroughly to remove the shampoo.  4. Use CHG as you would any other liquid soap. You can apply CHG directly to the skin and wash gently with a scrungie or a clean washcloth.   5. Apply the CHG Soap to your body ONLY FROM THE NECK DOWN.  Do not use on open wounds or open sores. Avoid contact with your eyes, ears, mouth and genitals (private parts). Wash Face and genitals (private parts)   with your normal soap.   6. Wash thoroughly, paying special attention to the area where your surgery will be performed.  7. Thoroughly rinse your body with warm water from the neck down.  8. DO NOT shower/wash with your normal soap after using and rinsing off the CHG Soap.  9. Pat yourself dry with a CLEAN TOWEL.  10. Wear CLEAN PAJAMAS to bed the night before surgery, wear comfortable clothes the morning of surgery  11. Place CLEAN SHEETS on your bed the night of your first shower and DO NOT SLEEP WITH PETS.  Day of Surgery: Do not apply any deodorants/lotions. Please shower the morning of surgery with the CHG soap  Please wear clean clothes to the hospital/surgery center.   Remember to brush your teeth WITH YOUR REGULAR TOOTHPASTE.  Please read over the following fact sheets that you were given.

## 2018-11-21 ENCOUNTER — Encounter (HOSPITAL_COMMUNITY): Payer: Self-pay

## 2018-11-21 ENCOUNTER — Encounter (HOSPITAL_COMMUNITY)
Admission: RE | Admit: 2018-11-21 | Discharge: 2018-11-21 | Disposition: A | Discharge status: Home / Self Care | Payer: Medicare Other | Source: Ambulatory Visit | Attending: Orthopedic Surgery | Admitting: Orthopedic Surgery

## 2018-11-21 ENCOUNTER — Other Ambulatory Visit: Payer: Self-pay

## 2018-11-21 DIAGNOSIS — M1711 Unilateral primary osteoarthritis, right knee: Secondary | ICD-10-CM | POA: Diagnosis not present

## 2018-11-21 DIAGNOSIS — Z01812 Encounter for preprocedural laboratory examination: Secondary | ICD-10-CM | POA: Insufficient documentation

## 2018-11-21 HISTORY — DX: Pneumonia, unspecified organism: J18.9

## 2018-11-21 HISTORY — DX: Unspecified osteoarthritis, unspecified site: M19.90

## 2018-11-21 HISTORY — DX: Carpal tunnel syndrome, unspecified upper limb: G56.00

## 2018-11-21 LAB — BASIC METABOLIC PANEL
Anion gap: 8 (ref 5–15)
BUN: 18 mg/dL (ref 8–23)
CO2: 28 mmol/L (ref 22–32)
Calcium: 9.7 mg/dL (ref 8.9–10.3)
Chloride: 106 mmol/L (ref 98–111)
Creatinine, Ser: 0.72 mg/dL (ref 0.44–1.00)
GFR calc Af Amer: >60 mL/min (ref >60)
GFR calc non Af Amer: >60 mL/min (ref >60)
Glucose, Bld: 81 mg/dL (ref 70–99)
Potassium: 4.9 mmol/L (ref 3.5–5.1)
Sodium: 142 mmol/L (ref 135–145)

## 2018-11-21 LAB — CBC
HCT: 43.9 % (ref 36.0–46.0)
Hemoglobin: 14.3 g/dL (ref 12.0–15.0)
MCH: 31.1 pg (ref 26.0–34.0)
MCHC: 32.6 g/dL (ref 30.0–36.0)
MCV: 95.4 fL (ref 80.0–100.0)
Platelets: 229 10*3/uL (ref 150–400)
RBC: 4.6 MIL/uL (ref 3.87–5.11)
RDW: 13.5 % (ref 11.5–15.5)
WBC: 6.4 10*3/uL (ref 4.0–10.5)
nRBC: 0 % (ref 0.0–0.2)

## 2018-11-21 LAB — SURGICAL PCR SCREEN
MRSA, PCR: NEGATIVE
Staphylococcus aureus: POSITIVE — AB

## 2018-11-21 NOTE — Progress Notes (Signed)
PCP - Dr. Leeroy Cha Cardiologist - denies  PPM/ICD - denies Device Orders - N/A Rep Notified - N/A  Chest x-ray - N/A EKG - N/A Stress Test - denies ECHO - denies Cardiac Cath - denies  Sleep Study - denies CPAP - N/A  Blood Thinner Instructions: N/A Aspirin Instructions: Patient instructed to contact surgeons office  ERAS Protcol - Yes PRE-SURGERY Ensure or G2- Ensure given  COVID TEST- Scheduled for Friday, 11/23/2018. Patient verbalized understanding of self-quarantine instructions, appointment time, and place.  Anesthesia review: No  Patient denies shortness of breath, fever, cough and chest pain at PAT appointment  All instructions explained to the patient, with a verbal understanding of the material. Patient agrees to go over the instructions while at home for a better understanding. Patient also instructed to self quarantine after being tested for COVID-19. The opportunity to ask questions was provided.

## 2018-11-22 DIAGNOSIS — H401131 Primary open-angle glaucoma, bilateral, mild stage: Secondary | ICD-10-CM | POA: Diagnosis not present

## 2018-11-22 LAB — URINE CULTURE: Culture: <10000 — AB

## 2018-11-23 ENCOUNTER — Other Ambulatory Visit (HOSPITAL_COMMUNITY)
Admission: RE | Admit: 2018-11-23 | Discharge: 2018-11-23 | Disposition: A | Discharge status: Home / Self Care | Payer: Medicare Other | Source: Ambulatory Visit | Attending: Orthopedic Surgery | Admitting: Orthopedic Surgery

## 2018-11-23 DIAGNOSIS — Z01812 Encounter for preprocedural laboratory examination: Secondary | ICD-10-CM | POA: Insufficient documentation

## 2018-11-23 DIAGNOSIS — Z20828 Contact with and (suspected) exposure to other viral communicable diseases: Secondary | ICD-10-CM | POA: Insufficient documentation

## 2018-11-24 LAB — NOVEL CORONAVIRUS, NAA (HOSP ORDER, SEND-OUT TO REF LAB; TAT 18-24 HRS): SARS-CoV-2, NAA: NOT DETECTED

## 2018-11-26 ENCOUNTER — Telehealth: Payer: Self-pay | Admitting: *Deleted

## 2018-11-26 NOTE — Care Plan (Signed)
RNCM call to patient for Ortho bundle pre-op call. Reviewed ortho bundle program and patient was able to ask appropriate questions and review post-op care related to her scheduled Right TKA on 11/27/18 per Dr. Marlou Sa. Patient verbalized that her home CPM has been delivered today by [employer]. Her husband will be able to assist her at home after short stay in the hospital. She has a RW and elevated toilet seat. No DME needs. She has also requested home health PT be set up with [facility] as she has used them in the past. Referral made to liaison. Will be scheduling OPPT in office at [facility]. F/U with Dr. Marlou Sa scheduled for 12/12/18. Will continue to monitor for all CM needs.

## 2018-11-26 NOTE — Telephone Encounter (Signed)
Ortho bundle pre-op call completed. 

## 2018-11-26 NOTE — Anesthesia Preprocedure Evaluation (Addendum)
Anesthesia Evaluation  Patient identified by MRN, date of birth, ID band Patient awake    Reviewed: Allergy & Precautions, NPO status , Patient's Chart, lab work & pertinent test results  Airway Mallampati: II  TM Distance: >3 FB Neck ROM: Full    Dental no notable dental hx. (+) Teeth Intact, Dental Advisory Given   Pulmonary neg pulmonary ROS,    Pulmonary exam normal breath sounds clear to auscultation       Cardiovascular negative cardio ROS Normal cardiovascular exam Rhythm:Regular Rate:Normal     Neuro/Psych negative neurological ROS  negative psych ROS   GI/Hepatic Neg liver ROS, GERD  Medicated and Controlled,  Endo/Other  Hypothyroidism   Renal/GU negative Renal ROS     Musculoskeletal  (+) Arthritis ,   Abdominal (+) + obese,   Peds  Hematology HLD   Anesthesia Other Findings right knee osteoarthritis  Reproductive/Obstetrics                           Anesthesia Physical Anesthesia Plan  ASA: II  Anesthesia Plan: Spinal and Regional   Post-op Pain Management:  Regional for Post-op pain   Induction: Intravenous  PONV Risk Score and Plan: 2 and Ondansetron, Dexamethasone, Midazolam and Treatment may vary due to age or medical condition  Airway Management Planned: Natural Airway  Additional Equipment:   Intra-op Plan:   Post-operative Plan:   Informed Consent: I have reviewed the patients History and Physical, chart, labs and discussed the procedure including the risks, benefits and alternatives for the proposed anesthesia with the patient or authorized representative who has indicated his/her understanding and acceptance.     Dental advisory given  Plan Discussed with: CRNA  Anesthesia Plan Comments:         Anesthesia Quick Evaluation

## 2018-11-27 ENCOUNTER — Other Ambulatory Visit: Payer: Self-pay

## 2018-11-27 ENCOUNTER — Inpatient Hospital Stay (HOSPITAL_COMMUNITY)
Admission: RE | Admit: 2018-11-27 | Discharge: 2018-11-30 | DRG: 470 | Disposition: A | Discharge status: Home Health | Payer: Medicare Other | Attending: Orthopedic Surgery | Admitting: Orthopedic Surgery

## 2018-11-27 ENCOUNTER — Ambulatory Visit (HOSPITAL_COMMUNITY): Payer: Medicare Other | Admitting: Anesthesiology

## 2018-11-27 ENCOUNTER — Encounter (HOSPITAL_COMMUNITY): Payer: Self-pay | Admitting: *Deleted

## 2018-11-27 ENCOUNTER — Encounter (HOSPITAL_COMMUNITY)
Admission: RE | Disposition: A | Discharge status: Home Health | Payer: Self-pay | Source: Home / Self Care | Attending: Orthopedic Surgery

## 2018-11-27 DIAGNOSIS — E785 Hyperlipidemia, unspecified: Secondary | ICD-10-CM | POA: Diagnosis not present

## 2018-11-27 DIAGNOSIS — E039 Hypothyroidism, unspecified: Secondary | ICD-10-CM | POA: Diagnosis not present

## 2018-11-27 DIAGNOSIS — Z79899 Other long term (current) drug therapy: Secondary | ICD-10-CM | POA: Diagnosis not present

## 2018-11-27 DIAGNOSIS — H9190 Unspecified hearing loss, unspecified ear: Secondary | ICD-10-CM | POA: Diagnosis present

## 2018-11-27 DIAGNOSIS — Z79890 Hormone replacement therapy (postmenopausal): Secondary | ICD-10-CM | POA: Diagnosis not present

## 2018-11-27 DIAGNOSIS — Z981 Arthrodesis status: Secondary | ICD-10-CM

## 2018-11-27 DIAGNOSIS — Z90710 Acquired absence of both cervix and uterus: Secondary | ICD-10-CM

## 2018-11-27 DIAGNOSIS — M1711 Unilateral primary osteoarthritis, right knee: Secondary | ICD-10-CM | POA: Diagnosis present

## 2018-11-27 DIAGNOSIS — G8918 Other acute postprocedural pain: Secondary | ICD-10-CM | POA: Diagnosis not present

## 2018-11-27 HISTORY — PX: TOTAL KNEE ARTHROPLASTY: SHX125

## 2018-11-27 LAB — URINALYSIS, ROUTINE W REFLEX MICROSCOPIC
Bilirubin Urine: NEGATIVE
Glucose, UA: NEGATIVE mg/dL
Hgb urine dipstick: NEGATIVE
Ketones, ur: NEGATIVE mg/dL
Nitrite: NEGATIVE
Protein, ur: NEGATIVE mg/dL
Specific Gravity, Urine: 1.013 (ref 1.005–1.030)
pH: 5 (ref 5.0–8.0)

## 2018-11-27 SURGERY — ARTHROPLASTY, KNEE, TOTAL
Anesthesia: Regional | Site: Knee | Laterality: Right

## 2018-11-27 MED ORDER — PROPOFOL 10 MG/ML IV BOLUS
INTRAVENOUS | Status: AC
  Filled 2018-11-27: qty 20

## 2018-11-27 MED ORDER — MORPHINE SULFATE (PF) 4 MG/ML IV SOLN: INTRAVENOUS | Status: DC | PRN

## 2018-11-27 MED ORDER — ACETAMINOPHEN 500 MG PO TABS
1000.0000 mg | ORAL_TABLET | Freq: Once | ORAL | Status: AC
  Administered 2018-11-27: 1000 mg via ORAL
  Filled 2018-11-27: qty 2

## 2018-11-27 MED ORDER — POVIDONE-IODINE 10 % EX SWAB: 2.0000 "application " | Freq: Once | CUTANEOUS | Status: DC

## 2018-11-27 MED ORDER — FENTANYL CITRATE (PF) 100 MCG/2ML IJ SOLN
INTRAMUSCULAR | Status: DC | PRN
  Administered 2018-11-27 (×2): 50 ug via INTRAVENOUS
  Administered 2018-11-27: 100 ug via INTRAVENOUS
  Administered 2018-11-27: 50 ug via INTRAVENOUS

## 2018-11-27 MED ORDER — BUPIVACAINE IN DEXTROSE 0.75-8.25 % IT SOLN
INTRATHECAL | Status: DC | PRN
  Administered 2018-11-27: 1.7 mL via INTRATHECAL

## 2018-11-27 MED ORDER — SODIUM CHLORIDE 0.9 % IR SOLN
Status: DC | PRN
  Administered 2018-11-27: 3000 mL

## 2018-11-27 MED ORDER — ACETAMINOPHEN 325 MG PO TABS: 325.0000 mg | ORAL_TABLET | Freq: Four times a day (QID) | ORAL | Status: DC | PRN

## 2018-11-27 MED ORDER — ONDANSETRON HCL 4 MG PO TABS: 4.0000 mg | ORAL_TABLET | Freq: Four times a day (QID) | ORAL | Status: DC | PRN

## 2018-11-27 MED ORDER — LIDOCAINE HCL (CARDIAC) PF 100 MG/5ML IV SOSY
PREFILLED_SYRINGE | INTRAVENOUS | Status: DC | PRN
  Administered 2018-11-27: 10 mg via INTRATRACHEAL

## 2018-11-27 MED ORDER — CLONIDINE HCL (ANALGESIA) 100 MCG/ML EP SOLN: EPIDURAL | Status: DC | PRN

## 2018-11-27 MED ORDER — TRANEXAMIC ACID 1000 MG/10ML IV SOLN
2000.0000 mg | Freq: Once | INTRAVENOUS | Status: DC
  Filled 2018-11-27: qty 20

## 2018-11-27 MED ORDER — CLONIDINE HCL (ANALGESIA) 100 MCG/ML EP SOLN
EPIDURAL | Status: AC
  Filled 2018-11-27: qty 10

## 2018-11-27 MED ORDER — FENTANYL CITRATE (PF) 250 MCG/5ML IJ SOLN
INTRAMUSCULAR | Status: AC
  Filled 2018-11-27: qty 5

## 2018-11-27 MED ORDER — PHENOL 1.4 % MT LIQD: 1.0000 | OROMUCOSAL | Status: DC | PRN

## 2018-11-27 MED ORDER — ONDANSETRON HCL 4 MG/2ML IJ SOLN: 4.0000 mg | Freq: Four times a day (QID) | INTRAMUSCULAR | Status: DC | PRN

## 2018-11-27 MED ORDER — MIDAZOLAM HCL 5 MG/5ML IJ SOLN
INTRAMUSCULAR | Status: DC | PRN
  Administered 2018-11-27: 2 mg via INTRAVENOUS

## 2018-11-27 MED ORDER — METHOCARBAMOL 1000 MG/10ML IJ SOLN
500.0000 mg | Freq: Four times a day (QID) | INTRAVENOUS | Status: DC | PRN
  Filled 2018-11-27: qty 5

## 2018-11-27 MED ORDER — 0.9 % SODIUM CHLORIDE (POUR BTL) OPTIME
TOPICAL | Status: DC | PRN
  Administered 2018-11-27 (×2): 1000 mL

## 2018-11-27 MED ORDER — LACTATED RINGERS IV SOLN
INTRAVENOUS | Status: DC | PRN
  Administered 2018-11-27 (×2): via INTRAVENOUS

## 2018-11-27 MED ORDER — PHENYLEPHRINE HCL-NACL 10-0.9 MG/250ML-% IV SOLN
INTRAVENOUS | Status: DC | PRN
  Administered 2018-11-27: 35 ug/min via INTRAVENOUS

## 2018-11-27 MED ORDER — TRANEXAMIC ACID 1000 MG/10ML IV SOLN
INTRAVENOUS | Status: DC | PRN
  Administered 2018-11-27: 2000 mg via TOPICAL

## 2018-11-27 MED ORDER — MIDAZOLAM HCL 2 MG/2ML IJ SOLN
INTRAMUSCULAR | Status: AC
  Filled 2018-11-27: qty 2

## 2018-11-27 MED ORDER — NAPROXEN 250 MG PO TABS
250.0000 mg | ORAL_TABLET | Freq: Two times a day (BID) | ORAL | Status: DC
  Administered 2018-11-27 – 2018-11-30 (×6): 250 mg via ORAL
  Filled 2018-11-27 (×7): qty 1

## 2018-11-27 MED ORDER — TRANEXAMIC ACID-NACL 1000-0.7 MG/100ML-% IV SOLN
INTRAVENOUS | Status: AC
  Filled 2018-11-27: qty 100

## 2018-11-27 MED ORDER — BUPIVACAINE LIPOSOME 1.3 % IJ SUSP
INTRAMUSCULAR | Status: DC | PRN
  Administered 2018-11-27: 20 mL

## 2018-11-27 MED ORDER — PROPOFOL 10 MG/ML IV BOLUS
INTRAVENOUS | Status: DC | PRN
  Administered 2018-11-27: 150 mg via INTRAVENOUS

## 2018-11-27 MED ORDER — PROPOFOL 500 MG/50ML IV EMUL
INTRAVENOUS | Status: DC | PRN
  Administered 2018-11-27: 25 ug/kg/min via INTRAVENOUS

## 2018-11-27 MED ORDER — METOCLOPRAMIDE HCL 5 MG PO TABS: 5.0000 mg | ORAL_TABLET | Freq: Three times a day (TID) | ORAL | Status: DC | PRN

## 2018-11-27 MED ORDER — IRRISEPT - 450ML BOTTLE WITH 0.05% CHG IN STERILE WATER, USP 99.95% OPTIME
TOPICAL | Status: DC | PRN
  Administered 2018-11-27: 450 mL

## 2018-11-27 MED ORDER — CHLORHEXIDINE GLUCONATE 4 % EX LIQD: 60.0000 mL | Freq: Once | CUTANEOUS | Status: DC

## 2018-11-27 MED ORDER — ONDANSETRON HCL 4 MG/2ML IJ SOLN: 4.0000 mg | Freq: Once | INTRAMUSCULAR | Status: DC | PRN

## 2018-11-27 MED ORDER — METHOCARBAMOL 500 MG PO TABS
500.0000 mg | ORAL_TABLET | Freq: Four times a day (QID) | ORAL | Status: DC | PRN
  Administered 2018-11-27 – 2018-11-29 (×3): 500 mg via ORAL
  Filled 2018-11-27 (×3): qty 1

## 2018-11-27 MED ORDER — SODIUM CHLORIDE 0.9% FLUSH
INTRAVENOUS | Status: DC | PRN
  Administered 2018-11-27: 20 mL

## 2018-11-27 MED ORDER — EPHEDRINE SULFATE 50 MG/ML IJ SOLN
INTRAMUSCULAR | Status: DC | PRN
  Administered 2018-11-27 (×2): 10 mg via INTRAVENOUS

## 2018-11-27 MED ORDER — DOCUSATE SODIUM 100 MG PO CAPS
100.0000 mg | ORAL_CAPSULE | Freq: Two times a day (BID) | ORAL | Status: DC
  Administered 2018-11-27 – 2018-11-30 (×6): 100 mg via ORAL
  Filled 2018-11-27 (×6): qty 1

## 2018-11-27 MED ORDER — CEFAZOLIN SODIUM-DEXTROSE 2-4 GM/100ML-% IV SOLN
2.0000 g | Freq: Four times a day (QID) | INTRAVENOUS | Status: AC
  Administered 2018-11-27: 2 g via INTRAVENOUS
  Filled 2018-11-27 (×2): qty 100

## 2018-11-27 MED ORDER — ROPIVACAINE HCL 5 MG/ML IJ SOLN
INTRAMUSCULAR | Status: DC | PRN
  Administered 2018-11-27: 30 mL via PERINEURAL

## 2018-11-27 MED ORDER — MORPHINE SULFATE (PF) 4 MG/ML IV SOLN
INTRAVENOUS | Status: AC
  Filled 2018-11-27: qty 2

## 2018-11-27 MED ORDER — BUPIVACAINE HCL 0.25 % IJ SOLN
INTRAMUSCULAR | Status: DC | PRN
  Administered 2018-11-27: 20 mL

## 2018-11-27 MED ORDER — OXYCODONE HCL 5 MG PO TABS
5.0000 mg | ORAL_TABLET | ORAL | Status: DC | PRN
  Administered 2018-11-27 – 2018-11-29 (×12): 10 mg via ORAL
  Administered 2018-11-30: 5 mg via ORAL
  Administered 2018-11-30: 10 mg via ORAL
  Filled 2018-11-27 (×14): qty 2

## 2018-11-27 MED ORDER — LACTATED RINGERS IV SOLN
INTRAVENOUS | Status: DC
  Administered 2018-11-27 (×2): via INTRAVENOUS

## 2018-11-27 MED ORDER — PHENYLEPHRINE 40 MCG/ML (10ML) SYRINGE FOR IV PUSH (FOR BLOOD PRESSURE SUPPORT)
PREFILLED_SYRINGE | INTRAVENOUS | Status: AC
  Filled 2018-11-27: qty 10

## 2018-11-27 MED ORDER — ASPIRIN 81 MG PO CHEW
81.0000 mg | CHEWABLE_TABLET | Freq: Every day | ORAL | Status: DC
  Administered 2018-11-28 – 2018-11-30 (×3): 81 mg via ORAL
  Filled 2018-11-27 (×3): qty 1

## 2018-11-27 MED ORDER — BUPIVACAINE LIPOSOME 1.3 % IJ SUSP
20.0000 mL | Freq: Once | INTRAMUSCULAR | Status: DC
  Filled 2018-11-27: qty 20

## 2018-11-27 MED ORDER — TRANEXAMIC ACID-NACL 1000-0.7 MG/100ML-% IV SOLN
1000.0000 mg | INTRAVENOUS | Status: AC
  Administered 2018-11-27: 1000 mg via INTRAVENOUS
  Filled 2018-11-27: qty 100

## 2018-11-27 MED ORDER — HYDROMORPHONE HCL 1 MG/ML IJ SOLN: 0.5000 mg | INTRAMUSCULAR | Status: DC | PRN

## 2018-11-27 MED ORDER — CEFAZOLIN SODIUM-DEXTROSE 2-4 GM/100ML-% IV SOLN
2.0000 g | INTRAVENOUS | Status: AC
  Administered 2018-11-27: 2 g via INTRAVENOUS
  Filled 2018-11-27: qty 100

## 2018-11-27 MED ORDER — MENTHOL 3 MG MT LOZG: 1.0000 | LOZENGE | OROMUCOSAL | Status: DC | PRN

## 2018-11-27 MED ORDER — FENTANYL CITRATE (PF) 100 MCG/2ML IJ SOLN
25.0000 ug | INTRAMUSCULAR | Status: DC | PRN
  Administered 2018-11-27 (×2): 50 ug via INTRAVENOUS

## 2018-11-27 MED ORDER — DEXAMETHASONE SODIUM PHOSPHATE 10 MG/ML IJ SOLN
INTRAMUSCULAR | Status: DC | PRN
  Administered 2018-11-27: 10 mg via INTRAVENOUS

## 2018-11-27 MED ORDER — FENTANYL CITRATE (PF) 100 MCG/2ML IJ SOLN
INTRAMUSCULAR | Status: AC
  Filled 2018-11-27: qty 2

## 2018-11-27 MED ORDER — GLYCOPYRROLATE 0.2 MG/ML IJ SOLN
INTRAMUSCULAR | Status: DC | PRN
  Administered 2018-11-27: 0.2 mg via INTRAVENOUS

## 2018-11-27 MED ORDER — BUPIVACAINE HCL (PF) 0.25 % IJ SOLN
INTRAMUSCULAR | Status: AC
  Filled 2018-11-27: qty 30

## 2018-11-27 MED ORDER — METOCLOPRAMIDE HCL 5 MG/ML IJ SOLN: 5.0000 mg | Freq: Three times a day (TID) | INTRAMUSCULAR | Status: DC | PRN

## 2018-11-27 SURGICAL SUPPLY — 83 items
BAG DECANTER FOR FLEXI CONT (MISCELLANEOUS) ×3 IMPLANT
BANDAGE ESMARK 6X9 LF (GAUZE/BANDAGES/DRESSINGS) ×1 IMPLANT
BEARING TIBIAL SZ4 13MM KNEE (Insert) IMPLANT
BLADE SAG 18X100X1.27 (BLADE) ×3 IMPLANT
BNDG CMPR 9X6 STRL LF SNTH (GAUZE/BANDAGES/DRESSINGS) ×1
BNDG CMPR MED 10X6 ELC LF (GAUZE/BANDAGES/DRESSINGS) ×1
BNDG CMPR MED 15X6 ELC VLCR LF (GAUZE/BANDAGES/DRESSINGS) ×1
BNDG COHESIVE 6X5 TAN STRL LF (GAUZE/BANDAGES/DRESSINGS) ×3 IMPLANT
BNDG ELASTIC 4X5.8 VLCR STR LF (GAUZE/BANDAGES/DRESSINGS) ×2 IMPLANT
BNDG ELASTIC 6X10 VLCR STRL LF (GAUZE/BANDAGES/DRESSINGS) ×2 IMPLANT
BNDG ELASTIC 6X15 VLCR STRL LF (GAUZE/BANDAGES/DRESSINGS) ×3 IMPLANT
BNDG ESMARK 6X9 LF (GAUZE/BANDAGES/DRESSINGS) ×3
BOWL SMART MIX CTS (DISPOSABLE) IMPLANT
BSPLAT TIB 4 KN TRITANIUM (Knees) ×1 IMPLANT
CLOSURE STERI-STRIP 1/2X4 (GAUZE/BANDAGES/DRESSINGS) ×1
CLOSURE WOUND 1/2 X4 (GAUZE/BANDAGES/DRESSINGS) ×2
CLSR STERI-STRIP ANTIMIC 1/2X4 (GAUZE/BANDAGES/DRESSINGS) ×1 IMPLANT
COMPONENT TRI CR RETAIN KNEE (Orthopedic Implant) IMPLANT
CONT SPEC 4OZ CLIKSEAL STRL BL (MISCELLANEOUS) ×3 IMPLANT
COVER SURGICAL LIGHT HANDLE (MISCELLANEOUS) ×3 IMPLANT
CUFF TOURN SGL QUICK 34 (TOURNIQUET CUFF) ×3
CUFF TRNQT CYL 34X4.125X (TOURNIQUET CUFF) ×1 IMPLANT
DECANTER SPIKE VIAL GLASS SM (MISCELLANEOUS) ×3 IMPLANT
DRAPE ORTHO SPLIT 77X108 STRL (DRAPES) ×9
DRAPE SURG ORHT 6 SPLT 77X108 (DRAPES) ×3 IMPLANT
DRAPE U-SHAPE 47X51 STRL (DRAPES) ×3 IMPLANT
DRSG AQUACEL AG ADV 3.5X10 (GAUZE/BANDAGES/DRESSINGS) ×2 IMPLANT
DRSG AQUACEL AG ADV 3.5X14 (GAUZE/BANDAGES/DRESSINGS) IMPLANT
DURAPREP 26ML APPLICATOR (WOUND CARE) ×6 IMPLANT
ELECT CAUTERY BLADE 6.4 (BLADE) ×3 IMPLANT
ELECT REM PT RETURN 9FT ADLT (ELECTROSURGICAL) ×3
ELECTRODE REM PT RTRN 9FT ADLT (ELECTROSURGICAL) ×1 IMPLANT
GAUZE SPONGE 4X4 12PLY STRL (GAUZE/BANDAGES/DRESSINGS) ×3 IMPLANT
GLOVE BIOGEL PI IND STRL 6.5 (GLOVE) IMPLANT
GLOVE BIOGEL PI IND STRL 7.0 (GLOVE) ×1 IMPLANT
GLOVE BIOGEL PI IND STRL 8 (GLOVE) ×1 IMPLANT
GLOVE BIOGEL PI IND STRL 8.5 (GLOVE) IMPLANT
GLOVE BIOGEL PI INDICATOR 6.5 (GLOVE) ×2
GLOVE BIOGEL PI INDICATOR 7.0 (GLOVE) ×8
GLOVE BIOGEL PI INDICATOR 8 (GLOVE) ×2
GLOVE BIOGEL PI INDICATOR 8.5 (GLOVE) ×2
GLOVE ECLIPSE 7.0 STRL STRAW (GLOVE) ×3 IMPLANT
GLOVE ECLIPSE 8.0 STRL XLNG CF (GLOVE) ×5 IMPLANT
GOWN STRL REUS W/ TWL LRG LVL3 (GOWN DISPOSABLE) ×3 IMPLANT
GOWN STRL REUS W/TWL LRG LVL3 (GOWN DISPOSABLE) ×9
HANDPIECE INTERPULSE COAX TIP (DISPOSABLE) ×3
HOOD PEEL AWAY FLYTE STAYCOOL (MISCELLANEOUS) ×11 IMPLANT
IMMOBILIZER KNEE 22 UNIV (SOFTGOODS) ×2 IMPLANT
KIT BASIN OR (CUSTOM PROCEDURE TRAY) ×3 IMPLANT
KIT TURNOVER KIT B (KITS) ×3 IMPLANT
KNEE PATELLA ASYMMETRIC 10X35 (Knees) ×2 IMPLANT
KNEE TIBIAL COMP TRI SZ4 (Knees) ×2 IMPLANT
MANIFOLD NEPTUNE II (INSTRUMENTS) ×3 IMPLANT
NDL SPNL 18GX3.5 QUINCKE PK (NEEDLE) ×1 IMPLANT
NEEDLE 22X1 1/2 (OR ONLY) (NEEDLE) ×6 IMPLANT
NEEDLE SPNL 18GX3.5 QUINCKE PK (NEEDLE) ×3 IMPLANT
NS IRRIG 1000ML POUR BTL (IV SOLUTION) ×10 IMPLANT
PACK TOTAL JOINT (CUSTOM PROCEDURE TRAY) ×3 IMPLANT
PAD ARMBOARD 7.5X6 YLW CONV (MISCELLANEOUS) ×6 IMPLANT
PAD CAST 4YDX4 CTTN HI CHSV (CAST SUPPLIES) ×1 IMPLANT
PADDING CAST COTTON 4X4 STRL (CAST SUPPLIES) ×3
PADDING CAST COTTON 6X4 STRL (CAST SUPPLIES) ×3 IMPLANT
PIN FLUTED HEDLESS FIX 3.5X1/8 (PIN) ×2 IMPLANT
SET HNDPC FAN SPRY TIP SCT (DISPOSABLE) ×1 IMPLANT
SPONGE LAP 18X18 X RAY DECT (DISPOSABLE) ×2 IMPLANT
STRIP CLOSURE SKIN 1/2X4 (GAUZE/BANDAGES/DRESSINGS) ×4 IMPLANT
SUCTION FRAZIER HANDLE 10FR (MISCELLANEOUS) ×2
SUCTION TUBE FRAZIER 10FR DISP (MISCELLANEOUS) ×1 IMPLANT
SUT MNCRL AB 3-0 PS2 18 (SUTURE) ×3 IMPLANT
SUT VIC AB 0 CT1 27 (SUTURE) ×9
SUT VIC AB 0 CT1 27XBRD ANBCTR (SUTURE) ×3 IMPLANT
SUT VIC AB 1 CT1 27 (SUTURE) ×9
SUT VIC AB 1 CT1 27XBRD ANBCTR (SUTURE) ×5 IMPLANT
SUT VIC AB 2-0 CT1 27 (SUTURE) ×6
SUT VIC AB 2-0 CT1 TAPERPNT 27 (SUTURE) ×4 IMPLANT
SYR 30ML LL (SYRINGE) ×9 IMPLANT
SYR TB 1ML LUER SLIP (SYRINGE) ×3 IMPLANT
TIBIAL BEARING SZ4 13MM KNEE (Insert) ×3 IMPLANT
TOWEL GREEN STERILE (TOWEL DISPOSABLE) ×6 IMPLANT
TOWEL GREEN STERILE FF (TOWEL DISPOSABLE) ×6 IMPLANT
TRAY CATH 16FR W/PLASTIC CATH (SET/KITS/TRAYS/PACK) ×2 IMPLANT
TRIA CRUCIATE RETAIN KNEE (Orthopedic Implant) ×3 IMPLANT
WATER STERILE IRR 1000ML POUR (IV SOLUTION) IMPLANT

## 2018-11-27 NOTE — Anesthesia Procedure Notes (Signed)
Spinal  Patient location during procedure: OR Start time: 11/27/2018 7:20 AM End time: 11/27/2018 7:30 AM Staffing Anesthesiologist: Murvin Natal, MD Performed: anesthesiologist  Preanesthetic Checklist Completed: patient identified, surgical consent, pre-op evaluation, timeout performed, IV checked, risks and benefits discussed and monitors and equipment checked Spinal Block Patient position: sitting Prep: DuraPrep Patient monitoring: cardiac monitor, continuous pulse ox and blood pressure Approach: midline Location: L4-5 Injection technique: single-shot Needle Needle type: Pencan  Needle gauge: 24 G Needle length: 9 cm Assessment Sensory level: T10 Additional Notes Functioning IV was confirmed and monitors were applied. Sterile prep and drape, including hand hygiene and sterile gloves were used. The patient was positioned and the spine was prepped. The skin was anesthetized with lidocaine.  Free flow of clear CSF was obtained prior to injecting local anesthetic into the CSF.  The spinal needle aspirated freely following injection.  The needle was carefully withdrawn.  The patient tolerated the procedure well.

## 2018-11-27 NOTE — Care Plan (Signed)
Ortho Bundle Case Management Note  Patient Details  Name: [name] MRN: [id number] Date of Birth: 04/17/1950   Signed        RNCM call to patient for Ortho bundle pre-op call. Reviewed ortho bundle program and patient was able to ask appropriate questions and review post-op care related to her scheduled Right TKA on 11/27/18 per Dr. Marlou Sa. Patient verbalized that her home CPM has been delivered today by [employer]. Her husband will be able to assist her at home after short stay in the hospital. She has a RW and elevated toilet seat. No DME needs. She has also requested home health PT be set up with [facility] as she has used them in the past. Referral made to liaison. Will be scheduling OPPT in office at [facility] F/U with Dr. Marlou Sa scheduled for 12/12/18. Will continue to monitor for all CM needs.                       DME Arranged:  (No DME needs; patient has FWW and elevated toilet seat; CPM delivered by [employer] to home) DME Agency:  [employer](For home CPM- delivered 11/26/18 to home)  HH Arranged:  PT HH Agency:  [facility] ([place])  Additional Comments: Please contact me with any questions of if this plan should need to change.  Jamse Arn, RN, BSN, [employer]  [phone] 11/27/2018, 10:02 AM

## 2018-11-27 NOTE — H&P (Addendum)
TOTAL KNEE ADMISSION H&P  Patient is being admitted for right total knee arthroplasty.  Subjective:  Chief Complaint:right knee pain.  HPI: [name], 68 y.o. female, has a history of pain and functional disability in the right knee due to arthritis and has failed non-surgical conservative treatments for greater than 12 weeks to includeNSAID's and/or analgesics, corticosteriod injections, viscosupplementation injections, flexibility and strengthening excercises, use of assistive devices and activity modification.  Onset of symptoms was gradual, starting >10 years ago with gradually worsening course since that time. The patient noted prior procedures on the knee to include  arthroscopy and menisectomy on the right knee(s).  Patient currently rates pain in the right knee(s) at 8 out of 10 with activity. Patient has night pain, worsening of pain with activity and weight bearing, pain that interferes with activities of daily living, pain with passive range of motion, crepitus and joint swelling.  Patient has evidence of subchondral sclerosis and joint space narrowing by imaging studies. This patient has had Recent spinal surgery and has done well with that.  She wants to have diminished knee pain so she can continue rehab on the back with walking. There is no active infection.  Patient Active Problem List   Diagnosis Date Noted  . Unilateral primary osteoarthritis, right knee 07/05/2017    Class: Chronic  . Anemia due to blood loss 07/05/2017    Class: Acute  . Spinal stenosis, lumbar region with neurogenic claudication   . Spondylolisthesis, lumbar region   . S/P lumbar spinal fusion 07/03/2017  . Mixed conductive and sensorineural hearing loss of left ear with restricted hearing of right ear 12/12/2016  . Acute laryngitis 03/10/2016  . Laryngopharyngeal reflux (LPR) 03/10/2016  . Tympanic membrane perforation, right 03/10/2016   Past Medical History:  Diagnosis Date  . Arthritis   .  Carpal tunnel syndrome   . GERD (gastroesophageal reflux disease)   . Hard of hearing    left side  . Hypothyroid   . Pneumonia   . Varicose vein of leg     Past Surgical History:  Procedure Laterality Date  . ABDOMINAL HYSTERECTOMY    . ABDOMINAL HYSTERECTOMY    . APPENDECTOMY    . BACK SURGERY    . CARPAL TUNNEL RELEASE Left 06/26/2018   Procedure: CARPAL TUNNEL RELEASE;  Surgeon: Daryll Brod, MD;  Location: [facility];  Service: Orthopedics;  Laterality: Left;  . cholesteatoma Left   . COLON SURGERY     part of intestine removed  . FOOT SURGERY Bilateral   . IMPLANTATION BONE ANCHORED HEARING AID Left 01/2017  . INJECTION KNEE Right 07/03/2017   Procedure: KNEE INJECTION;  Surgeon: Jessy Oto, MD;  Location: [facility];  Service: Orthopedics;  Laterality: Right;  . KNEE ARTHROSCOPY Right   . SPINAL FUSION     L4 and L5 for spinal stenosis , 06/2017  . TONSILLECTOMY      Current Facility-Administered Medications  Medication Dose Route Frequency Provider Last Rate Last Dose  . bupivacaine liposome (EXPAREL) 1.3 % injection 266 mg  20 mL Infiltration Once Meredith Pel, MD      . ceFAZolin (ANCEF) IVPB 2g/100 mL premix  2 g Intravenous On Call to OR Magnant, Charles L, PA-C      . chlorhexidine (HIBICLENS) 4 % liquid 4 application  60 mL Topical Once Magnant, Charles L, PA-C      . chlorhexidine (HIBICLENS) 4 % liquid 4 application  60 mL Topical Once Magnant,  Charles L, PA-C      . povidone-iodine 10 % swab 2 application  2 application Topical Once Magnant, Charles L, PA-C      . tranexamic acid (CYKLOKAPRON) 2,000 mg in sodium chloride 0.9 % 50 mL Topical Application  123XX123 mg Topical Once Meredith Pel, MD      . tranexamic acid (CYKLOKAPRON) IVPB 1,000 mg  1,000 mg Intravenous To OR Marlou Sa Tonna Corner, MD       No Known Allergies  Social History   Tobacco Use  . Smoking status: Never Smoker  . Smokeless tobacco: Never Used  Substance Use Topics   . Alcohol use: Yes    Alcohol/week: 1.0 standard drinks    Types: 1 Glasses of wine per week    Comment:  drinks wine daily with dinner    History reviewed. No pertinent family history.   Review of Systems  Musculoskeletal: Positive for joint pain.  All other systems reviewed and are negative.   Objective:  Physical Exam  Constitutional: She appears well-developed.  HENT:  Head: Normocephalic.  Eyes: Pupils are equal, round, and reactive to light.  Neck: Normal range of motion.  Cardiovascular: Normal rate.  Respiratory: Effort normal.  Neurological: She is alert.  Skin: Skin is warm.  Psychiatric: She has a normal mood and affect.  Right knee demonstrates intact extensor mechanism and intact skin.  Pedal pulses palpable.  Patient has medial greater than lateral joint line tenderness.  Lacks about 5 degrees to 10 degrees of full extension.  Does have flexion past 90.  Mild patellofemoral crepitus is present.  No groin pain with internal/external rotation of the leg  Vital signs in last 24 hours: Temp:  [98.8 F (37.1 C)] 98.8 F (37.1 C) (11/10 0622) Pulse Rate:  [76] 76 (11/10 0622) Resp:  [18] 18 (11/10 0622) BP: (131)/(67) 131/67 (11/10 0622) SpO2:  [97 %] 97 % (11/10 0622) Weight:  [78.9 kg] 78.9 kg (11/10 0622)  Labs:   Estimated body mass index is 31.83 kg/m as calculated from the following:   Height as of this encounter: 5\' 2"  (1.575 m).   Weight as of this encounter: 78.9 kg.   Imaging Review Plain radiographs demonstrate moderate degenerative joint disease of the right knee(s). The overall alignment ismild varus. The bone quality appears to be good for age and reported activity level.      Assessment/Plan:  End stage arthritis, right knee   The patient history, physical examination, clinical judgment of the provider and imaging studies are consistent with end stage degenerative joint disease of the right knee(s) and total knee arthroplasty is deemed  medically necessary. The treatment options including medical management, injection therapy arthroscopy and arthroplasty were discussed at length. The risks and benefits of total knee arthroplasty were presented and reviewed. The risks due to aseptic loosening, infection, stiffness, patella tracking problems, thromboembolic complications and other imponderables were discussed. The patient acknowledged the explanation, agreed to proceed with the plan and consent was signed. Patient is being admitted for inpatient treatment for surgery, pain control, PT, OT, prophylactic antibiotics, VTE prophylaxis, progressive ambulation and ADL's and discharge planning. The patient is planning to be discharged home with home health services    Anticipated LOS equal to or greater than 2 midnights due to - Age 47 and older with one or more of the following:  - Obesity  - Expected need for hospital services (PT, OT, Nursing) required for safe  discharge  - Anticipated need for postoperative  skilled nursing care or inpatient rehab  - Active co-morbidities: None OR   - Unanticipated findings during/Post Surgery: None  - Patient is a high risk of re-admission due to: None

## 2018-11-27 NOTE — Transfer of Care (Signed)
Immediate Anesthesia Transfer of Care Note  Patient: [name]  Procedure(s) Performed: RIGHT TOTAL KNEE ARTHROPLASTY (Right Knee)  Patient Location: PACU  Anesthesia Type:General  Level of Consciousness: awake, alert  and oriented  Airway & Oxygen Therapy: Patient connected to face mask oxygen  Post-op Assessment: Post -op Vital signs reviewed and stable  Post vital signs: stable  Last Vitals:  Vitals Value Taken Time  BP    Temp    Pulse 89 11/27/18 1023  Resp 17 11/27/18 1024  SpO2 100 % 11/27/18 1023  Vitals shown include unvalidated device data.  Last Pain:  Vitals:   11/27/18 0642  TempSrc:   PainSc: 0-No pain      Patients Stated Pain Goal: 6 ([id number])  Complications: No apparent anesthesia complications

## 2018-11-27 NOTE — Anesthesia Procedure Notes (Signed)
Procedure Name: LMA Insertion Date/Time: 11/27/2018 8:00 AM Performed by: Lavell Luster, CRNA Pre-anesthesia Checklist: Patient identified, Emergency Drugs available, Suction available and Patient being monitored Patient Re-evaluated:Patient Re-evaluated prior to induction Oxygen Delivery Method: Circle System Utilized Preoxygenation: Pre-oxygenation with 100% oxygen Induction Type: IV induction Ventilation: Mask ventilation without difficulty LMA: LMA inserted LMA Size: 4.0 Number of attempts: 1 Placement Confirmation: positive ETCO2 Tube secured with: Tape Dental Injury: Teeth and Oropharynx as per pre-operative assessment

## 2018-11-27 NOTE — Brief Op Note (Signed)
   11/27/2018  10:31 AM  PATIENT:  Rosalyn Charters Trethewey  68 y.o. female  PRE-OPERATIVE DIAGNOSIS:  right knee osteoarthritis  POST-OPERATIVE DIAGNOSIS:  right knee osteoarthritis  PROCEDURE:  Procedure(s): RIGHT TOTAL KNEE ARTHROPLASTY  SURGEON:  Surgeon(s): Meredith Pel, MD  ASSISTANT: magnant pa  ANESTHESIA:   general  EBL: 75 ml    Total I/O In: 1000 [I.V.:1000] Out: 50 [Blood:50]  BLOOD ADMINISTERED: none  DRAINS: none   LOCAL MEDICATIONS USED:  exparel  SPECIMEN:  No Specimen  COUNTS:  YES  TOURNIQUET:   Total Tourniquet Time Documented: Thigh (Right) - 1 minutes Thigh (Right) - 82 minutes Total: Thigh (Right) - 83 minutes   DICTATION: .Other Dictation: Dictation Number [id number]  PLAN OF CARE: Admit to inpatient   PATIENT DISPOSITION:  PACU - hemodynamically stable

## 2018-11-27 NOTE — Progress Notes (Signed)
Orthopedic Tech Progress Note Patient Details:  [name] 09-02-1950 [id number]  CPM Right Knee CPM Right Knee: On Right Knee Flexion (Degrees): 40 Right Knee Extension (Degrees): 10 Additional Comments: foot roll  Post Interventions Patient Tolerated: Well Instructions Provided: Care of device  [name] 11/27/2018, 11:12 AM

## 2018-11-27 NOTE — Anesthesia Postprocedure Evaluation (Signed)
Anesthesia Post Note  Patient: [name]  Procedure(s) Performed: RIGHT TOTAL KNEE ARTHROPLASTY (Right Knee)     Patient location during evaluation: PACU Anesthesia Type: Regional and General Level of consciousness: awake and alert Pain management: pain level controlled Vital Signs Assessment: post-procedure vital signs reviewed and stable Respiratory status: spontaneous breathing, nonlabored ventilation, respiratory function stable and patient connected to nasal cannula oxygen Cardiovascular status: blood pressure returned to baseline and stable Postop Assessment: no apparent nausea or vomiting Anesthetic complications: no    Last Vitals:  Vitals:   11/27/18 1125 11/27/18 1154  BP: 101/69 106/73  Pulse: 66 70  Resp: 14 17  Temp: 36.4 C   SpO2: 100% 100%    Last Pain:  Vitals:   11/27/18 1500  TempSrc:   PainSc: Asleep                 Ryan P Ellender

## 2018-11-27 NOTE — Op Note (Signed)
NAME: Denise, Barnes MEDICAL RECORD [id number] ACCOUNT [id number] DATE OF BIRTH:September 19, 1950 FACILITY: [facility] LOCATION: [facility] PHYSICIAN:GREGORY Randel Pigg, MD  OPERATIVE REPORT  DATE OF PROCEDURE:  11/27/2018  PREOPERATIVE DIAGNOSIS:  Right knee arthritis.  POSTOPERATIVE DIAGNOSIS:  Right knee arthritis.  PROCEDURE:  Right total knee replacement.  SURGEON:  Meredith Pel, MD  ASSISTANT:  Annie Main, PA.  INDICATIONS:  The patient is a 68 year old patient with end-stage right knee arthritis who presents for operative management after explanation of risks and benefits.  PROCEDURE IN DETAIL:  The patient was brought to the operating room where spinal anesthetic was attempted to be induced, but this was not successful.  General anesthetic was then induced.  Right leg was prescrubbed with alcohol and Betadine, allowed to  air dry.  Prep with DuraPrep solution and draped in a sterile manner.  Ioban used to cover the operative field.  Leg was elevated, exsanguinated with the Esmarch wrap.  Tourniquet was inflated.  Timeout was called.  Anterior approach to knee was made.   Skin and subcutaneous tissue were sharply divided.  Median parapatellar approach was made and marked with #1 Vicryl suture.  Patella was everted.  The patient had severe end-stage arthritis in all compartments, worse in the medial compartment.  At this  time, the fat pad was partially excised.  Osteophytes were removed.  Soft tissue from the anterior distal femur was removed.  Lateral patellofemoral ligament was removed.  The intramedullary alignment rod was then used to make a cut perpendicular to the  mechanical axis of the tibia with the collaterals and posterior structures protected.  At this time, the cut was made 9 mm off the least affected lateral tibial plateau.  The cut was made and there was very significant medial wear.  The cut did create a  flat surface of about 95% of that lateral tibial plateau with  only about 5% a sloping posteromedially.  Attention was then directed towards the femur.  An 8 mm distal cut was made off of the distal femur in 5 degrees of valgus.  Anterior, posterior and  chamfer cuts were then made.  Femur was size 3 tibia size 4.  Trial components placed.  The patella was then cut down from 22-12 and a 35.3 peg patella trial was placed.  At this time, with a trial 11 and 13 spacer the patient had the best stability with  a 13 spacer.  Even though PCL sparing was performed, the patient did have some significant laxity to the PCL.  Deep dish implant was utilized.  The patient with those trial implants including a 13 spacer had about 1-2 degrees of hyperextension with  excellent flexion and no liftoff, excellent patellar tracking and good stability to varus and valgus stress at 0, 30 and 90 degrees.  Final preparation of the femur was performed.  The tibia was keel punched and prepared at this time.  Thorough  irrigation with 3 liters of irrigating solution was utilized and then tranexamic acid sponge and IrriSept solution, which was utilized throughout the entire case beginning from the incision as well as with the arthrotomy was utilized again for 3 full  minutes while the TXA sponge was allowed to work.  At this time, the sponges were removed from the knee joint and the components were tapped into position with excellent press-fit obtained.  Excellent patellar tracking and good stability also was  maintained.  At this time, tourniquet was released.  Bleeding points encountered controlled using  electrocautery.  Pouring irrigation was then utilized x2 liters.  The IrriSept solution also utilized and the arthrotomy was closed over a bolster using #1  Vicryl suture followed by interrupted inverted 0 Vicryl suture, 2-0 Vicryl suture and a 3-0 Monocryl.  Steri-Strips and Aquacel dressing applied.  The patient tolerated the procedure well without immediate complications.  A bulky dressing, Ace  wrap and  knee immobilizer applied.  Transferred to the recovery room in stable condition.  Luke's assistance was also required at all times during the case for retraction, mobilization of tissues, opening and closing.  His assistance was medical necessity.   Components utilized for this case Include Stryker size 3 press-fit femur, a 13 deep dish polyethylene implant with size 4 tibial tray also press-fit and 35 mm press-fit patella 3 PEG.  TN/NUANCE  D:11/27/2018 T:11/27/2018 JOB:[id number]

## 2018-11-27 NOTE — Anesthesia Procedure Notes (Signed)
Anesthesia Regional Block: Adductor canal block   Pre-Anesthetic Checklist: ,, timeout performed, Correct Patient, Correct Site, Correct Laterality, Correct Procedure,, site marked, risks and benefits discussed, Surgical consent,  Pre-op evaluation,  At surgeon's request and post-op pain management  Laterality: Right  Prep: chloraprep       Needles:  Injection technique: Single-shot  Needle Type: Echogenic Stimulator Needle     Needle Length: 9cm  Needle Gauge: 21     Additional Needles:   Procedures:,,,, ultrasound used (permanent image in chart),,,,  Narrative:  Start time: 11/27/2018 6:55 AM End time: 11/27/2018 7:05 AM Injection made incrementally with aspirations every 5 mL.  Performed by: Personally  Anesthesiologist: Murvin Natal, MD  Additional Notes: Functioning IV was confirmed and monitors were applied. A time-out was performed. Hand hygiene and sterile gloves were used. The thigh was placed in a frog-leg position and prepped in a sterile fashion. A 64mm 21ga Arrow echogenic stimulator needle was placed using ultrasound guidance.  Negative aspiration and negative test dose prior to incremental administration of local anesthetic. The patient tolerated the procedure well.

## 2018-11-27 NOTE — Evaluation (Signed)
Physical Therapy Evaluation Patient Details Name: [name] MRN: [id number] DOB: 1950/07/07 Today's Date: 11/27/2018   History of Present Illness  Patient is a 68 y/o female with h/o back surgery and hearing loss now s/p R TKA.  Clinical Impression  Patient presents with mobility limited by R knee pain, decreased strength and ROM and today with likely hypotensive episode upon rising with light headedness and nausea so returned to supine.  Feel she will benefit from skilled PT in the acute setting to allow return home with family support and follow up PT as noted below.     Follow Up Recommendations Follow surgeon's recommendation for DC plan and follow-up therapies    Equipment Recommendations  None recommended by PT    Recommendations for Other Services       Precautions / Restrictions Precautions Precautions: Fall;Knee Precaution Comments: watch BP Required Braces or Orthoses: Knee Immobilizer - Right Restrictions Other Position/Activity Restrictions: WBAT      Mobility  Bed Mobility Overal bed mobility: Needs Assistance Bed Mobility: Supine to Sit;Sit to Supine     Supine to sit: Min assist Sit to supine: Min assist   General bed mobility comments: min A for R LE  Transfers Overall transfer level: Needs assistance Equipment used: Rolling walker (2 wheeled) Transfers: Sit to/from [facility] Sit to Stand: Min assist Stand pivot transfers: Min assist       General transfer comment: took steps with RW in attempt to pivot to [facility], but felt too light headed so sat down and return to supine to use bed pan  Ambulation/Gait                Stairs            Wheelchair Mobility    Modified Rankin (Stroke Patients Only)       Balance Overall balance assessment: Needs assistance   Sitting balance-Leahy Scale: Good     Standing balance support: Bilateral upper extremity supported Standing balance-Leahy Scale: Poor                                Pertinent Vitals/Pain Pain Assessment: Faces Faces Pain Scale: Hurts little more Pain Location: R knee pain Pain Descriptors / Indicators: Aching;Sore Pain Intervention(s): Monitored during session;Repositioned;Limited activity within patient's tolerance    Home Living Family/patient expects to be discharged to:: Private residence Living Arrangements: Spouse/significant other Available Help at Discharge: Family Type of Home: House Home Access: Stairs to enter   Technical brewer of Steps: 1 Home Layout: Able to live on main level with bedroom/bathroom;Two level Home Equipment: Walker - 2 wheels      Prior Function Level of Independence: Independent               Hand Dominance        Extremity/Trunk Assessment   Upper Extremity Assessment Upper Extremity Assessment: Overall WFL for tasks assessed    Lower Extremity Assessment Lower Extremity Assessment: RLE deficits/detail RLE Deficits / Details: AROM knee grossly 40 degrees, strength at least 3-/5 knee extension RLE Sensation: WNL       Communication   Communication: No difficulties  Cognition Arousal/Alertness: Awake/alert Behavior During Therapy: WFL for tasks assessed/performed Overall Cognitive Status: Within Functional Limits for tasks assessed  General Comments General comments (skin integrity, edema, etc.): spouse in the room and supportive    Exercises Total Joint Exercises Ankle Circles/Pumps: AROM;10 reps;Supine;Both Quad Sets: AROM;Right;5 reps;Supine Short Arc Quad: AROM;Right;5 reps;Supine;AAROM Heel Slides: AROM;AAROM;5 reps;Right;Supine   Assessment/Plan    PT Assessment Patient needs continued PT services  PT Problem List Decreased strength;Decreased mobility;Decreased range of motion;Decreased knowledge of use of DME;Pain       PT Treatment Interventions DME instruction;Therapeutic  activities;Balance training;Therapeutic exercise;Functional mobility training;Gait training;Patient/family education    PT Goals (Current goals can be found in the Care Plan section)  Acute Rehab PT Goals Patient Stated Goal: to return home PT Goal Formulation: With patient Time For Goal Achievement: 12/04/18 Potential to Achieve Goals: Good    Frequency 7X/week   Barriers to discharge        Co-evaluation               AM-PAC PT "6 Clicks" Mobility  Outcome Measure Help needed turning from your back to your side while in a flat bed without using bedrails?: A Little Help needed moving from lying on your back to sitting on the side of a flat bed without using bedrails?: A Little Help needed moving to and from a bed to a chair (including a wheelchair)?: A Little Help needed standing up from a chair using your arms (e.g., wheelchair or bedside chair)?: A Little Help needed to walk in hospital room?: A Lot Help needed climbing 3-5 steps with a railing? : A Lot 6 Click Score: 16    End of Session   Activity Tolerance: Patient limited by fatigue;Treatment limited secondary to medical complications (Comment)(light headed, likely hypotensive) Patient left: in bed;with call bell/phone within reach;with bed alarm set;with family/visitor present   PT Visit Diagnosis: Difficulty in walking, not elsewhere classified (R26.2)    Time: [id number] PT Time Calculation (min) (ACUTE ONLY): 25 min   Charges:   PT Evaluation $PT Eval Low Complexity: 1 Low PT Treatments $Therapeutic Activity: 8-22 mins        Magda Kiel, [place] [facility] [phone] 11/27/2018   Reginia Naas 11/27/2018, 5:38 PM

## 2018-11-28 ENCOUNTER — Encounter (HOSPITAL_COMMUNITY): Payer: Self-pay | Admitting: General Practice

## 2018-11-28 MED ORDER — GABAPENTIN 100 MG PO CAPS
100.0000 mg | ORAL_CAPSULE | Freq: Three times a day (TID) | ORAL | Status: DC
  Administered 2018-11-28 – 2018-11-30 (×5): 100 mg via ORAL
  Filled 2018-11-28 (×5): qty 1

## 2018-11-28 MED ORDER — ALUM & MAG HYDROXIDE-SIMETH 200-200-20 MG/5ML PO SUSP
30.0000 mL | Freq: Four times a day (QID) | ORAL | Status: DC | PRN
  Administered 2018-11-28: 30 mL via ORAL
  Filled 2018-11-28: qty 30

## 2018-11-28 NOTE — Progress Notes (Signed)
Physical Therapy Treatment Patient Details Name: [name] MRN: [id number] DOB: Feb 17, 1950 Today's Date: 11/28/2018    History of Present Illness Patient is a 68 y/o female with h/o back surgery and hearing loss now s/p R TKA.    PT Comments    Patient received in bed, requesting additional pain medicine. Once received, patient agreeable to PT. Requires min guard assist with bed mobility for R LE only. Sat edge of bed x 5 min then performed transfer sit to stand. Good balance and weight distribution with standing and RW. Min guard. Patient became increasingly faint feeling and returned to supine. Patient will continue to benefit from skilled PT while here to improve functional independence, strength and safety. Much improved ability from this morning session. Anticipate she will be able to initiate gait training tomorrow.        Follow Up Recommendations  Follow surgeon's recommendation for DC plan and follow-up therapies;Home health PT;Supervision/Assistance - 24 hour     Equipment Recommendations  None recommended by PT    Recommendations for Other Services       Precautions / Restrictions Precautions Precautions: Fall;Knee Required Braces or Orthoses: Knee Immobilizer - Right Restrictions Weight Bearing Restrictions: Yes RLE Weight Bearing: Weight bearing as tolerated    Mobility  Bed Mobility Overal bed mobility: Needs Assistance Bed Mobility: Supine to Sit;Sit to Supine     Supine to sit: Min guard Sit to supine: Min guard   General bed mobility comments: min A for R LE, just for support. She is able to move it in bed.  Transfers Overall transfer level: Needs assistance Equipment used: Rolling walker (2 wheeled) Transfers: Sit to/from Stand Sit to Stand: Min guard            Ambulation/Gait             General Gait Details: dizziness, nausea with standing, unable to ambulate   Stairs             Wheelchair Mobility    Modified  Rankin (Stroke Patients Only)       Balance Overall balance assessment: Needs assistance Sitting-balance support: Feet supported;Bilateral upper extremity supported Sitting balance-Leahy Scale: Good     Standing balance support: During functional activity;Bilateral upper extremity supported Standing balance-Leahy Scale: Fair                              Cognition Arousal/Alertness: Awake/alert Behavior During Therapy: WFL for tasks assessed/performed Overall Cognitive Status: Within Functional Limits for tasks assessed                                        Exercises Total Joint Exercises Ankle Circles/Pumps: AROM;20 reps;Both Quad Sets: AROM;10 reps;Right;Supine Heel Slides: AAROM;10 reps;Right;Supine Hip ABduction/ADduction: AAROM;Right;10 reps;Supine Straight Leg Raises: AAROM;Right;10 reps;Supine    General Comments        Pertinent Vitals/Pain Pain Assessment: 0-10 Pain Score: 3  Pain Location: R knee pain Pain Descriptors / Indicators: Burning;Guarding;Grimacing;Discomfort;Operative site guarding;Sore Pain Intervention(s): Limited activity within patient's tolerance;Monitored during session;Repositioned    Home Living                      Prior Function            PT Goals (current goals can now be found in the care plan section) Acute Rehab  PT Goals Patient Stated Goal: to return home, but not today PT Goal Formulation: With patient Time For Goal Achievement: 12/04/18 Potential to Achieve Goals: Fair Progress towards PT goals: Progressing toward goals    Frequency    7X/week      PT Plan Current plan remains appropriate    Co-evaluation              AM-PAC PT "6 Clicks" Mobility   Outcome Measure  Help needed turning from your back to your side while in a flat bed without using bedrails?: A Little Help needed moving from lying on your back to sitting on the side of a flat bed without using  bedrails?: A Little Help needed moving to and from a bed to a chair (including a wheelchair)?: Total Help needed standing up from a chair using your arms (e.g., wheelchair or bedside chair)?: A Little Help needed to walk in hospital room?: Total Help needed climbing 3-5 steps with a railing? : Total 6 Click Score: 12    End of Session Equipment Utilized During Treatment: Gait belt Activity Tolerance: Other (comment)(limited by nausea/faint with mobility) Patient left: in bed;with call bell/phone within reach;with family/visitor present Nurse Communication: Mobility status PT Visit Diagnosis: Muscle weakness (generalized) (M62.81);Difficulty in walking, not elsewhere classified (R26.2);Pain Pain - Right/Left: Right Pain - part of body: Knee     Time: [id number] PT Time Calculation (min) (ACUTE ONLY): 33 min  Charges:  $Therapeutic Exercise: 8-22 mins $Therapeutic Activity: 8-22 mins                     Henreitta Spittler, PT, GCS 11/28/18,3:51 PM

## 2018-11-28 NOTE — Progress Notes (Signed)
CPM on right knee for 2 hours. Flexion: 40 Extension: 10  Patient tolerated well.

## 2018-11-28 NOTE — Plan of Care (Signed)

## 2018-11-28 NOTE — Progress Notes (Signed)
Physical Therapy Treatment Patient Details Name: [name] MRN: [id number] DOB: [birth year] Today's Date: 11/28/2018    History of Present Illness Patient is a 68 y/o female with h/o back surgery and hearing loss now s/p R TKA.    PT Comments    Patient received in bed, reports severe pain. Feeling nauseated. Patient fearful of moving due to expected pain. Assisted patient from supine to edge of bed and patient unable to tolerate and returned herself to supine. Crying in pain. After calming techniques utilized and re-adjustment of KI, patient agrees to attempt again. Patient requires min guard for R LE bringing it off bed. Once seated on side of bed patient became very lightheaded reporting she was going to pass out. Patient assisted back to supine. She is unable to tolerate mobility due to pain and nasuea, faintness with sitting up. Patient will continue to benefit from skilled PT to improve activity tolerance and improve mobility for eventual return home.         Follow Up Recommendations  Follow surgeon's recommendation for DC plan and follow-up therapies     Equipment Recommendations  None recommended by PT    Recommendations for Other Services       Precautions / Restrictions Precautions Precautions: Fall;Knee Precaution Comments: watch BP Required Braces or Orthoses: Knee Immobilizer - Right Restrictions Weight Bearing Restrictions: Yes RLE Weight Bearing: Weight bearing as tolerated    Mobility  Bed Mobility Overal bed mobility: Needs Assistance Bed Mobility: Supine to Sit;Sit to Supine     Supine to sit: Min assist Sit to supine: Min assist   General bed mobility comments: min A for R LE, just for support. She is able to move it in bed.  Transfers                 General transfer comment: unable. Patient became very lightheaded once seated at edge of bed. Basically passed out for brief period, therefore returned her to supine.  Ambulation/Gait              General Gait Details: unable due to pain and lightheaded with sitting up   Stairs             Wheelchair Mobility    Modified Rankin (Stroke Patients Only)       Balance Overall balance assessment: Needs assistance Sitting-balance support: Bilateral upper extremity supported;Feet supported Sitting balance-Leahy Scale: Good                                      Cognition Arousal/Alertness: Awake/alert Behavior During Therapy: WFL for tasks assessed/performed;Anxious Overall Cognitive Status: Within Functional Limits for tasks assessed                                        Exercises Total Joint Exercises Ankle Circles/Pumps: AROM;10 reps;Both;Supine Hip ABduction/ADduction: AAROM;Right;Supine;5 reps    General Comments        Pertinent Vitals/Pain Pain Assessment: 0-10 Pain Score: 10-Worst pain ever Pain Location: R knee pain Pain Descriptors / Indicators: Burning;Sore;Tender;Discomfort;Crying;Moaning;Guarding;Grimacing;Operative site guarding Pain Intervention(s): Limited activity within patient's tolerance;Monitored during session;Repositioned;Premedicated before session    Home Living                      Prior Function  PT Goals (current goals can now be found in the care plan section) Acute Rehab PT Goals Patient Stated Goal: to return home, but not today PT Goal Formulation: With patient Time For Goal Achievement: 12/04/18 Potential to Achieve Goals: Fair Progress towards PT goals: Not progressing toward goals - comment(due to severe pain and lightheaded with mobility. Nausea.)    Frequency    7X/week      PT Plan      Co-evaluation              AM-PAC PT "6 Clicks" Mobility   Outcome Measure  Help needed turning from your back to your side while in a flat bed without using bedrails?: A Little Help needed moving from lying on your back to sitting on the side of a  flat bed without using bedrails?: A Little Help needed moving to and from a bed to a chair (including a wheelchair)?: Total Help needed standing up from a chair using your arms (e.g., wheelchair or bedside chair)?: Total Help needed to walk in hospital room?: Total Help needed climbing 3-5 steps with a railing? : Total 6 Click Score: 10    End of Session   Activity Tolerance: Patient limited by pain;Treatment limited secondary to medical complications (Comment)(becomes very lightheaded upon sitting up. Possibly due to pain) Patient left: in bed;with call bell/phone within reach Nurse Communication: Mobility status;Other (comment)(patient asking for muscle relaxer)       Time: 0930-1002 PT Time Calculation (min) (ACUTE ONLY): 32 min  Charges:  $Therapeutic Activity: 23-37 mins                     Casha Estupinan, PT, GCS 11/28/18,10:16 AM

## 2018-11-28 NOTE — Progress Notes (Signed)
  Subjective: [name] is a 68 y.o. female s/p Right TKA.  They are POD1.  Pt's pain is moderate but overall controlled.  Pt has not ambulated yet since surgery.  She was unable to mobilize well in PT yesterday due to nausea and dizziness.    Objective: Vital signs in last 24 hours: Temp:  [97.6 F (36.4 C)-98.5 F (36.9 C)] 98.5 F (36.9 C) (11/11 0802) Pulse Rate:  [66-93] 93 (11/11 0802) Resp:  [8-25] 19 (11/11 0802) BP: (75-108)/(49-73) 105/58 (11/11 0802) SpO2:  [93 %-100 %] 100 % (11/11 0802)  Intake/Output from previous day: 11/10 0701 - 11/11 0700 In: 2327.2 [P.O.:240; I.V.:1987.2; IV Piggyback:100] Out: 50 [Blood:50] Intake/Output this shift: Total I/O In: 360 [P.O.:360] Out: -   Exam:  No gross blood or drainage overlying the dressing 2+ PT pulse Sensation intact distally in the right foot Able to dorsiflex and plantarflex the right foot   Labs: No results for input(s): HGB in the last 72 hours. No results for input(s): WBC, RBC, HCT, PLT in the last 72 hours. No results for input(s): NA, K, CL, CO2, BUN, CREATININE, GLUCOSE, CALCIUM in the last 72 hours. No results for input(s): LABPT, INR in the last 72 hours.  Assessment/Plan: Pt is POD1 s/p right TKA.    -Plan to discharge to home likely in next 2-3 days pending patient's pain and PT eval  -WBAT with a walker  -Okay to shower, dressing is waterproof.  Cautioned patient against soaking dressing in bath/pool/body of water  -Encouraged the use of the blue cradle boot to work on extension.  Cautioned patient against using a pillow under their knee.  -Use the CPM machine at least 3 times per day for one hour each time, increasing the degrees daily.   Yavuz Kirby L Isabella Ida 11/28/2018, 8:43 AM

## 2018-11-29 NOTE — Plan of Care (Signed)

## 2018-11-29 NOTE — Progress Notes (Signed)
Physical Therapy Treatment Patient Details Name: [name] MRN: [id number] DOB: Jul 10, 1950 Today's Date: 11/29/2018    History of Present Illness Patient is a 68 y/o female with h/o back surgery and hearing loss now s/p R TKA.    PT Comments    Pt in bed and agreeable to PT session. Pt demos improved bed mobility and pain control allowing for use of bed pan at beginning of session (pt was not sure she could make it to [facility], and opted for bed pan). Pt was then able to come to sitting EOB with only minA of RLE for comfort. Pt was then able to stand and ambulate in room with min guard and RW. The patient ambulates with a gait speed of 0.28m/s using RW a. A gait speed less than 0.23m/s indicates increased risk of falls and dependence in ADLs, and therefore the patient will continue to benefit from skilled PT to address limitations in functional endurance and mobility. The pt was educated in exercises she can complete in the chair and agreeable to use of BSC in future rather than relying on bed pan due to improvements in mobility.     Follow Up Recommendations  Follow surgeon's recommendation for DC plan and follow-up therapies;Home health PT;Supervision/Assistance - 24 hour     Equipment Recommendations  None recommended by PT    Recommendations for Other Services       Precautions / Restrictions Precautions Precautions: Fall;Knee Precaution Comments: watch BP Required Braces or Orthoses: Knee Immobilizer - Right Restrictions Weight Bearing Restrictions: Yes RLE Weight Bearing: Weight bearing as tolerated Other Position/Activity Restrictions: WBAT    Mobility  Bed Mobility Overal bed mobility: Needs Assistance Bed Mobility: Supine to Sit     Supine to sit: Min assist;HOB elevated     General bed mobility comments: minA for RLE to support leg for pt comfort. pt able to come to sitting EOB using elevated HOB and rails  Transfers Overall transfer level: Needs  assistance Equipment used: Rolling walker (2 wheeled) Transfers: Sit to/from Stand Sit to Stand: Min assist         General transfer comment: Pt able to stand with minA for support, VCs for posture and hip ext. No dizziness like in past PT sessions  Ambulation/Gait Ambulation/Gait assistance: Min guard Gait Distance (Feet): 20 Feet Assistive device: Rolling walker (2 wheeled) Gait Pattern/deviations: Step-to pattern;Decreased step length - left;Decreased stance time - right;Decreased stride length;Decreased weight shift to right Gait velocity: 0.12 m/s Gait velocity interpretation: <1.31 ft/sec, indicative of household ambulator General Gait Details: Pt ambulates slowly and with poor gait pattern as indicated above.   Stairs             Wheelchair Mobility    Modified Rankin (Stroke Patients Only)       Balance Overall balance assessment: Needs assistance Sitting-balance support: Feet supported Sitting balance-Leahy Scale: Good     Standing balance support: During functional activity;Bilateral upper extremity supported Standing balance-Leahy Scale: Fair Standing balance comment: Pt able to use single UE for support with static standing, BUE for pain management with ambulation                            Cognition Arousal/Alertness: Awake/alert Behavior During Therapy: WFL for tasks assessed/performed;Anxious(slight fear of pain, improved after mobility) Overall Cognitive Status: Within Functional Limits for tasks assessed  Exercises      General Comments        Pertinent Vitals/Pain Pain Assessment: Faces Faces Pain Scale: Hurts little more Pain Location: R knee pain Pain Descriptors / Indicators: Burning;Guarding;Grimacing;Discomfort;Operative site guarding;Sore Pain Intervention(s): Limited activity within patient's tolerance;Monitored during session;Premedicated before  session;Repositioned    Home Living                      Prior Function            PT Goals (current goals can now be found in the care plan section) Acute Rehab PT Goals Patient Stated Goal: to return home, but not today PT Goal Formulation: With patient Time For Goal Achievement: 12/04/18 Potential to Achieve Goals: Good Progress towards PT goals: Progressing toward goals    Frequency    7X/week      PT Plan Current plan remains appropriate    Co-evaluation              AM-PAC PT "6 Clicks" Mobility   Outcome Measure  Help needed turning from your back to your side while in a flat bed without using bedrails?: A Little Help needed moving from lying on your back to sitting on the side of a flat bed without using bedrails?: A Little Help needed moving to and from a bed to a chair (including a wheelchair)?: A Little Help needed standing up from a chair using your arms (e.g., wheelchair or bedside chair)?: A Little Help needed to walk in hospital room?: A Little Help needed climbing 3-5 steps with a railing? : A Lot 6 Click Score: 17    End of Session Equipment Utilized During Treatment: Gait belt Activity Tolerance: Patient tolerated treatment well Patient left: with call bell/phone within reach;in chair Nurse Communication: Mobility status PT Visit Diagnosis: Muscle weakness (generalized) (M62.81);Difficulty in walking, not elsewhere classified (R26.2);Pain Pain - Right/Left: Right Pain - part of body: Knee     Time: [id number] PT Time Calculation (min) (ACUTE ONLY): 35 min  Charges:  $Gait Training: 8-22 mins $Therapeutic Activity: 8-22 mins                     Mickey Farber, PT, DPT   Acute Rehabilitation Department [phone]   Otho Bellows 11/29/2018, 9:50 AM

## 2018-11-29 NOTE — Progress Notes (Signed)
  Subjective: [name] is a 68 y.o. female s/p right TKA.  They are POD2.  Pt's pain is moderate but overall controlled. She notes pain is improved compared to yesterday.  She denies any nausea or vomiting today.  Pt denies numbness/tingling/weakness.  Patient was not able to ambulate in physical therapy yesterday but she was able to stand on the knee which is an improvement.   Objective: Vital signs in last 24 hours: Temp:  [98 F (36.7 C)-99.4 F (37.4 C)] 98.6 F (37 C) (11/12 0809) Pulse Rate:  [95-100] 96 (11/12 0809) Resp:  [18] 18 (11/12 0809) BP: (97-119)/(53-69) 119/69 (11/12 0809) SpO2:  [96 %-99 %] 99 % (11/12 0809)  Intake/Output from previous day: 11/11 0701 - 11/12 0700 In: 840 [P.O.:840] Out: -  Intake/Output this shift: Total I/O In: 240 [P.O.:240] Out: -   Exam:  No gross blood or drainage overlying the dressing 2+ DP pulse Sensation intact distally in the right foot Able to dorsiflex and plantarflex the right foot   Labs: No results for input(s): HGB in the last 72 hours. No results for input(s): WBC, RBC, HCT, PLT in the last 72 hours. No results for input(s): NA, K, CL, CO2, BUN, CREATININE, GLUCOSE, CALCIUM in the last 72 hours. No results for input(s): LABPT, INR in the last 72 hours.  Assessment/Plan: Pt is POD 2 s/p right TKA.    -Plan to discharge to home within the next couple days pending patient's pain and PT eval  -WBAT with a walker  -Okay to shower, dressing is waterproof.  Cautioned patient against soaking dressing in bath/pool/body of water  -Encouraged the use of the blue cradle boot to work on extension.  Cautioned patient against using a pillow under their knee.  -Use the CPM machine at least 3 times per day for one hour each time, increasing the degrees daily.     Orlie Cundari L Byrd Terrero 11/29/2018, 8:59 AM

## 2018-11-29 NOTE — Progress Notes (Signed)
Pt stable - feels better today Walking in halls - stairs am Pain ok Dc noon tomorrow

## 2018-11-29 NOTE — Progress Notes (Signed)
Physical Therapy Treatment Patient Details Name: [name] MRN: [id number] DOB: [birth year] Today's Date: 11/29/2018    History of Present Illness Patient is a 68 y/o female with h/o back surgery and hearing loss now s/p R TKA.    PT Comments    Pt OOB in recliner upon PT arrival with spouse present. Pt agreeable to PT session and reports continued good pain management. Pt was able to demo sig improvements in transfer and ambulation tolerance, requiring sig less time and assist for sit-stand transfers and able to walk sig further (20 ft vs 150 ft) this afternoon. Pt was educated in LE exercises that can be completed with knee immobilizer on, and will progress to stair training tomorrow to facilitate d/c. Pt will continue to benefit from skilled PT to maximize rehab, independence with mobility, and safety for d/c home.     Follow Up Recommendations  Follow surgeon's recommendation for DC plan and follow-up therapies;Home health PT;Supervision/Assistance - 24 hour     Equipment Recommendations  None recommended by PT    Recommendations for Other Services       Precautions / Restrictions Precautions Precautions: Fall;Knee Required Braces or Orthoses: Knee Immobilizer - Right Restrictions Weight Bearing Restrictions: Yes RLE Weight Bearing: Weight bearing as tolerated    Mobility  Bed Mobility               General bed mobility comments: Pt OOB in recliner upon PT arrival, returned to recliner  Transfers Overall transfer level: Needs assistance Equipment used: Rolling walker (2 wheeled) Transfers: Sit to/from Stand Sit to Stand: Min assist         General transfer comment: Pt able to stand with minA for support/balance safety. No LOB  Ambulation/Gait Ambulation/Gait assistance: Min guard Gait Distance (Feet): 150 Feet Assistive device: Rolling walker (2 wheeled) Gait Pattern/deviations: Step-to pattern;Decreased step length - left;Decreased stance time -  right;Decreased stride length;Decreased weight shift to right Gait velocity: 0.12 m/s Gait velocity interpretation: <1.31 ft/sec, indicative of household ambulator General Gait Details: Pt ambulates slowly and with poor gait pattern as indicated above.   Stairs             Wheelchair Mobility    Modified Rankin (Stroke Patients Only)       Balance Overall balance assessment: Needs assistance Sitting-balance support: Feet supported Sitting balance-Leahy Scale: Good     Standing balance support: During functional activity;Bilateral upper extremity supported Standing balance-Leahy Scale: Fair Standing balance comment: Pt able to use single UE for support with static standing, BUE for pain management with ambulation                            Cognition   Behavior During Therapy: [facility] for tasks assessed/performed;Anxious Overall Cognitive Status: Within Functional Limits for tasks assessed                                        Exercises Total Joint Exercises Ankle Circles/Pumps: AROM;20 reps;Both Quad Sets: AROM;10 reps;Right;Supine    General Comments        Pertinent Vitals/Pain Pain Assessment: 0-10 Pain Score: 2  Pain Location: R knee pain Pain Descriptors / Indicators: Burning;Guarding;Grimacing;Discomfort;Operative site guarding;Sore Pain Intervention(s): Limited activity within patient's tolerance;Monitored during session;Repositioned    Home Living  Prior Function            PT Goals (current goals can now be found in the care plan section) Acute Rehab PT Goals Patient Stated Goal: to return home PT Goal Formulation: With patient Time For Goal Achievement: 12/04/18 Potential to Achieve Goals: Good Progress towards PT goals: Progressing toward goals    Frequency    7X/week      PT Plan Current plan remains appropriate    Co-evaluation              AM-PAC PT "6 Clicks"  Mobility   Outcome Measure  Help needed turning from your back to your side while in a flat bed without using bedrails?: A Little Help needed moving from lying on your back to sitting on the side of a flat bed without using bedrails?: A Little Help needed moving to and from a bed to a chair (including a wheelchair)?: A Little Help needed standing up from a chair using your arms (e.g., wheelchair or bedside chair)?: A Little Help needed to walk in hospital room?: A Little Help needed climbing 3-5 steps with a railing? : A Lot 6 Click Score: 17    End of Session Equipment Utilized During Treatment: Gait belt Activity Tolerance: Patient tolerated treatment well Patient left: with call bell/phone within reach;in chair;with family/visitor present Nurse Communication: Mobility status PT Visit Diagnosis: Muscle weakness (generalized) (M62.81);Difficulty in walking, not elsewhere classified (R26.2);Pain Pain - Right/Left: Right Pain - part of body: Knee     Time: [id number] PT Time Calculation (min) (ACUTE ONLY): 26 min  Charges:  $Gait Training: 8-22 mins $Therapeutic Exercise: 8-22 mins                     Mickey Farber, PT, DPT   Acute Rehabilitation Department [phone]   Otho Bellows 11/29/2018, 4:02 PM

## 2018-11-29 NOTE — Plan of Care (Signed)
?  Problem: Education: ?Goal: Knowledge of General Education information will improve ?Description: Including pain rating scale, medication(s)/side effects and non-pharmacologic comfort measures ?Outcome: Progressing ?  ?Problem: Activity: ?Goal: Risk for activity intolerance will decrease ?Outcome: Progressing ?  ?Problem: Nutrition: ?Goal: Adequate nutrition will be maintained ?Outcome: Progressing ?  ?Problem: Coping: ?Goal: Level of anxiety will decrease ?Outcome: Progressing ?  ?Problem: Elimination: ?Goal: Will not experience complications related to urinary retention ?Outcome: Progressing ?  ?Problem: Pain Managment: ?Goal: General experience of comfort will improve ?Outcome: Progressing ?  ?Problem: Safety: ?Goal: Ability to remain free from injury will improve ?Outcome: Progressing ?  ?

## 2018-11-30 MED ORDER — OXYCODONE HCL 5 MG PO TABS: 5.0000 mg | ORAL_TABLET | ORAL | 0 refills | Status: DC | PRN

## 2018-11-30 MED ORDER — METHOCARBAMOL 500 MG PO TABS: 500.0000 mg | ORAL_TABLET | Freq: Three times a day (TID) | ORAL | 0 refills | Status: DC | PRN

## 2018-11-30 NOTE — Progress Notes (Signed)
Patient stable pain controlled Plan discharge today Follow-up 10 days

## 2018-11-30 NOTE — Progress Notes (Signed)
Physical Therapy Treatment Patient Details Name: [name] MRN: [id number] DOB: Mar 10, 1950 Today's Date: 11/30/2018    History of Present Illness Patient is a 68 y/o female with h/o back surgery and hearing loss now s/p R TKA.    PT Comments    Pt OOB in recliner with spouse present upon PT arrival today, agreeable to session. Pt was able to demonstrate good navigation of 3 steps in preparation for d/c home with use of one rail and minA/min guard from spouse. Pt and spouse practiced stair navigation x3 and feel all questions have been answered sufficiently. Pt then ambulated back to room with improved gait speed and pattern with reports of no change in pain. The pt and her husband were then educated on HEP, talked through the handout, and demonstrated multiple LE exercises that can be done in the chair to facilitate return to prior level of function. The pt will continue to benefit from skilled PT to continue maximizing rehab potential, improving mobility, and independence with mobility.     Follow Up Recommendations  Follow surgeon's recommendation for DC plan and follow-up therapies;Home health PT;Supervision/Assistance - 24 hour     Equipment Recommendations  None recommended by PT    Recommendations for Other Services       Precautions / Restrictions Precautions Precautions: Fall;Knee Required Braces or Orthoses: Knee Immobilizer - Right Knee Immobilizer - Right: Other (comment)(Per ortho tech, KI can be removed for performance of exercises while in chair, but should be worn for ambulation and therefore reapplied before leaving chair) Restrictions Weight Bearing Restrictions: Yes RLE Weight Bearing: Weight bearing as tolerated Other Position/Activity Restrictions: WBAT    Mobility  Bed Mobility               General bed mobility comments: Pt OOB in recliner upon PT arrival, returned to recliner  Transfers Overall transfer level: Needs assistance Equipment  used: Rolling walker (2 wheeled) Transfers: Sit to/from Stand Sit to Stand: Min guard         General transfer comment: Pt able to stand from recliner without assist, min guard for safety  Ambulation/Gait Ambulation/Gait assistance: Min guard Gait Distance (Feet): 150 Feet Assistive device: Rolling walker (2 wheeled) Gait Pattern/deviations: Step-to pattern;Decreased step length - left;Decreased stance time - right;Decreased stride length;Decreased weight shift to right Gait velocity: 0.3 m/s Gait velocity interpretation: <1.31 ft/sec, indicative of household ambulator General Gait Details: Pt ambulates slowly and with poor gait pattern as indicated above.   Stairs Stairs: Yes Stairs assistance: Min guard;Min assist Stair Management: Two rails;One rail Right;Step to pattern;Forwards Number of Stairs: 3 General stair comments: Pt navigated 3 steps x 3 with decreasing need for assist and rail useage. Pt first completed with 2 rails and minA, progressed to min guard with 1 rail. Pt and husband educated on stair technique, no questions at this time.   Wheelchair Mobility    Modified Rankin (Stroke Patients Only)       Balance Overall balance assessment: Needs assistance Sitting-balance support: Feet supported Sitting balance-Leahy Scale: Good     Standing balance support: During functional activity;Bilateral upper extremity supported Standing balance-Leahy Scale: Fair Standing balance comment: Pt able to use single UE for support with static standing, BUE for pain management with ambulation                            Cognition Arousal/Alertness: Awake/alert Behavior During Therapy: WFL for tasks assessed/performed;Anxious Overall Cognitive Status:  Within Functional Limits for tasks assessed                                        Exercises Total Joint Exercises Quad Sets: AROM;10 reps;Right;Supine Heel Slides: AAROM;Right;Supine;5  reps Long Arc Quad: AROM;Both;10 reps;Seated Knee Flexion: AROM;Right;5 reps Other Exercises Other Exercises: Seated Calf Raises 2 x 10 both    General Comments        Pertinent Vitals/Pain Pain Score: 1  Pain Location: R knee pain Pain Descriptors / Indicators: Burning;Guarding;Grimacing;Discomfort;Operative site guarding;Sore Pain Intervention(s): Limited activity within patient's tolerance;Monitored during session;Repositioned;Premedicated before session    Home Living                      Prior Function            PT Goals (current goals can now be found in the care plan section) Acute Rehab PT Goals Patient Stated Goal: to return home PT Goal Formulation: With patient/family Time For Goal Achievement: 12/04/18 Potential to Achieve Goals: Good Progress towards PT goals: Progressing toward goals    Frequency    7X/week      PT Plan Current plan remains appropriate    Co-evaluation              AM-PAC PT "6 Clicks" Mobility   Outcome Measure  Help needed turning from your back to your side while in a flat bed without using bedrails?: A Little Help needed moving from lying on your back to sitting on the side of a flat bed without using bedrails?: A Little Help needed moving to and from a bed to a chair (including a wheelchair)?: A Little Help needed standing up from a chair using your arms (e.g., wheelchair or bedside chair)?: A Little Help needed to walk in hospital room?: A Little Help needed climbing 3-5 steps with a railing? : A Little 6 Click Score: 18    End of Session Equipment Utilized During Treatment: Gait belt Activity Tolerance: Patient tolerated treatment well Patient left: with call bell/phone within reach;in chair;with family/visitor present Nurse Communication: Mobility status PT Visit Diagnosis: Muscle weakness (generalized) (M62.81);Difficulty in walking, not elsewhere classified (R26.2);Pain Pain - Right/Left: Right Pain -  part of body: Knee     Time: [id number] PT Time Calculation (min) (ACUTE ONLY): 44 min  Charges:  $Gait Training: 23-37 mins $Therapeutic Exercise: 8-22 mins                     Mickey Farber, PT, DPT   Acute Rehabilitation Department [phone]   Otho Bellows 11/30/2018, 11:51 AM

## 2018-11-30 NOTE — Progress Notes (Signed)
  Subjective: [name] is a 68 y.o. female s/p right TKA.  They are POD3.  Pt's pain is controlled but worse than yesterday since PT.  Pt has ambulated with some difficulty in PT yesterday but was able to walk a good distance, according to pt.     Objective: Vital signs in last 24 hours: Temp:  [98.6 F (37 C)-99.2 F (37.3 C)] 99 F (37.2 C) (11/13 0744) Pulse Rate:  [56-99] 86 (11/13 0744) Resp:  [18] 18 (11/13 0744) BP: (98-118)/(51-95) 114/61 (11/13 0744) SpO2:  [96 %-97 %] 96 % (11/13 0744)  Intake/Output from previous day: 11/12 0701 - 11/13 0700 In: 600 [P.O.:600] Out: 400 [Urine:400] Intake/Output this shift: Total I/O In: 240 [P.O.:240] Out: -   Exam:  No gross blood or drainage overlying the dressing 2+ PT pulse Sensation intact distally in the right foot Able to dorsiflex and plantarflex the right foot   Labs: No results for input(s): HGB in the last 72 hours. No results for input(s): WBC, RBC, HCT, PLT in the last 72 hours. No results for input(s): NA, K, CL, CO2, BUN, CREATININE, GLUCOSE, CALCIUM in the last 72 hours. No results for input(s): LABPT, INR in the last 72 hours.  Assessment/Plan: Pt is POD3 s/p right TKA.    -Plan to discharge to home today or tomorrow pending patient's pain and PT eval.    -WBAT with a walker  -Okay to shower, dressing is waterproof.  Cautioned patient against soaking dressing in bath/pool/body of water  -Encouraged the use of the blue cradle boot to work on extension.  Cautioned patient against using a pillow under their knee.  -Use the CPM machine at least 3 times per day for one hour each time, increasing the degrees daily.     Kaylla Cobos L Rayshell Goecke 11/30/2018, 10:26 AM

## 2018-12-01 DIAGNOSIS — Z7982 Long term (current) use of aspirin: Secondary | ICD-10-CM | POA: Diagnosis not present

## 2018-12-01 DIAGNOSIS — K219 Gastro-esophageal reflux disease without esophagitis: Secondary | ICD-10-CM | POA: Diagnosis not present

## 2018-12-01 DIAGNOSIS — Z96651 Presence of right artificial knee joint: Secondary | ICD-10-CM | POA: Diagnosis not present

## 2018-12-01 DIAGNOSIS — I8390 Asymptomatic varicose veins of unspecified lower extremity: Secondary | ICD-10-CM | POA: Diagnosis not present

## 2018-12-01 DIAGNOSIS — M1990 Unspecified osteoarthritis, unspecified site: Secondary | ICD-10-CM | POA: Diagnosis not present

## 2018-12-01 DIAGNOSIS — Z8701 Personal history of pneumonia (recurrent): Secondary | ICD-10-CM | POA: Diagnosis not present

## 2018-12-01 DIAGNOSIS — H90A31 Mixed conductive and sensorineural hearing loss, unilateral, right ear with restricted hearing on the contralateral side: Secondary | ICD-10-CM | POA: Diagnosis not present

## 2018-12-01 DIAGNOSIS — E039 Hypothyroidism, unspecified: Secondary | ICD-10-CM | POA: Diagnosis not present

## 2018-12-01 DIAGNOSIS — Z471 Aftercare following joint replacement surgery: Secondary | ICD-10-CM | POA: Diagnosis not present

## 2018-12-01 DIAGNOSIS — Z791 Long term (current) use of non-steroidal anti-inflammatories (NSAID): Secondary | ICD-10-CM | POA: Diagnosis not present

## 2018-12-01 DIAGNOSIS — M4316 Spondylolisthesis, lumbar region: Secondary | ICD-10-CM | POA: Diagnosis not present

## 2018-12-03 ENCOUNTER — Other Ambulatory Visit: Payer: Self-pay | Admitting: *Deleted

## 2018-12-03 ENCOUNTER — Telehealth: Payer: Self-pay | Admitting: *Deleted

## 2018-12-03 ENCOUNTER — Telehealth: Payer: Self-pay | Admitting: Orthopedic Surgery

## 2018-12-03 DIAGNOSIS — M1990 Unspecified osteoarthritis, unspecified site: Secondary | ICD-10-CM | POA: Diagnosis not present

## 2018-12-03 DIAGNOSIS — Z96651 Presence of right artificial knee joint: Secondary | ICD-10-CM

## 2018-12-03 DIAGNOSIS — M4316 Spondylolisthesis, lumbar region: Secondary | ICD-10-CM | POA: Diagnosis not present

## 2018-12-03 DIAGNOSIS — K219 Gastro-esophageal reflux disease without esophagitis: Secondary | ICD-10-CM | POA: Diagnosis not present

## 2018-12-03 DIAGNOSIS — E039 Hypothyroidism, unspecified: Secondary | ICD-10-CM | POA: Diagnosis not present

## 2018-12-03 DIAGNOSIS — M1711 Unilateral primary osteoarthritis, right knee: Secondary | ICD-10-CM

## 2018-12-03 DIAGNOSIS — Z471 Aftercare following joint replacement surgery: Secondary | ICD-10-CM | POA: Diagnosis not present

## 2018-12-03 NOTE — Care Plan (Signed)
RNCM call to patient to check status after discharge from hospital on Friday, 11/30/18. She reports she is doing well. HHPT has been out to start care on Saturday and will be coming back today. No issues at this time. She is currently using her home CPM and reports this is going well. F/U will MD on 12/12/18. OPPT to begin here at [facility] on Monday, 12/17/18 at 1:15 pm.

## 2018-12-03 NOTE — Telephone Encounter (Signed)
Ortho bundle D/C call completed. 

## 2018-12-03 NOTE — Telephone Encounter (Signed)
Received voicemail message from Amy Lynch-PT with [facility] needing verbal orders for HHPT 3 Wk 2 for patient knee replacement   The number to contact Amy is [phone]

## 2018-12-03 NOTE — Telephone Encounter (Signed)
Tried calling. No answer. LMVM advising ok for orders.

## 2018-12-05 ENCOUNTER — Telehealth: Payer: Self-pay | Admitting: *Deleted

## 2018-12-05 DIAGNOSIS — Z96651 Presence of right artificial knee joint: Secondary | ICD-10-CM | POA: Diagnosis not present

## 2018-12-05 DIAGNOSIS — E039 Hypothyroidism, unspecified: Secondary | ICD-10-CM | POA: Diagnosis not present

## 2018-12-05 DIAGNOSIS — K219 Gastro-esophageal reflux disease without esophagitis: Secondary | ICD-10-CM | POA: Diagnosis not present

## 2018-12-05 DIAGNOSIS — Z471 Aftercare following joint replacement surgery: Secondary | ICD-10-CM | POA: Diagnosis not present

## 2018-12-05 DIAGNOSIS — M1990 Unspecified osteoarthritis, unspecified site: Secondary | ICD-10-CM | POA: Diagnosis not present

## 2018-12-05 DIAGNOSIS — M4316 Spondylolisthesis, lumbar region: Secondary | ICD-10-CM | POA: Diagnosis not present

## 2018-12-05 NOTE — Telephone Encounter (Signed)
7 day Post-op call completed; patient doing well.

## 2018-12-07 DIAGNOSIS — K219 Gastro-esophageal reflux disease without esophagitis: Secondary | ICD-10-CM | POA: Diagnosis not present

## 2018-12-07 DIAGNOSIS — Z96651 Presence of right artificial knee joint: Secondary | ICD-10-CM | POA: Diagnosis not present

## 2018-12-07 DIAGNOSIS — Z471 Aftercare following joint replacement surgery: Secondary | ICD-10-CM | POA: Diagnosis not present

## 2018-12-07 DIAGNOSIS — M4316 Spondylolisthesis, lumbar region: Secondary | ICD-10-CM | POA: Diagnosis not present

## 2018-12-07 DIAGNOSIS — E039 Hypothyroidism, unspecified: Secondary | ICD-10-CM | POA: Diagnosis not present

## 2018-12-07 DIAGNOSIS — M1990 Unspecified osteoarthritis, unspecified site: Secondary | ICD-10-CM | POA: Diagnosis not present

## 2018-12-09 DIAGNOSIS — Z471 Aftercare following joint replacement surgery: Secondary | ICD-10-CM | POA: Diagnosis not present

## 2018-12-09 DIAGNOSIS — E039 Hypothyroidism, unspecified: Secondary | ICD-10-CM | POA: Diagnosis not present

## 2018-12-09 DIAGNOSIS — M4316 Spondylolisthesis, lumbar region: Secondary | ICD-10-CM | POA: Diagnosis not present

## 2018-12-09 DIAGNOSIS — Z96651 Presence of right artificial knee joint: Secondary | ICD-10-CM | POA: Diagnosis not present

## 2018-12-09 DIAGNOSIS — K219 Gastro-esophageal reflux disease without esophagitis: Secondary | ICD-10-CM | POA: Diagnosis not present

## 2018-12-09 DIAGNOSIS — M1990 Unspecified osteoarthritis, unspecified site: Secondary | ICD-10-CM | POA: Diagnosis not present

## 2018-12-10 NOTE — Discharge Summary (Signed)
Physician Discharge Summary      Patient ID: [name] MRN: [id number] DOB/AGE: 1950/09/07 68 y.o.  Admit date: 11/27/2018 Discharge date: 11/30/2018  Admission Diagnoses:  Active Problems:   Arthritis of right knee   Discharge Diagnoses:  Same  Surgeries: Procedure(s): RIGHT TOTAL KNEE ARTHROPLASTY on 11/27/2018   Consultants:   Discharged Condition: Stable  Hospital Course: [name] is an 68 y.o. female who was admitted 11/27/2018 with a chief complaint of right knee pain, and found to have a diagnosis of right knee OA.  They were brought to the operating room on 11/27/2018 and underwent the above named procedures.  Pt awoke from anesthesia without complication and was transferred to the floor. On POD1, patient was not able to mobilize well in PT due to nausea and dizziness.  Pt steadily progressed in PT over the next 2 days.  She was without nausea/dizziness after POD1 and her pain was overall controlled through her hospital stay.  Patient was discharged home on POD3.  Pt will f/u with Dr. Marlou Barnes in clinic in ~2 weeks.   Antibiotics given:  Anti-infectives (From admission, onward)   Start     Dose/Rate Route Frequency Ordered Stop   11/27/18 1215  ceFAZolin (ANCEF) IVPB 2g/100 mL premix     2 g 200 mL/hr over 30 Minutes Intravenous Every 6 hours 11/27/18 1200 11/28/18 0014   11/27/18 0630  ceFAZolin (ANCEF) IVPB 2g/100 mL premix     2 g 200 mL/hr over 30 Minutes Intravenous On call to O.R. 11/27/18 0622 11/27/18 0800    .  Recent vital signs:  Vitals:   11/30/18 0457 11/30/18 0744  BP: (!) 99/51 114/61  Pulse: 83 86  Resp:  18  Temp: 98.6 F (37 C) 99 F (37.2 C)  SpO2: 96% 96%    Recent laboratory studies:  Results for orders placed or performed during the hospital encounter of 11/27/18  Urinalysis, Routine w reflex microscopic  Result Value Ref Range   Color, Urine YELLOW YELLOW   APPearance CLEAR CLEAR   Specific Gravity, Urine 1.013  1.005 - 1.030   pH 5.0 5.0 - 8.0   Glucose, UA NEGATIVE NEGATIVE mg/dL   Hgb urine dipstick NEGATIVE NEGATIVE   Bilirubin Urine NEGATIVE NEGATIVE   Ketones, ur NEGATIVE NEGATIVE mg/dL   Protein, ur NEGATIVE NEGATIVE mg/dL   Nitrite NEGATIVE NEGATIVE   Leukocytes,Ua TRACE (A) NEGATIVE   RBC / HPF 0-5 0 - 5 RBC/hpf   WBC, UA 0-5 0 - 5 WBC/hpf   Bacteria, UA RARE (A) NONE SEEN   Squamous Epithelial / LPF 0-5 0 - 5   Hyaline Casts, UA PRESENT     Discharge Medications:   Allergies as of 11/30/2018   No Known Allergies     Medication List    STOP taking these medications   traMADol 50 MG tablet Commonly known as: Ultram     TAKE these medications   aspirin EC 81 MG tablet Take 81 mg by mouth daily.   CALCIUM 600 + D PO Take 1 tablet by mouth daily.   Coenzyme Q10 300 MG Caps Take 300 mg by mouth daily.   famotidine 20 MG tablet Commonly known as: PEPCID Take 20 mg by mouth daily as needed for heartburn or indigestion.   gabapentin 300 MG capsule Commonly known as: NEURONTIN TAKE 1 CAPSULE BY MOUTH EVERYDAY AT BEDTIME   ibuprofen 200 MG tablet Commonly known as: ADVIL Take 400-800 mg by mouth daily.  latanoprost 0.005 % ophthalmic solution Commonly known as: XALATAN Place 1 drop into both eyes at bedtime.   levothyroxine 112 MCG tablet Commonly known as: SYNTHROID Take 112 mcg by mouth daily before breakfast.   methocarbamol 500 MG tablet Commonly known as: ROBAXIN Take 1 tablet (500 mg total) by mouth every 8 (eight) hours as needed for muscle spasms.   multivitamin with minerals Tabs tablet Take 1 tablet by mouth daily.   oxyCODONE 5 MG immediate release tablet Commonly known as: Oxy IR/ROXICODONE Take 1 tablet (5 mg total) by mouth every 4 (four) hours as needed for moderate pain (pain score 4-6).   simvastatin 10 MG tablet Commonly known as: ZOCOR Take 10 mg by mouth at bedtime.   timolol 0.5 % ophthalmic solution Commonly known as: TIMOPTIC  Place 1 drop into both eyes 2 (two) times daily.       Diagnostic Studies: No results found.  Disposition:   Discharge Instructions    Call MD / Call 911   Complete by: As directed    If you experience chest pain or shortness of breath, CALL 911 and be transported to the hospital emergency room.  If you develope a fever above 101 F, pus (white drainage) or increased drainage or redness at the wound, or calf pain, call your surgeon's office.   Constipation Prevention   Complete by: As directed    Drink plenty of fluids.  Prune juice may be helpful.  You may use a stool softener, such as Colace (over the counter) 100 mg twice a day.  Use MiraLax (over the counter) for constipation as needed.   Diet - low sodium heart healthy   Complete by: As directed    Discharge instructions   Complete by: As directed    You may shower, dressing is waterproof.  Do not remove the dressing, we will remove it at your first post-op appointment.  Do not take a bath or soak the knee in a tub or pool.  You may weightbear as you can tolerate on the operative leg with a walker.  Continue using the CPM machine 3 times per day for one hour each time, increasing the degrees of range of motion daily.  Use the blue cradle boot under your heel to work on getting your leg straight.  Do NOT put a pillow under your knee.  You will follow-up with Dr. Marlou Barnes in the clinic in 1-2 weeks at your given appointment date.   Increase activity slowly as tolerated   Complete by: As directed       Follow-up Information    [name], Denise Corner, MD. Go on 12/12/2018.   Specialty: Orthopedic Surgery Why: at 1:00 pm for your 2 week post-op appointment with Dr. Marlou Barnes. Contact information: [place] [address] [phone]        [facility]-Home Follow up.   Specialty: Home Health Services Why: You have been authorized for 5 home health therapy visits prior to your first appointment with Dr. Marlou Barnes.  Someone from the agency will be in contact with you once you are discharged home to set up an appointment time/date in your home.           SignedDonella Stade 12/10/2018, 8:05 PM

## 2018-12-11 DIAGNOSIS — M4316 Spondylolisthesis, lumbar region: Secondary | ICD-10-CM | POA: Diagnosis not present

## 2018-12-11 DIAGNOSIS — K219 Gastro-esophageal reflux disease without esophagitis: Secondary | ICD-10-CM | POA: Diagnosis not present

## 2018-12-11 DIAGNOSIS — Z471 Aftercare following joint replacement surgery: Secondary | ICD-10-CM | POA: Diagnosis not present

## 2018-12-11 DIAGNOSIS — E039 Hypothyroidism, unspecified: Secondary | ICD-10-CM | POA: Diagnosis not present

## 2018-12-11 DIAGNOSIS — Z96651 Presence of right artificial knee joint: Secondary | ICD-10-CM | POA: Diagnosis not present

## 2018-12-11 DIAGNOSIS — M1990 Unspecified osteoarthritis, unspecified site: Secondary | ICD-10-CM | POA: Diagnosis not present

## 2018-12-12 ENCOUNTER — Encounter: Payer: Self-pay | Admitting: Orthopedic Surgery

## 2018-12-12 ENCOUNTER — Other Ambulatory Visit: Payer: Self-pay

## 2018-12-12 ENCOUNTER — Telehealth: Payer: Self-pay | Admitting: *Deleted

## 2018-12-12 ENCOUNTER — Ambulatory Visit (INDEPENDENT_AMBULATORY_CARE_PROVIDER_SITE_OTHER): Payer: Medicare Other | Admitting: Orthopedic Surgery

## 2018-12-12 ENCOUNTER — Ambulatory Visit (INDEPENDENT_AMBULATORY_CARE_PROVIDER_SITE_OTHER): Payer: Medicare Other

## 2018-12-12 DIAGNOSIS — Z96651 Presence of right artificial knee joint: Secondary | ICD-10-CM

## 2018-12-12 NOTE — Care Plan (Signed)
RNCM met with patient in office for her 2 week post-op visit today. She is 15 days S/P Right TKA with Dr. Dean. She ambulates into clinic with rolling walker. States she is working with home health PT and is doing well. Demonstrates approximately 90 degrees flexion and full extension today. Has 1 more therapy visit this week and will be starting OPPT on Monday, 12/17/18. No other issues today. Reminded of how to contact CM for any further questions or needs. 

## 2018-12-12 NOTE — Telephone Encounter (Signed)
14 day Ortho bundle call completed; See care plan note.

## 2018-12-14 DIAGNOSIS — Z96651 Presence of right artificial knee joint: Secondary | ICD-10-CM | POA: Diagnosis not present

## 2018-12-14 DIAGNOSIS — E039 Hypothyroidism, unspecified: Secondary | ICD-10-CM | POA: Diagnosis not present

## 2018-12-14 DIAGNOSIS — M1990 Unspecified osteoarthritis, unspecified site: Secondary | ICD-10-CM | POA: Diagnosis not present

## 2018-12-14 DIAGNOSIS — M4316 Spondylolisthesis, lumbar region: Secondary | ICD-10-CM | POA: Diagnosis not present

## 2018-12-14 DIAGNOSIS — Z471 Aftercare following joint replacement surgery: Secondary | ICD-10-CM | POA: Diagnosis not present

## 2018-12-14 DIAGNOSIS — K219 Gastro-esophageal reflux disease without esophagitis: Secondary | ICD-10-CM | POA: Diagnosis not present

## 2018-12-15 MED ORDER — OXYCODONE HCL 5 MG PO TABS: 5.0000 mg | ORAL_TABLET | Freq: Three times a day (TID) | ORAL | 0 refills | Status: DC | PRN

## 2018-12-16 ENCOUNTER — Encounter: Payer: Self-pay | Admitting: Orthopedic Surgery

## 2018-12-16 NOTE — Progress Notes (Signed)
Post-Op Visit Note   Patient: [name]           Date of Birth: 04/19/1950           MRN: [id number] Visit Date: 12/12/2018 PCP: Leeroy Cha, MD   Assessment & Plan:  Chief Complaint:  Chief Complaint  Patient presents with  . Right Knee - Routine Post Op   Visit Diagnoses:  1. S/P total knee arthroplasty, right     Plan: Patient is a 68 year old female who presents s/p right total knee arthroplasty on 11/27/2018.  Patient states that she is doing well she is walking with a walker.  She is occasionally walking with a cane in her home.  She is only taking oxycodone at night and using Tylenol throughout the day for pain control.  She is using the CPM machine as intended and is up to 103 degrees.  She is also using ice machine which she states helps with her pain.  She is scheduled to start outpatient physical therapy next week.  On exam she has 0 degrees of extension and about 90 degrees of flexion.  She has good patellar mobility.  Incisions healing well with no evidence of dehiscence.  She has no calf tenderness on exam.  Right knee x-rays reveal a well aligned prosthesis with no complicating features.  Refill oxycodone for pain control, with outpatient physical therapy starting.  Patient will follow up in 6 weeks for clinical recheck.  Follow-Up Instructions: No follow-ups on file.   Orders:  Orders Placed This Encounter  Procedures  . XR Knee 1-2 Views Right   Meds ordered this encounter  Medications  . oxyCODONE (OXY IR/ROXICODONE) 5 MG immediate release tablet    Sig: Take 1 tablet (5 mg total) by mouth every 8 (eight) hours as needed for moderate pain (pain score 4-6).    Dispense:  30 tablet    Refill:  0    Imaging: No results found.  PMFS History: Patient Active Problem List   Diagnosis Date Noted  . Arthritis of right knee 11/27/2018  . Unilateral primary osteoarthritis, right knee 07/05/2017    Class: Chronic  . Anemia due to blood loss  07/05/2017    Class: Acute  . Spinal stenosis, lumbar region with neurogenic claudication   . Spondylolisthesis, lumbar region   . S/P lumbar spinal fusion 07/03/2017  . Mixed conductive and sensorineural hearing loss of left ear with restricted hearing of right ear 12/12/2016  . Acute laryngitis 03/10/2016  . Laryngopharyngeal reflux (LPR) 03/10/2016  . Tympanic membrane perforation, right 03/10/2016   Past Medical History:  Diagnosis Date  . Arthritis   . Carpal tunnel syndrome   . GERD (gastroesophageal reflux disease)   . Hard of hearing    left side  . Hypothyroid   . Pneumonia   . Varicose vein of leg     History reviewed. No pertinent family history.  Past Surgical History:  Procedure Laterality Date  . ABDOMINAL HYSTERECTOMY    . ABDOMINAL HYSTERECTOMY    . APPENDECTOMY    . BACK SURGERY    . CARPAL TUNNEL RELEASE Left 06/26/2018   Procedure: CARPAL TUNNEL RELEASE;  Surgeon: Daryll Brod, MD;  Location: [facility];  Service: Orthopedics;  Laterality: Left;  . cholesteatoma Left   . COLON SURGERY     part of intestine removed  . FOOT SURGERY Bilateral   . IMPLANTATION BONE ANCHORED HEARING AID Left 01/2017  . INJECTION KNEE Right 07/03/2017  Procedure: KNEE INJECTION;  Surgeon: Jessy Oto, MD;  Location: [facility];  Service: Orthopedics;  Laterality: Right;  . KNEE ARTHROSCOPY Right   . SPINAL FUSION     L4 and L5 for spinal stenosis , 06/2017  . TONSILLECTOMY    . TOTAL KNEE ARTHROPLASTY Right 11/27/2018  . TOTAL KNEE ARTHROPLASTY Right 11/27/2018   Procedure: RIGHT TOTAL KNEE ARTHROPLASTY;  Surgeon: Meredith Pel, MD;  Location: [facility];  Service: Orthopedics;  Laterality: Right;   Social History   Occupational History  . Not on file  Tobacco Use  . Smoking status: Never Smoker  . Smokeless tobacco: Never Used  Substance and Sexual Activity  . Alcohol use: Yes    Alcohol/week: 1.0 standard drinks    Types: 1 Glasses of wine per week     Comment:  drinks wine daily with dinner  . Drug use: Never  . Sexual activity: Yes

## 2018-12-17 ENCOUNTER — Ambulatory Visit (INDEPENDENT_AMBULATORY_CARE_PROVIDER_SITE_OTHER): Payer: Medicare Other | Admitting: Physical Therapy

## 2018-12-17 ENCOUNTER — Ambulatory Visit: Payer: Medicare Other | Admitting: Physical Therapy

## 2018-12-17 ENCOUNTER — Encounter: Payer: Self-pay | Admitting: Physical Therapy

## 2018-12-17 ENCOUNTER — Other Ambulatory Visit: Payer: Self-pay

## 2018-12-17 DIAGNOSIS — R600 Localized edema: Secondary | ICD-10-CM | POA: Diagnosis not present

## 2018-12-17 DIAGNOSIS — R2689 Other abnormalities of gait and mobility: Secondary | ICD-10-CM

## 2018-12-17 DIAGNOSIS — M6281 Muscle weakness (generalized): Secondary | ICD-10-CM | POA: Diagnosis not present

## 2018-12-17 DIAGNOSIS — M25561 Pain in right knee: Secondary | ICD-10-CM | POA: Diagnosis not present

## 2018-12-17 DIAGNOSIS — M25661 Stiffness of right knee, not elsewhere classified: Secondary | ICD-10-CM

## 2018-12-18 NOTE — Therapy (Signed)
[facility] [place], [place], [address] Phone: [phone]   Fax:  [phone]  Physical Therapy Evaluation  Patient Details  Name: [name] MRN: [id number] Date of Birth: 03-Oct-1950 Referring Provider (PT): Marlou Sa Tonna Corner, MD   Encounter Date: 12/17/2018  PT End of Session - 12/18/18 0857    Visit Number  1    Number of Visits  18    Date for PT Re-Evaluation  02/12/19    Authorization Type  M[employer], progress note every 10    PT Start Time  1315    PT Stop Time  1400    PT Time Calculation (min)  45 min    Activity Tolerance  Patient tolerated treatment well    Behavior During Therapy  [facility] for tasks assessed/performed       Past Medical History:  Diagnosis Date  . Arthritis   . Carpal tunnel syndrome   . GERD (gastroesophageal reflux disease)   . Hard of hearing    left side  . Hypothyroid   . Pneumonia   . Varicose vein of leg     Past Surgical History:  Procedure Laterality Date  . ABDOMINAL HYSTERECTOMY    . ABDOMINAL HYSTERECTOMY    . APPENDECTOMY    . BACK SURGERY    . CARPAL TUNNEL RELEASE Left 06/26/2018   Procedure: CARPAL TUNNEL RELEASE;  Surgeon: Daryll Brod, MD;  Location: [facility];  Service: Orthopedics;  Laterality: Left;  . cholesteatoma Left   . COLON SURGERY     part of intestine removed  . FOOT SURGERY Bilateral   . IMPLANTATION BONE ANCHORED HEARING AID Left 01/2017  . INJECTION KNEE Right 07/03/2017   Procedure: KNEE INJECTION;  Surgeon: Jessy Oto, MD;  Location: [facility];  Service: Orthopedics;  Laterality: Right;  . KNEE ARTHROSCOPY Right   . SPINAL FUSION     L4 and L5 for spinal stenosis , 06/2017  . TONSILLECTOMY    . TOTAL KNEE ARTHROPLASTY Right 11/27/2018  . TOTAL KNEE ARTHROPLASTY Right 11/27/2018   Procedure: RIGHT TOTAL KNEE ARTHROPLASTY;  Surgeon: Meredith Pel, MD;  Location: [facility];  Service: Orthopedics;  Laterality: Right;    There  were no vitals filed for this visit.   Subjective Assessment - 12/17/18 1327    Subjective  Rt knee pain avg is 4/10 overalll. She had Rt TKA on 12/07/18, she is doing pretty well since and uses CPM for ROM and had HHPT which just ended Friday.    Pertinent History  PMH: OA of back and knee, S/P lumbar fusion 2019    Patient Stated Goals  get back to normal, walk without AD    Currently in Pain?  Yes    Pain Score  4     Pain Location  Knee    Pain Orientation  Right    Pain Descriptors / Indicators  Aching    Pain Type  Surgical pain    Pain Onset  1 to 4 weeks ago    Pain Frequency  Intermittent    Aggravating Factors   pain in morning, prolonged walking or standing    Pain Relieving Factors  pain meds, moving around    Multiple Pain Sites  No         [facility] PT Assessment - 12/18/18 0001      Assessment   Medical Diagnosis  Rt TKA    Referring Provider (PT)  Marlou Sa Tonna Corner, MD  Onset Date/Surgical Date  11/27/18    Next MD Visit  01/23/19    Prior Therapy  HHPT      Balance Screen   Has the patient had a decrease in activity level because of a fear of falling?   No      Home Film/video editor residence      Prior Function   Level of Independence  Independent    Vocation  Retired      Associate Professor   Overall Cognitive Status  Within Functional Limits for tasks assessed      Observation/Other Assessments   Observations  incision site well healing, no redness, not warm to touch, no signs of infection      Observation/Other Assessments-Edema    Edema  Circumferential      Circumferential Edema   Circumferential - Right  17 in    Circumferential - Left   15 in      Sensation   Light Touch  Appears Intact      ROM / Strength   AROM / PROM / Strength  AROM;Strength;PROM      AROM   AROM Assessment Site  Knee    Right/Left Knee  Right    Right Knee Extension  5    Right Knee Flexion  90      PROM   PROM Assessment Site  Knee     Right/Left Knee  Right    Right Knee Extension  2    Right Knee Flexion  94      Strength   Strength Assessment Site  Knee;Hip    Right/Left Hip  Right    Right Hip Flexion  4/5    Right Hip ABduction  4/5    Right/Left Knee  Right    Right Knee Flexion  4+/5    Right Knee Extension  4+/5      Palpation   Patella mobility  WFL but may be due to edema      Transfers   Transfers  Independent with all Transfers      Ambulation/Gait   Ambulation/Gait  Yes    Gait Comments  ambulated 50 ft with RW, proper use of RW, then ambulated 25 ft X 2 no AD with fair balance but decreased stance time on Rt leg, with trunk lean.                 Objective measurements completed on examination: See above findings.      [place] Adult PT Treatment/Exercise - 12/18/18 0001      Exercises   Exercises  Knee/Hip      Knee/Hip Exercises: Stretches   Knee: Self-Stretch Limitations  AAROM heelslides 5 sec X 10, standing lunge stretch with Rt foot on 8 inch step 10 sec X 5 reps      Knee/Hip Exercises: Aerobic   Recumbent Bike  5 min for knee ROM,rocking at first then after 3 min was able to slide seat back and perform full revolutions with hip hike      Manual Therapy   Manual therapy comments  PROM Rt knee felxion and extension to tolerance             PT Education - 12/18/18 0856    Education Details  HEP, POC    Person(s) Educated  Patient    Methods  Explanation;Demonstration;Verbal cues;Handout    Comprehension  Verbalized understanding;Need further instruction;Returned demonstration       PT Short  Term Goals - 12/18/18 0903      PT SHORT TERM GOAL #1   Title  be independent in initial HEP    Time  4    Period  Weeks    Status  New    Target Date  01/15/19      PT SHORT TERM GOAL #2   Title  less than one inch edema Rt knee to Lt knee    Baseline  17 inch on Rt, 15 inch on Lt    Time  4    Period  Weeks    Status  New        PT Long Term Goals - 12/18/18  [id number]      PT LONG TERM GOAL #1   Title  be independent in advanced HEP, (target for all LTG 8 weeks 02/11/18)    Time  8    Period  Weeks    Status  New      PT LONG TERM GOAL #2   Title  Report overall less than 2-3/10 pain with usual activity including community ambulation, stairs, standing activity >45 minutes    Status  New      PT LONG TERM GOAL #3   Title  increased knee ROM to WFL (0-120 deg)    Status  New      PT LONG TERM GOAL #4   Title  increased Rt hip/knee strength to 5/5 MMT to improve funciton    Time  8    Period  Weeks    Status  New             Plan - 12/18/18 0858    Clinical Impression Statement  Pt presents with Rt TKA 11/27/18. She is reovering well but does have decreased knee ROM, decreased LE strength, increased knee edema, decreased activity tolerance for standing and walking, and increased pain. She will benefit from skilled PT to address her deficits.    Personal Factors and Comorbidities  Comorbidity 2    Comorbidities  OA, GERD, spinal fusion 2019    Examination-Activity Limitations  Bend;Carry;Squat;Stairs;Stand;Lift;Locomotion Level;Transfers    Examination-Participation Restrictions  Community Activity;Driving;Shop;Laundry;Yard Work;Cleaning;Meal Prep    Stability/Clinical Decision Making  Evolving/Moderate complexity    Clinical Decision Making  Moderate    Rehab Potential  Excellent    PT Frequency  2x / week   2-3   PT Duration  8 weeks    PT Treatment/Interventions  ADLs/Self Care Home Management;Cryotherapy;Electrical Stimulation;Iontophoresis 4mg /ml Dexamethasone;Moist Heat;Ultrasound;Gait training;Stair training;Functional mobility training;Therapeutic activities;Therapeutic exercise;Balance training;Neuromuscular re-education;Manual techniques;Passive range of motion;Dry needling;Joint Manipulations;Vasopneumatic Device;Taping    PT Next Visit Plan  needs knee ROM, gait, LE strength, progress off of RW to [facility] to no AD as able    PT  Home Exercise Plan  she has booklet from HHPT, went in and revised to reduce the ones too easy and add in progression to others, (includes heeslides, lunge knee stretch with foot on step, mini squats, standing marches, standing hip abd, heel toe raises, SLR with quad set)    Consulted and Agree with Plan of Care  Patient       Patient will benefit from skilled therapeutic intervention in order to improve the following deficits and impairments:  Abnormal gait, Decreased activity tolerance, Decreased balance, Decreased mobility, Decreased endurance, Decreased range of motion, Decreased strength, Increased edema, Difficulty walking, Hypermobility, Impaired flexibility, Other (comment), Pain  Visit Diagnosis: Acute pain of right knee  Stiffness of right knee, not elsewhere classified  Muscle weakness (generalized)  Other abnormalities of gait and mobility  Localized edema     Problem List Patient Active Problem List   Diagnosis Date Noted  . Arthritis of right knee 11/27/2018  . Unilateral primary osteoarthritis, right knee 07/05/2017    Class: Chronic  . Anemia due to blood loss 07/05/2017    Class: Acute  . Spinal stenosis, lumbar region with neurogenic claudication   . Spondylolisthesis, lumbar region   . S/P lumbar spinal fusion 07/03/2017  . Mixed conductive and sensorineural hearing loss of left ear with restricted hearing of right ear 12/12/2016  . Acute laryngitis 03/10/2016  . Laryngopharyngeal reflux (LPR) 03/10/2016  . Tympanic membrane perforation, right 03/10/2016    Silvestre Mesi 12/18/2018, 9:14 AM  [facility] [address] [place], [place], [address] Phone: [phone]   Fax:  [phone]  Name: [name] MRN: [id number] Date of Birth: [birth year]

## 2018-12-19 ENCOUNTER — Encounter: Payer: Medicare Other | Admitting: Physical Therapy

## 2018-12-20 ENCOUNTER — Encounter: Payer: Self-pay | Admitting: Physical Therapy

## 2018-12-20 ENCOUNTER — Other Ambulatory Visit: Payer: Self-pay

## 2018-12-20 ENCOUNTER — Ambulatory Visit (INDEPENDENT_AMBULATORY_CARE_PROVIDER_SITE_OTHER): Payer: Medicare Other | Admitting: Physical Therapy

## 2018-12-20 DIAGNOSIS — R600 Localized edema: Secondary | ICD-10-CM | POA: Diagnosis not present

## 2018-12-20 DIAGNOSIS — M6281 Muscle weakness (generalized): Secondary | ICD-10-CM | POA: Diagnosis not present

## 2018-12-20 DIAGNOSIS — M25661 Stiffness of right knee, not elsewhere classified: Secondary | ICD-10-CM

## 2018-12-20 DIAGNOSIS — R2689 Other abnormalities of gait and mobility: Secondary | ICD-10-CM | POA: Diagnosis not present

## 2018-12-20 DIAGNOSIS — M25561 Pain in right knee: Secondary | ICD-10-CM

## 2018-12-20 NOTE — Therapy (Signed)
[place], [place], [address] Phone: [phone]   Fax:  [phone]  Physical Therapy Treatment  Patient Details  Name: [name] MRN: [id number] Date of Birth: 1950/07/13 Referring Provider (PT): Marlou Sa Tonna Corner, MD   Encounter Date: 12/20/2018  PT End of Session - 12/20/18 1525    Visit Number  2    Number of Visits  18    Date for PT Re-Evaluation  02/12/19    Authorization Type  MCR/BCBS, progress note every 10    PT Start Time  1448    PT Stop Time  1532    PT Time Calculation (min)  44 min    Activity Tolerance  Patient tolerated treatment well    Behavior During Therapy  [facility] for tasks assessed/performed       Past Medical History:  Diagnosis Date  . Arthritis   . Carpal tunnel syndrome   . GERD (gastroesophageal reflux disease)   . Hard of hearing    left side  . Hypothyroid   . Pneumonia   . Varicose vein of leg     Past Surgical History:  Procedure Laterality Date  . ABDOMINAL HYSTERECTOMY    . ABDOMINAL HYSTERECTOMY    . APPENDECTOMY    . BACK SURGERY    . CARPAL TUNNEL RELEASE Left 06/26/2018   Procedure: CARPAL TUNNEL RELEASE;  Surgeon: Daryll Brod, MD;  Location: [place];  Service: Orthopedics;  Laterality: Left;  . cholesteatoma Left   . COLON SURGERY     part of intestine removed  . FOOT SURGERY Bilateral   . IMPLANTATION BONE ANCHORED HEARING AID Left 01/2017  . INJECTION KNEE Right 07/03/2017   Procedure: KNEE INJECTION;  Surgeon: Jessy Oto, MD;  Location: [place];  Service: Orthopedics;  Laterality: Right;  . KNEE ARTHROSCOPY Right   . SPINAL FUSION     L4 and L5 for spinal stenosis , 06/2017  . TONSILLECTOMY    . TOTAL KNEE ARTHROPLASTY Right 11/27/2018  . TOTAL KNEE ARTHROPLASTY Right 11/27/2018   Procedure: RIGHT TOTAL KNEE ARTHROPLASTY;  Surgeon: Meredith Pel, MD;  Location: [place];  Service: Orthopedics;  Laterality: Right;    There were  no vitals filed for this visit.  Subjective Assessment - 12/20/18 1452    Subjective  doing well; no pain arriving to PT today.    Pertinent History  PMH: OA of back and knee, S/P lumbar fusion 2019    Patient Stated Goals  get back to normal, walk without AD    Currently in Pain?  Yes    Pain Score  0-No pain   up to 7-8/10   Pain Location  Knee    Pain Orientation  Right    Pain Descriptors / Indicators  Aching    Pain Type  Surgical pain;Acute pain    Pain Onset  1 to 4 weeks ago    Pain Frequency  Intermittent    Aggravating Factors   pain in morning, prolonged walking or standing    Pain Relieving Factors  pain meds, moving around                       [facility] Adult PT Treatment/Exercise - 12/20/18 1454      Knee/Hip Exercises: Aerobic   Recumbent Bike  8 min partial revolutions; seat 6      Knee/Hip Exercises: Supine   Quad Sets  Left;15 reps   mod  to max cues-improved quad control   Short Arc Quad Sets  Right;20 reps   5 sec hold   Heel Slides  AAROM;Right;10 reps   with strap   Straight Leg Raises  Left;10 reps    Other Supine Knee/Hip Exercises  PT assisted ball roll/hamstring curl for knee flexion      Modalities   Modalities  Vasopneumatic      Vasopneumatic   Number Minutes Vasopneumatic   10 minutes    Vasopnuematic Location   Knee    Vasopneumatic Pressure  Medium    Vasopneumatic Temperature   34      Manual Therapy   Manual Therapy  Passive ROM    Passive ROM  Rt knee into flexion; gentle patellar mobs               PT Short Term Goals - 12/18/18 0903      PT SHORT TERM GOAL #1   Title  be independent in initial HEP    Time  4    Period  Weeks    Status  New    Target Date  01/15/19      PT SHORT TERM GOAL #2   Title  less than one inch edema Rt knee to Lt knee    Baseline  17 inch on Rt, 15 inch on Lt    Time  4    Period  Weeks    Status  New        PT Long Term Goals - 12/18/18 [id number]      PT LONG TERM GOAL  #1   Title  be independent in advanced HEP, (target for all LTG 8 weeks 02/11/18)    Time  8    Period  Weeks    Status  New      PT LONG TERM GOAL #2   Title  Report overall less than 2-3/10 pain with usual activity including community ambulation, stairs, standing activity >45 minutes    Status  New      PT LONG TERM GOAL #3   Title  increased knee ROM to WFL (0-120 deg)    Status  New      PT LONG TERM GOAL #4   Title  increased Rt hip/knee strength to 5/5 MMT to improve funciton    Time  8    Period  Weeks    Status  New            Plan - 12/20/18 1526    Clinical Impression Statement  Pt tolerated session well today, and demonstrates great extension with improving flexion.  Improved quad contraction today with cues and focus on quad control.  Will continue to benefit from PT to maximize function.    Personal Factors and Comorbidities  Comorbidity 2    Comorbidities  OA, GERD, spinal fusion 2019    Examination-Activity Limitations  Bend;Carry;Squat;Stairs;Stand;Lift;Locomotion Level;Transfers    Examination-Participation Restrictions  Community Activity;Driving;Shop;Laundry;Yard Work;Cleaning;Meal Prep    Stability/Clinical Decision Making  Evolving/Moderate complexity    Rehab Potential  Excellent    PT Frequency  2x / week   2-3   PT Duration  8 weeks    PT Treatment/Interventions  ADLs/Self Care Home Management;Cryotherapy;Electrical Stimulation;Iontophoresis 4mg /ml Dexamethasone;Moist Heat;Ultrasound;Gait training;Stair training;Functional mobility training;Therapeutic activities;Therapeutic exercise;Balance training;Neuromuscular re-education;Manual techniques;Passive range of motion;Dry needling;Joint Manipulations;Vasopneumatic Device;Taping    PT Next Visit Plan  needs knee ROM, gait, LE strength, gait training with SPC/no AD    PT Home Exercise Plan  she has booklet from [place], went in and revised to reduce the ones too easy and add in progression to others,  (includes heeslides, lunge knee stretch with foot on step, mini squats, standing marches, standing hip abd, heel toe raises, SLR with quad set)    Consulted and Agree with Plan of Care  Patient       Patient will benefit from skilled therapeutic intervention in order to improve the following deficits and impairments:  Abnormal gait, Decreased activity tolerance, Decreased balance, Decreased mobility, Decreased endurance, Decreased range of motion, Decreased strength, Increased edema, Difficulty walking, Hypermobility, Impaired flexibility, Other (comment), Pain  Visit Diagnosis: Acute pain of right knee  Stiffness of right knee, not elsewhere classified  Muscle weakness (generalized)  Other abnormalities of gait and mobility  Localized edema     Problem List Patient Active Problem List   Diagnosis Date Noted  . Arthritis of right knee 11/27/2018  . Unilateral primary osteoarthritis, right knee 07/05/2017    Class: Chronic  . Anemia due to blood loss 07/05/2017    Class: Acute  . Spinal stenosis, lumbar region with neurogenic claudication   . Spondylolisthesis, lumbar region   . S/P lumbar spinal fusion 07/03/2017  . Mixed conductive and sensorineural hearing loss of left ear with restricted hearing of right ear 12/12/2016  . Acute laryngitis 03/10/2016  . Laryngopharyngeal reflux (LPR) 03/10/2016  . Tympanic membrane perforation, right 03/10/2016      Laureen Abrahams, PT, DPT 12/20/18 3:28 PM     [facility] Physical Therapy [address] [place], [place], [address] Phone: [phone]   Fax:  [phone]  Name: [name] MRN: [id number] Date of Birth: 07/30/1950

## 2018-12-27 ENCOUNTER — Telehealth: Payer: Self-pay | Admitting: *Deleted

## 2018-12-27 NOTE — Telephone Encounter (Signed)
Attempted call to patient for 30 day call. Left VM requesting call back.

## 2018-12-28 ENCOUNTER — Telehealth: Payer: Self-pay | Admitting: *Deleted

## 2018-12-28 ENCOUNTER — Encounter

## 2018-12-28 NOTE — Telephone Encounter (Signed)
30 day Ortho bundle call completed.

## 2018-12-28 NOTE — Care Plan (Signed)
RNCM call to patient for 30 day check and survey. Patient verbalized she was doing well. Swelling has decreased and she uses ice after all exercises daily. Has not had OPPT this week due to scheduling issues, but is scheduled next week through the holidays. She is not using her walker or cane around the home and is taking pain medication very sparingly (typically at night only). Reviewed Patient satisfaction survey provided by TOM/THN in place of Epic 10 question survey.  1. Before surgery, I was provided sufficient education regarding my surgery and the bundle program. Patient answer: Agree 2. I was satisfied with the care provided by the nurse at the facility where my surgery was performed. Patient answer:Disagree- issues with hospital staff (not answering call bell timely, medication request then not receiving for more than an hour, etc.) 3. Following surgery, I received sufficient postoperative care instructions.  Patient answer: Strongly agree 4. I would recommend my surgeon and this bundle program to others.  Patient answer:Strongly agree Other comments:

## 2019-01-01 ENCOUNTER — Encounter: Payer: Medicare Other | Admitting: Physical Therapy

## 2019-01-03 ENCOUNTER — Encounter: Payer: Medicare Other | Admitting: Physical Therapy

## 2019-01-04 ENCOUNTER — Encounter: Payer: Medicare Other | Admitting: Physical Therapy

## 2019-01-07 ENCOUNTER — Encounter: Payer: Medicare Other | Admitting: Physical Therapy

## 2019-01-09 ENCOUNTER — Other Ambulatory Visit: Payer: Self-pay

## 2019-01-09 ENCOUNTER — Ambulatory Visit (INDEPENDENT_AMBULATORY_CARE_PROVIDER_SITE_OTHER): Payer: Medicare Other | Admitting: Physical Therapy

## 2019-01-09 DIAGNOSIS — M25661 Stiffness of right knee, not elsewhere classified: Secondary | ICD-10-CM

## 2019-01-09 DIAGNOSIS — R600 Localized edema: Secondary | ICD-10-CM | POA: Diagnosis not present

## 2019-01-09 DIAGNOSIS — R2689 Other abnormalities of gait and mobility: Secondary | ICD-10-CM | POA: Diagnosis not present

## 2019-01-09 DIAGNOSIS — M6281 Muscle weakness (generalized): Secondary | ICD-10-CM | POA: Diagnosis not present

## 2019-01-09 DIAGNOSIS — M25561 Pain in right knee: Secondary | ICD-10-CM | POA: Diagnosis not present

## 2019-01-09 NOTE — Therapy (Addendum)
[place], [place], [address] Phone: [phone]   Fax:  [phone]  Physical Therapy Treatment/Discharge Summary  Patient Details  Name: [name] MRN: [id number] Date of Birth: 1950/12/23 Referring Provider (PT): Marlou Sa Tonna Corner, MD   Encounter Date: 01/09/2019  PT End of Session - 01/09/19 1319    Visit Number  3    Number of Visits  18    Date for PT Re-Evaluation  02/12/19    Authorization Type  MCR/BCBS, progress note every 10    PT Start Time  1315    PT Stop Time  1355    PT Time Calculation (min)  40 min    Activity Tolerance  Patient tolerated treatment well    Behavior During Therapy  [facility] for tasks assessed/performed       Past Medical History:  Diagnosis Date  . Arthritis   . Carpal tunnel syndrome   . GERD (gastroesophageal reflux disease)   . Hard of hearing    left side  . Hypothyroid   . Pneumonia   . Varicose vein of leg     Past Surgical History:  Procedure Laterality Date  . ABDOMINAL HYSTERECTOMY    . ABDOMINAL HYSTERECTOMY    . APPENDECTOMY    . BACK SURGERY    . CARPAL TUNNEL RELEASE Left 06/26/2018   Procedure: CARPAL TUNNEL RELEASE;  Surgeon: Daryll Brod, MD;  Location: [place];  Service: Orthopedics;  Laterality: Left;  . cholesteatoma Left   . COLON SURGERY     part of intestine removed  . FOOT SURGERY Bilateral   . IMPLANTATION BONE ANCHORED HEARING AID Left 01/2017  . INJECTION KNEE Right 07/03/2017   Procedure: KNEE INJECTION;  Surgeon: Jessy Oto, MD;  Location: [place];  Service: Orthopedics;  Laterality: Right;  . KNEE ARTHROSCOPY Right   . SPINAL FUSION     L4 and L5 for spinal stenosis , 06/2017  . TONSILLECTOMY    . TOTAL KNEE ARTHROPLASTY Right 11/27/2018  . TOTAL KNEE ARTHROPLASTY Right 11/27/2018   Procedure: RIGHT TOTAL KNEE ARTHROPLASTY;  Surgeon: Meredith Pel, MD;  Location: [place];  Service: Orthopedics;  Laterality:  Right;    There were no vitals filed for this visit.  Subjective Assessment - 01/09/19 1318    Subjective  doing well; no pain arriving to PT today. Able to now walk down her stairs reciprocally    Pertinent History  PMH: OA of back and knee, S/P lumbar fusion 2019    Currently in Pain?  No/denies         [facility] PT Assessment - 01/09/19 0001      Assessment   Medical Diagnosis  Rt TKA    Referring Provider (PT)  Marlou Sa Tonna Corner, MD    Onset Date/Surgical Date  11/27/18    Next MD Visit  01/23/19      AROM   Right Knee Extension  0    Right Knee Flexion  103   AAROM with strap     PROM   Right Knee Extension  0    Right Knee Flexion  110                   [facility] Adult PT Treatment/Exercise - 01/09/19 0001      Knee/Hip Exercises: Stretches   Quad Stretch  Right;3 reps;30 seconds    Quad Stretch Limitations  supine with strap, leg off EOB    Knee:  Self-Stretch Limitations  AAROM heelslides 10 sec X 5      Knee/Hip Exercises: Aerobic   Recumbent Bike  5 min full revolutions seat 6      Knee/Hip Exercises: Standing   Knee Flexion  Right;20 reps    Knee Flexion Limitations  3 lbs    Hip Flexion  Right;20 reps    Hip Flexion Limitations  3 lbs    Hip Abduction  Right;20 reps    Abduction Limitations  3 lbs    Lateral Step Up  Right;10 reps;Hand Hold: 2;Step Height: 8"    Forward Step Up  Right;10 reps;Hand Hold: 2;Step Height: 8"    SLS  10 sec X 5 reps      Knee/Hip Exercises: Seated   Long Arc Quad  Right;20 reps    Long Arc Quad Weight  3 lbs.    Sit to Sand  10 reps;without UE support      Modalities   Modalities  --   declined     Manual Therapy   Manual therapy comments  Rt knee PROM flexion, STM/IASTM and scar masssage to anterior knee, KT tape for scar mobilization along incision.               PT Short Term Goals - 12/18/18 0903      PT SHORT TERM GOAL #1   Title  be independent in initial HEP    Time  4    Period  Weeks     Status  New    Target Date  01/15/19      PT SHORT TERM GOAL #2   Title  less than one inch edema Rt knee to Lt knee    Baseline  17 inch on Rt, 15 inch on Lt    Time  4    Period  Weeks    Status  New        PT Long Term Goals - 12/18/18 0981      PT LONG TERM GOAL #1   Title  be independent in advanced HEP, (target for all LTG 8 weeks 02/11/18)    Time  8    Period  Weeks    Status  New      PT LONG TERM GOAL #2   Title  Report overall less than 2-3/10 pain with usual activity including community ambulation, stairs, standing activity >45 minutes    Status  New      PT LONG TERM GOAL #3   Title  increased knee ROM to WFL (0-120 deg)    Status  New      PT LONG TERM GOAL #4   Title  increased Rt hip/knee strength to 5/5 MMT to improve funciton    Time  8    Period  Weeks    Status  New            Plan - 01/09/19 1414    Clinical Impression Statement  She is making great progress with knee ROM, strength, and gait. See updated measurements for ROM. Pt will continue to progress as able.    Personal Factors and Comorbidities  Comorbidity 2    Comorbidities  OA, GERD, spinal fusion 2019    Examination-Activity Limitations  Bend;Carry;Squat;Stairs;Stand;Lift;Locomotion Level;Transfers    Examination-Participation Restrictions  Community Activity;Driving;Shop;Laundry;Yard Work;Cleaning;Meal Prep    Stability/Clinical Decision Making  Evolving/Moderate complexity    Rehab Potential  Excellent    PT Frequency  2x / week   2-3  PT Duration  8 weeks    PT Treatment/Interventions  ADLs/Self Care Home Management;Cryotherapy;Electrical Stimulation;Iontophoresis '4mg'$ /ml Dexamethasone;Moist Heat;Ultrasound;Gait training;Stair training;Functional mobility training;Therapeutic activities;Therapeutic exercise;Balance training;Neuromuscular re-education;Manual techniques;Passive range of motion;Dry needling;Joint Manipulations;Vasopneumatic Device;Taping    PT Next Visit Plan   needs knee ROM, gait, LE strength, gait training with SPC/no AD    PT Home Exercise Plan  she has booklet from [facility], went in and revised to reduce the ones too easy and add in progression to others, (includes heeslides, lunge knee stretch with foot on step, mini squats, standing marches, standing hip abd, heel toe raises, SLR with quad set)    Consulted and Agree with Plan of Care  Patient       Patient will benefit from skilled therapeutic intervention in order to improve the following deficits and impairments:  Abnormal gait, Decreased activity tolerance, Decreased balance, Decreased mobility, Decreased endurance, Decreased range of motion, Decreased strength, Increased edema, Difficulty walking, Hypermobility, Impaired flexibility, Other (comment), Pain  Visit Diagnosis: Acute pain of right knee  Stiffness of right knee, not elsewhere classified  Muscle weakness (generalized)  Other abnormalities of gait and mobility  Localized edema     Problem List Patient Active Problem List   Diagnosis Date Noted  . Arthritis of right knee 11/27/2018  . Unilateral primary osteoarthritis, right knee 07/05/2017    Class: Chronic  . Anemia due to blood loss 07/05/2017    Class: Acute  . Spinal stenosis, lumbar region with neurogenic claudication   . Spondylolisthesis, lumbar region   . S/P lumbar spinal fusion 07/03/2017  . Mixed conductive and sensorineural hearing loss of left ear with restricted hearing of right ear 12/12/2016  . Acute laryngitis 03/10/2016  . Laryngopharyngeal reflux (LPR) 03/10/2016  . Tympanic membrane perforation, right 03/10/2016    Silvestre Mesi 01/09/2019, 2:26 PM  [facility] Physical Therapy [address] [place], [place], [address] Phone: [phone]   Fax:  [phone]  Name: [name] MRN: [id number] Date of Birth: Jun 02, 1950     PHYSICAL THERAPY DISCHARGE SUMMARY  Visits from Start of Care: 3  Current  functional level related to goals / functional outcomes: See above   Remaining deficits: See above; pt cx remaining appts   Education / Equipment: HEP  Plan: Patient agrees to discharge.  Patient goals were not met. Patient is being discharged due to being pleased with the current functional level.  ?????     Laureen Abrahams, PT, DPT 03/18/19 9:53 AM  [facility] Physical Therapy [address] [place], [place], [address] Phone: [phone]   Fax:  [phone]

## 2019-01-14 ENCOUNTER — Encounter: Payer: Medicare Other | Admitting: Physical Therapy

## 2019-01-16 ENCOUNTER — Encounter: Payer: Medicare Other | Admitting: Physical Therapy

## 2019-01-23 ENCOUNTER — Ambulatory Visit (INDEPENDENT_AMBULATORY_CARE_PROVIDER_SITE_OTHER): Payer: Medicare Other | Admitting: Orthopedic Surgery

## 2019-01-23 ENCOUNTER — Encounter: Payer: Self-pay | Admitting: Orthopedic Surgery

## 2019-01-23 ENCOUNTER — Other Ambulatory Visit: Payer: Self-pay

## 2019-01-23 DIAGNOSIS — Z96651 Presence of right artificial knee joint: Secondary | ICD-10-CM

## 2019-01-23 NOTE — Progress Notes (Signed)
Post-Op Visit Note   Patient: [name]           Date of Birth: 29-Sep-1950           MRN: [id number] Visit Date: 01/23/2019 PCP: Leeroy Cha, MD   Assessment & Plan:  Chief Complaint:  Chief Complaint  Patient presents with  . Right Knee - Follow-up   Visit Diagnoses:  1. S/P total knee arthroplasty, right     Plan: [name] is a patient with right total knee replacement.  She is now about 8 weeks out.  Doing well.  On exam she has excellent flexion and full extension.  She is worried a little bit about limping.  I think that therapy could help her get the leg stronger.  She is doing well going up and down stairs.  Come back in 8 weeks for final check.  Continue with physical therapy but also change up to home exercise program in the near future  Follow-Up Instructions: Return in about 8 weeks (around 03/20/2019).   Orders:  No orders of the defined types were placed in this encounter.  No orders of the defined types were placed in this encounter.   Imaging: No results found.  PMFS History: Patient Active Problem List   Diagnosis Date Noted  . Arthritis of right knee 11/27/2018  . Unilateral primary osteoarthritis, right knee 07/05/2017    Class: Chronic  . Anemia due to blood loss 07/05/2017    Class: Acute  . Spinal stenosis, lumbar region with neurogenic claudication   . Spondylolisthesis, lumbar region   . S/P lumbar spinal fusion 07/03/2017  . Mixed conductive and sensorineural hearing loss of left ear with restricted hearing of right ear 12/12/2016  . Acute laryngitis 03/10/2016  . Laryngopharyngeal reflux (LPR) 03/10/2016  . Tympanic membrane perforation, right 03/10/2016   Past Medical History:  Diagnosis Date  . Arthritis   . Carpal tunnel syndrome   . GERD (gastroesophageal reflux disease)   . Hard of hearing    left side  . Hypothyroid   . Pneumonia   . Varicose vein of leg     History reviewed. No pertinent family history.    Past Surgical History:  Procedure Laterality Date  . ABDOMINAL HYSTERECTOMY    . ABDOMINAL HYSTERECTOMY    . APPENDECTOMY    . BACK SURGERY    . CARPAL TUNNEL RELEASE Left 06/26/2018   Procedure: CARPAL TUNNEL RELEASE;  Surgeon: Daryll Brod, MD;  Location: [place];  Service: Orthopedics;  Laterality: Left;  . cholesteatoma Left   . COLON SURGERY     part of intestine removed  . FOOT SURGERY Bilateral   . IMPLANTATION BONE ANCHORED HEARING AID Left 01/2017  . INJECTION KNEE Right 07/03/2017   Procedure: KNEE INJECTION;  Surgeon: Jessy Oto, MD;  Location: [place];  Service: Orthopedics;  Laterality: Right;  . KNEE ARTHROSCOPY Right   . SPINAL FUSION     L4 and L5 for spinal stenosis , 06/2017  . TONSILLECTOMY    . TOTAL KNEE ARTHROPLASTY Right 11/27/2018  . TOTAL KNEE ARTHROPLASTY Right 11/27/2018   Procedure: RIGHT TOTAL KNEE ARTHROPLASTY;  Surgeon: Meredith Pel, MD;  Location: [place];  Service: Orthopedics;  Laterality: Right;   Social History   Occupational History  . Not on file  Tobacco Use  . Smoking status: Never Smoker  . Smokeless tobacco: Never Used  Substance and Sexual Activity  . Alcohol use: Yes  Alcohol/week: 1.0 standard drinks    Types: 1 Glasses of wine per week    Comment:  drinks wine daily with dinner  . Drug use: Never  . Sexual activity: Yes

## 2019-01-30 ENCOUNTER — Encounter: Payer: Medicare Other | Admitting: Physical Therapy

## 2019-02-11 DIAGNOSIS — M180 Bilateral primary osteoarthritis of first carpometacarpal joints: Secondary | ICD-10-CM | POA: Diagnosis not present

## 2019-02-11 DIAGNOSIS — G5601 Carpal tunnel syndrome, right upper limb: Secondary | ICD-10-CM | POA: Diagnosis not present

## 2019-02-12 ENCOUNTER — Other Ambulatory Visit: Payer: Self-pay | Admitting: Orthopedic Surgery

## 2019-02-13 ENCOUNTER — Ambulatory Visit: Payer: Medicare Other

## 2019-02-19 ENCOUNTER — Other Ambulatory Visit: Payer: Self-pay

## 2019-02-19 ENCOUNTER — Encounter (HOSPITAL_BASED_OUTPATIENT_CLINIC_OR_DEPARTMENT_OTHER): Payer: Self-pay | Admitting: Orthopedic Surgery

## 2019-02-22 ENCOUNTER — Other Ambulatory Visit (HOSPITAL_COMMUNITY)
Admission: RE | Admit: 2019-02-22 | Discharge: 2019-02-22 | Disposition: A | Discharge status: Home / Self Care | Payer: Medicare Other | Source: Ambulatory Visit | Attending: Orthopedic Surgery | Admitting: Orthopedic Surgery

## 2019-02-22 ENCOUNTER — Ambulatory Visit: Payer: Medicare Other

## 2019-02-22 DIAGNOSIS — Z01812 Encounter for preprocedural laboratory examination: Secondary | ICD-10-CM | POA: Diagnosis not present

## 2019-02-22 DIAGNOSIS — Z20822 Contact with and (suspected) exposure to covid-19: Secondary | ICD-10-CM | POA: Insufficient documentation

## 2019-02-22 LAB — SARS CORONAVIRUS 2 (TAT 6-24 HRS): SARS Coronavirus 2: NEGATIVE

## 2019-02-22 NOTE — Progress Notes (Signed)

## 2019-02-26 ENCOUNTER — Encounter (HOSPITAL_COMMUNITY): Payer: Self-pay | Admitting: Anesthesiology

## 2019-02-26 ENCOUNTER — Ambulatory Visit (HOSPITAL_BASED_OUTPATIENT_CLINIC_OR_DEPARTMENT_OTHER)
Admission: RE | Admit: 2019-02-26 | Discharge: 2019-02-26 | Disposition: A | Discharge status: Home / Self Care | Payer: Medicare Other | Attending: Orthopedic Surgery | Admitting: Orthopedic Surgery

## 2019-02-26 ENCOUNTER — Encounter (HOSPITAL_BASED_OUTPATIENT_CLINIC_OR_DEPARTMENT_OTHER): Payer: Self-pay | Admitting: Orthopedic Surgery

## 2019-02-26 ENCOUNTER — Other Ambulatory Visit: Payer: Self-pay

## 2019-02-26 ENCOUNTER — Ambulatory Visit (HOSPITAL_BASED_OUTPATIENT_CLINIC_OR_DEPARTMENT_OTHER): Admit: 2019-02-26 | Payer: Medicare Other | Admitting: Orthopedic Surgery

## 2019-02-26 ENCOUNTER — Encounter (HOSPITAL_BASED_OUTPATIENT_CLINIC_OR_DEPARTMENT_OTHER)
Admission: RE | Disposition: A | Discharge status: Home / Self Care | Payer: Self-pay | Source: Home / Self Care | Attending: Orthopedic Surgery

## 2019-02-26 ENCOUNTER — Encounter (HOSPITAL_BASED_OUTPATIENT_CLINIC_OR_DEPARTMENT_OTHER): Payer: Self-pay

## 2019-02-26 DIAGNOSIS — G5601 Carpal tunnel syndrome, right upper limb: Secondary | ICD-10-CM | POA: Diagnosis not present

## 2019-02-26 DIAGNOSIS — Z539 Procedure and treatment not carried out, unspecified reason: Secondary | ICD-10-CM | POA: Insufficient documentation

## 2019-02-26 SURGERY — CARPAL TUNNEL RELEASE
Anesthesia: Regional | Laterality: Right

## 2019-02-26 MED ORDER — CEFAZOLIN SODIUM-DEXTROSE 2-4 GM/100ML-% IV SOLN: 2.0000 g | INTRAVENOUS | Status: DC

## 2019-02-26 MED ORDER — CEFAZOLIN SODIUM-DEXTROSE 2-4 GM/100ML-% IV SOLN
INTRAVENOUS | Status: AC
  Filled 2019-02-26: qty 100

## 2019-02-26 MED ORDER — MIDAZOLAM HCL 2 MG/2ML IJ SOLN: 1.0000 mg | INTRAMUSCULAR | Status: DC | PRN

## 2019-02-26 MED ORDER — CHLORHEXIDINE GLUCONATE 4 % EX LIQD: 60.0000 mL | Freq: Once | CUTANEOUS | Status: DC

## 2019-02-26 MED ORDER — LACTATED RINGERS IV SOLN: INTRAVENOUS | Status: DC

## 2019-02-26 MED ORDER — FENTANYL CITRATE (PF) 100 MCG/2ML IJ SOLN
INTRAMUSCULAR | Status: AC
  Filled 2019-02-26: qty 2

## 2019-02-26 MED ORDER — MIDAZOLAM HCL 2 MG/2ML IJ SOLN
INTRAMUSCULAR | Status: AC
  Filled 2019-02-26: qty 2

## 2019-02-26 MED ORDER — FENTANYL CITRATE (PF) 100 MCG/2ML IJ SOLN: 50.0000 ug | INTRAMUSCULAR | Status: DC | PRN

## 2019-02-26 MED ORDER — ACETAMINOPHEN 500 MG PO TABS: 1000.0000 mg | ORAL_TABLET | Freq: Once | ORAL | Status: DC
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
## 2019-02-26 NOTE — Progress Notes (Signed)
Patient presented to [place] for left carpal tunnel surgery with Dr. Daryll Brod.  Patient, upon observation, in pre op had a laceration to left hand, surgery site.  Dr. Cyndia Diver notified and cancelled procedure.  Patient aware and advised to contact surgeon's office when site healed.  Patient acknowledged receipt.  IV dcd with no signs or symptoms of infection noted.  Patient dressed and left with husband.

## 2019-02-28 ENCOUNTER — Ambulatory Visit: Payer: Medicare Other

## 2019-03-01 ENCOUNTER — Telehealth: Payer: Self-pay | Admitting: *Deleted

## 2019-03-01 NOTE — Telephone Encounter (Signed)
90 day Ortho bundle call and survey completed.

## 2019-03-01 NOTE — Care Plan (Signed)
Ortho bundle 90 day call completed. Patient reports she is doing extremely well and is pleased with results from surgery.

## 2019-03-05 DIAGNOSIS — L820 Inflamed seborrheic keratosis: Secondary | ICD-10-CM | POA: Diagnosis not present

## 2019-03-05 DIAGNOSIS — D485 Neoplasm of uncertain behavior of skin: Secondary | ICD-10-CM | POA: Diagnosis not present

## 2019-03-05 DIAGNOSIS — L301 Dyshidrosis [pompholyx]: Secondary | ICD-10-CM | POA: Diagnosis not present

## 2019-03-06 ENCOUNTER — Ambulatory Visit: Payer: Medicare Other

## 2019-03-14 DIAGNOSIS — H401131 Primary open-angle glaucoma, bilateral, mild stage: Secondary | ICD-10-CM | POA: Diagnosis not present

## 2019-03-19 DIAGNOSIS — L308 Other specified dermatitis: Secondary | ICD-10-CM | POA: Diagnosis not present

## 2019-03-19 DIAGNOSIS — L905 Scar conditions and fibrosis of skin: Secondary | ICD-10-CM | POA: Diagnosis not present

## 2019-03-20 ENCOUNTER — Other Ambulatory Visit: Payer: Self-pay

## 2019-03-20 ENCOUNTER — Ambulatory Visit (INDEPENDENT_AMBULATORY_CARE_PROVIDER_SITE_OTHER): Payer: Medicare Other | Admitting: Orthopedic Surgery

## 2019-03-20 ENCOUNTER — Encounter: Payer: Self-pay | Admitting: Orthopedic Surgery

## 2019-03-20 DIAGNOSIS — Z96651 Presence of right artificial knee joint: Secondary | ICD-10-CM

## 2019-03-22 ENCOUNTER — Encounter: Payer: Self-pay | Admitting: Orthopedic Surgery

## 2019-03-22 NOTE — Progress Notes (Signed)
Post-Op Visit Note   Patient: [name]           Date of Birth: 07-11-1950           MRN: [id number] Visit Date: 03/20/2019 PCP: Leeroy Cha, MD   Assessment & Plan:  Chief Complaint:  Chief Complaint  Patient presents with  . Follow-up   Visit Diagnoses:  1. S/P total knee arthroplasty, right     Plan: Patient is a 69 year old female who presents s/p right total knee arthroplasty on 11/27/2018.  Patient notes that she is doing well with no complaints.  She did have a couple days of soreness when she "pulled it" in early February but this has resolved.  She notes stiffness with cold weather on occasion.  Her numbness has resolved.  She denies any difficulty with stairs or standing up from a seated position.  Denies any fevers, chills.  She is not taking any regular medication for pain.  On exam she has 0 degrees of extension with 105 degrees of flexion.  No significant effusion.  No calf tenderness negative Homans' sign.  She is planning on having a carpal tunnel release later this month by Dr. Fredna Dow.  She does not have any dental work planned at this time.  Follow-up with the office as needed.  Follow-Up Instructions: No follow-ups on file.   Orders:  No orders of the defined types were placed in this encounter.  No orders of the defined types were placed in this encounter.   Imaging: No results found.  PMFS History: Patient Active Problem List   Diagnosis Date Noted  . Arthritis of right knee 11/27/2018  . Unilateral primary osteoarthritis, right knee 07/05/2017    Class: Chronic  . Anemia due to blood loss 07/05/2017    Class: Acute  . Spinal stenosis, lumbar region with neurogenic claudication   . Spondylolisthesis, lumbar region   . S/P lumbar spinal fusion 07/03/2017  . Mixed conductive and sensorineural hearing loss of left ear with restricted hearing of right ear 12/12/2016  . Acute laryngitis 03/10/2016  . Laryngopharyngeal reflux (LPR)  03/10/2016  . Tympanic membrane perforation, right 03/10/2016   Past Medical History:  Diagnosis Date  . Arthritis   . Carpal tunnel syndrome   . GERD (gastroesophageal reflux disease)   . Hard of hearing    left side  . Hypothyroid   . Pneumonia   . Varicose vein of leg     History reviewed. No pertinent family history.  Past Surgical History:  Procedure Laterality Date  . ABDOMINAL HYSTERECTOMY    . ABDOMINAL HYSTERECTOMY    . APPENDECTOMY    . BACK SURGERY    . CARPAL TUNNEL RELEASE Left 06/26/2018   Procedure: CARPAL TUNNEL RELEASE;  Surgeon: Daryll Brod, MD;  Location: [place];  Service: Orthopedics;  Laterality: Left;  . cholesteatoma Left   . COLON SURGERY     part of intestine removed  . FOOT SURGERY Bilateral   . IMPLANTATION BONE ANCHORED HEARING AID Left 01/2017  . INJECTION KNEE Right 07/03/2017   Procedure: KNEE INJECTION;  Surgeon: Jessy Oto, MD;  Location: [place];  Service: Orthopedics;  Laterality: Right;  . KNEE ARTHROSCOPY Right   . SPINAL FUSION     L4 and L5 for spinal stenosis , 06/2017  . TONSILLECTOMY    . TOTAL KNEE ARTHROPLASTY Right 11/27/2018  . TOTAL KNEE ARTHROPLASTY Right 11/27/2018   Procedure: RIGHT TOTAL KNEE ARTHROPLASTY;  Surgeon: Marlou Sa,  Tonna Corner, MD;  Location: [place];  Service: Orthopedics;  Laterality: Right;   Social History   Occupational History  . Not on file  Tobacco Use  . Smoking status: Never Smoker  . Smokeless tobacco: Never Used  Substance and Sexual Activity  . Alcohol use: Yes    Alcohol/week: 1.0 standard drinks    Types: 1 Glasses of wine per week    Comment:  drinks wine daily with dinner  . Drug use: Never  . Sexual activity: Yes

## 2019-03-27 ENCOUNTER — Encounter (HOSPITAL_BASED_OUTPATIENT_CLINIC_OR_DEPARTMENT_OTHER): Payer: Self-pay | Admitting: Orthopedic Surgery

## 2019-03-27 ENCOUNTER — Other Ambulatory Visit: Payer: Self-pay

## 2019-04-01 ENCOUNTER — Other Ambulatory Visit (HOSPITAL_COMMUNITY)
Admission: RE | Admit: 2019-04-01 | Discharge: 2019-04-01 | Disposition: A | Discharge status: Home / Self Care | Payer: Medicare Other | Source: Ambulatory Visit | Attending: Orthopedic Surgery | Admitting: Orthopedic Surgery

## 2019-04-01 ENCOUNTER — Other Ambulatory Visit: Payer: Self-pay | Admitting: Orthopedic Surgery

## 2019-04-01 DIAGNOSIS — Z20822 Contact with and (suspected) exposure to covid-19: Secondary | ICD-10-CM | POA: Diagnosis not present

## 2019-04-01 DIAGNOSIS — Z01812 Encounter for preprocedural laboratory examination: Secondary | ICD-10-CM | POA: Insufficient documentation

## 2019-04-01 LAB — SARS CORONAVIRUS 2 (TAT 6-24 HRS): SARS Coronavirus 2: NEGATIVE

## 2019-04-01 NOTE — Progress Notes (Signed)

## 2019-04-04 ENCOUNTER — Encounter (HOSPITAL_BASED_OUTPATIENT_CLINIC_OR_DEPARTMENT_OTHER)
Admission: RE | Disposition: A | Discharge status: Home / Self Care | Payer: Self-pay | Source: Home / Self Care | Attending: Orthopedic Surgery

## 2019-04-04 ENCOUNTER — Ambulatory Visit (HOSPITAL_BASED_OUTPATIENT_CLINIC_OR_DEPARTMENT_OTHER)
Admission: RE | Admit: 2019-04-04 | Discharge: 2019-04-04 | Disposition: A | Discharge status: Home / Self Care | Payer: Medicare Other | Attending: Orthopedic Surgery | Admitting: Orthopedic Surgery

## 2019-04-04 ENCOUNTER — Other Ambulatory Visit: Payer: Self-pay

## 2019-04-04 ENCOUNTER — Encounter (HOSPITAL_BASED_OUTPATIENT_CLINIC_OR_DEPARTMENT_OTHER): Payer: Self-pay | Admitting: Orthopedic Surgery

## 2019-04-04 ENCOUNTER — Ambulatory Visit (HOSPITAL_BASED_OUTPATIENT_CLINIC_OR_DEPARTMENT_OTHER): Payer: Medicare Other | Admitting: Certified Registered"

## 2019-04-04 DIAGNOSIS — G5601 Carpal tunnel syndrome, right upper limb: Secondary | ICD-10-CM | POA: Diagnosis not present

## 2019-04-04 DIAGNOSIS — H9192 Unspecified hearing loss, left ear: Secondary | ICD-10-CM | POA: Diagnosis not present

## 2019-04-04 DIAGNOSIS — M48062 Spinal stenosis, lumbar region with neurogenic claudication: Secondary | ICD-10-CM | POA: Diagnosis not present

## 2019-04-04 DIAGNOSIS — K219 Gastro-esophageal reflux disease without esophagitis: Secondary | ICD-10-CM | POA: Diagnosis not present

## 2019-04-04 DIAGNOSIS — E039 Hypothyroidism, unspecified: Secondary | ICD-10-CM | POA: Diagnosis not present

## 2019-04-04 DIAGNOSIS — Z9049 Acquired absence of other specified parts of digestive tract: Secondary | ICD-10-CM | POA: Diagnosis not present

## 2019-04-04 DIAGNOSIS — Z96651 Presence of right artificial knee joint: Secondary | ICD-10-CM | POA: Insufficient documentation

## 2019-04-04 DIAGNOSIS — Z9889 Other specified postprocedural states: Secondary | ICD-10-CM | POA: Diagnosis not present

## 2019-04-04 DIAGNOSIS — M1711 Unilateral primary osteoarthritis, right knee: Secondary | ICD-10-CM | POA: Diagnosis not present

## 2019-04-04 DIAGNOSIS — M180 Bilateral primary osteoarthritis of first carpometacarpal joints: Secondary | ICD-10-CM | POA: Diagnosis not present

## 2019-04-04 HISTORY — PX: CARPAL TUNNEL RELEASE: SHX101

## 2019-04-04 SURGERY — CARPAL TUNNEL RELEASE
Anesthesia: Regional | Site: Wrist | Laterality: Right

## 2019-04-04 MED ORDER — FENTANYL CITRATE (PF) 100 MCG/2ML IJ SOLN
50.0000 ug | INTRAMUSCULAR | Status: DC | PRN
  Administered 2019-04-04: 25 ug via INTRAVENOUS
  Administered 2019-04-04: 50 ug via INTRAVENOUS

## 2019-04-04 MED ORDER — CEFAZOLIN SODIUM-DEXTROSE 2-4 GM/100ML-% IV SOLN
2.0000 g | INTRAVENOUS | Status: AC
  Administered 2019-04-04: 2 g via INTRAVENOUS

## 2019-04-04 MED ORDER — BUPIVACAINE HCL (PF) 0.25 % IJ SOLN
INTRAMUSCULAR | Status: DC | PRN
  Administered 2019-04-04: 7 mL

## 2019-04-04 MED ORDER — MIDAZOLAM HCL 2 MG/2ML IJ SOLN
1.0000 mg | INTRAMUSCULAR | Status: DC | PRN
  Administered 2019-04-04 (×2): 1 mg via INTRAVENOUS

## 2019-04-04 MED ORDER — ONDANSETRON HCL 4 MG/2ML IJ SOLN
INTRAMUSCULAR | Status: AC
  Filled 2019-04-04: qty 2

## 2019-04-04 MED ORDER — TRAMADOL HCL 50 MG PO TABS: 50.0000 mg | ORAL_TABLET | Freq: Four times a day (QID) | ORAL | 0 refills | Status: AC | PRN

## 2019-04-04 MED ORDER — ACETAMINOPHEN 500 MG PO TABS
1000.0000 mg | ORAL_TABLET | Freq: Once | ORAL | Status: AC
  Administered 2019-04-04: 1000 mg via ORAL

## 2019-04-04 MED ORDER — CEFAZOLIN SODIUM-DEXTROSE 2-4 GM/100ML-% IV SOLN
INTRAVENOUS | Status: AC
  Filled 2019-04-04: qty 100

## 2019-04-04 MED ORDER — FENTANYL CITRATE (PF) 100 MCG/2ML IJ SOLN: 25.0000 ug | INTRAMUSCULAR | Status: DC | PRN

## 2019-04-04 MED ORDER — CHLORHEXIDINE GLUCONATE 4 % EX LIQD: 60.0000 mL | Freq: Once | CUTANEOUS | Status: DC

## 2019-04-04 MED ORDER — ONDANSETRON HCL 4 MG/2ML IJ SOLN
INTRAMUSCULAR | Status: DC | PRN
  Administered 2019-04-04: 4 mg via INTRAVENOUS

## 2019-04-04 MED ORDER — PROPOFOL 500 MG/50ML IV EMUL
INTRAVENOUS | Status: DC | PRN
  Administered 2019-04-04: 50 ug/kg/min via INTRAVENOUS

## 2019-04-04 MED ORDER — MIDAZOLAM HCL 2 MG/2ML IJ SOLN
INTRAMUSCULAR | Status: AC
  Filled 2019-04-04: qty 2

## 2019-04-04 MED ORDER — ONDANSETRON HCL 4 MG/2ML IJ SOLN: 4.0000 mg | Freq: Once | INTRAMUSCULAR | Status: DC | PRN

## 2019-04-04 MED ORDER — LIDOCAINE HCL (PF) 0.5 % IJ SOLN
INTRAMUSCULAR | Status: DC | PRN
  Administered 2019-04-04: 35 mL via INTRAVENOUS

## 2019-04-04 MED ORDER — FENTANYL CITRATE (PF) 100 MCG/2ML IJ SOLN
INTRAMUSCULAR | Status: AC
  Filled 2019-04-04: qty 2

## 2019-04-04 MED ORDER — LACTATED RINGERS IV SOLN: INTRAVENOUS | Status: DC

## 2019-04-04 MED ORDER — PROPOFOL 500 MG/50ML IV EMUL
INTRAVENOUS | Status: AC
  Filled 2019-04-04: qty 50

## 2019-04-04 MED ORDER — ACETAMINOPHEN 500 MG PO TABS
ORAL_TABLET | ORAL | Status: AC
  Filled 2019-04-04: qty 2

## 2019-04-04 MED ORDER — LIDOCAINE 2% (20 MG/ML) 5 ML SYRINGE
INTRAMUSCULAR | Status: AC
  Filled 2019-04-04: qty 5

## 2019-04-04 MED ORDER — LIDOCAINE HCL (CARDIAC) PF 100 MG/5ML IV SOSY
PREFILLED_SYRINGE | INTRAVENOUS | Status: DC | PRN
  Administered 2019-04-04: 20 mg via INTRAVENOUS

## 2019-04-04 SURGICAL SUPPLY — 38 items
APL PRP STRL LF DISP 70% ISPRP (MISCELLANEOUS) ×1
BLADE SURG 15 STRL LF DISP TIS (BLADE) ×1 IMPLANT
BLADE SURG 15 STRL SS (BLADE) ×3
BNDG CMPR 9X4 STRL LF SNTH (GAUZE/BANDAGES/DRESSINGS)
BNDG COHESIVE 3X5 TAN STRL LF (GAUZE/BANDAGES/DRESSINGS) ×3 IMPLANT
BNDG ESMARK 4X9 LF (GAUZE/BANDAGES/DRESSINGS) IMPLANT
BNDG GAUZE ELAST 4 BULKY (GAUZE/BANDAGES/DRESSINGS) ×3 IMPLANT
CHLORAPREP W/TINT 26 (MISCELLANEOUS) ×3 IMPLANT
CORD BIPOLAR FORCEPS 12FT (ELECTRODE) ×3 IMPLANT
COVER BACK TABLE 60X90IN (DRAPES) ×3 IMPLANT
COVER MAYO STAND STRL (DRAPES) ×3 IMPLANT
COVER WAND RF STERILE (DRAPES) IMPLANT
CUFF TOURN SGL QUICK 18X4 (TOURNIQUET CUFF) ×3 IMPLANT
DRAPE EXTREMITY T 121X128X90 (DISPOSABLE) ×3 IMPLANT
DRAPE SURG 17X23 STRL (DRAPES) ×3 IMPLANT
DRSG PAD ABDOMINAL 8X10 ST (GAUZE/BANDAGES/DRESSINGS) ×3 IMPLANT
GAUZE SPONGE 4X4 12PLY STRL (GAUZE/BANDAGES/DRESSINGS) ×3 IMPLANT
GAUZE XEROFORM 1X8 LF (GAUZE/BANDAGES/DRESSINGS) ×3 IMPLANT
GLOVE BIOGEL PI IND STRL 7.0 (GLOVE) IMPLANT
GLOVE BIOGEL PI IND STRL 8.5 (GLOVE) ×1 IMPLANT
GLOVE BIOGEL PI INDICATOR 7.0 (GLOVE) ×2
GLOVE BIOGEL PI INDICATOR 8.5 (GLOVE) ×2
GLOVE ECLIPSE 6.5 STRL STRAW (GLOVE) ×2 IMPLANT
GLOVE SURG ORTHO 8.0 STRL STRW (GLOVE) ×3 IMPLANT
GOWN STRL REUS W/ TWL LRG LVL3 (GOWN DISPOSABLE) ×1 IMPLANT
GOWN STRL REUS W/TWL LRG LVL3 (GOWN DISPOSABLE) ×3
GOWN STRL REUS W/TWL XL LVL3 (GOWN DISPOSABLE) ×3 IMPLANT
NDL PRECISIONGLIDE 27X1.5 (NEEDLE) IMPLANT
NEEDLE PRECISIONGLIDE 27X1.5 (NEEDLE) ×3 IMPLANT
NS IRRIG 1000ML POUR BTL (IV SOLUTION) ×3 IMPLANT
PACK BASIN DAY SURGERY FS (CUSTOM PROCEDURE TRAY) ×3 IMPLANT
STOCKINETTE 4X48 STRL (DRAPES) ×3 IMPLANT
SUT ETHILON 4 0 PS 2 18 (SUTURE) ×3 IMPLANT
SUT VICRYL 4-0 PS2 18IN ABS (SUTURE) IMPLANT
SYR BULB 3OZ (MISCELLANEOUS) ×3 IMPLANT
SYR CONTROL 10ML LL (SYRINGE) ×2 IMPLANT
TOWEL GREEN STERILE FF (TOWEL DISPOSABLE) ×3 IMPLANT
UNDERPAD 30X36 HEAVY ABSORB (UNDERPADS AND DIAPERS) ×3 IMPLANT

## 2019-04-04 NOTE — Anesthesia Preprocedure Evaluation (Signed)
Anesthesia Evaluation  Patient identified by MRN, date of birth, ID band Patient awake    Reviewed: Allergy & Precautions, NPO status , Patient's Chart, lab work & pertinent test results  Airway Mallampati: II  TM Distance: >3 FB Neck ROM: Full    Dental  (+) Teeth Intact, Dental Advisory Given, Chipped   Pulmonary neg pulmonary ROS,    Pulmonary exam normal breath sounds clear to auscultation       Cardiovascular negative cardio ROS Normal cardiovascular exam Rhythm:Regular Rate:Normal     Neuro/Psych negative neurological ROS     GI/Hepatic Neg liver ROS, GERD  Medicated,  Endo/Other  Hypothyroidism   Renal/GU negative Renal ROS     Musculoskeletal  (+) Arthritis ,   Abdominal   Peds  Hematology negative hematology ROS (+)   Anesthesia Other Findings Day of surgery medications reviewed with the patient.  Reproductive/Obstetrics                             Anesthesia Physical Anesthesia Plan  ASA: II  Anesthesia Plan: Bier Block and Bier Block-LIDOCAINE ONLY   Post-op Pain Management:    Induction:   PONV Risk Score and Plan: 2 and Propofol infusion and Treatment may vary due to age or medical condition  Airway Management Planned: Natural Airway and Nasal Cannula  Additional Equipment:   Intra-op Plan:   Post-operative Plan:   Informed Consent: I have reviewed the patients History and Physical, chart, labs and discussed the procedure including the risks, benefits and alternatives for the proposed anesthesia with the patient or authorized representative who has indicated his/her understanding and acceptance.     Dental advisory given  Plan Discussed with: CRNA  Anesthesia Plan Comments:         Anesthesia Quick Evaluation

## 2019-04-04 NOTE — Discharge Instructions (Addendum)
[facility] Center Instructions Hand Surgery  Wound Care: Keep your hand elevated above the level of your heart.  Do not allow it to dangle by your side.  Keep the dressing dry and do not remove it unless your doctor advises you to do so.  He will usually change it at the time of your post-op visit.  Moving your fingers is advised to stimulate circulation but will depend on the site of your surgery.  If you have a splint applied, your doctor will advise you regarding movement.  Activity: Do not drive or operate machinery today.  Rest today and then you may return to your normal activity and work as indicated by your physician.  Diet:  Drink liquids today or eat a light diet.  You may resume a regular diet tomorrow.    General expectations: Pain for two to three days. Fingers may become slightly swollen.  Call your doctor if any of the following occur: Severe pain not relieved by pain medication. Elevated temperature. Dressing soaked with blood. Inability to move fingers. White or bluish color to fingers.  No Tylenol until 3pm if needed   Post Anesthesia Home Care Instructions  Activity: Get plenty of rest for the remainder of the day. A responsible individual must stay with you for 24 hours following the procedure.  For the next 24 hours, DO NOT: -Drive a car -Paediatric nurse -Drink alcoholic beverages -Take any medication unless instructed by your physician -Make any legal decisions or sign important papers.  Meals: Start with liquid foods such as gelatin or soup. Progress to regular foods as tolerated. Avoid greasy, spicy, heavy foods. If nausea and/or vomiting occur, drink only clear liquids until the nausea and/or vomiting subsides. Call your physician if vomiting continues.  Special Instructions/Symptoms: Your throat may feel dry or sore from the anesthesia or the breathing tube placed in your throat during surgery. If this causes discomfort, gargle with warm salt water. The  discomfort should disappear within 24 hours.  If you had a scopolamine patch placed behind your ear for the management of post- operative nausea and/or vomiting:  1. The medication in the patch is effective for 72 hours, after which it should be removed.  Wrap patch in a tissue and discard in the trash. Wash hands thoroughly with soap and water. 2. You may remove the patch earlier than 72 hours if you experience unpleasant side effects which may include dry mouth, dizziness or visual disturbances. 3. Avoid touching the patch. Wash your hands with soap and water after contact with the patch.

## 2019-04-04 NOTE — Anesthesia Postprocedure Evaluation (Signed)
Anesthesia Post Note  Patient: [name]  Procedure(s) Performed: RIGHT CARPAL TUNNEL RELEASE (Right Wrist)     Patient location during evaluation: PACU Anesthesia Type: Bier Block Level of consciousness: awake and alert, awake and oriented Pain management: pain level controlled Vital Signs Assessment: post-procedure vital signs reviewed and stable Respiratory status: spontaneous breathing, nonlabored ventilation and respiratory function stable Cardiovascular status: stable and blood pressure returned to baseline Postop Assessment: no apparent nausea or vomiting Anesthetic complications: no    Last Vitals:  Vitals:   04/04/19 1100 04/04/19 1120  BP: (!) 96/59 96/63  Pulse: 63 67  Resp: 20 18  Temp:  36.4 C  SpO2: 98% 96%    Last Pain:  Vitals:   04/04/19 1120  TempSrc:   PainSc: 0-No pain                 Catalina Gravel

## 2019-04-04 NOTE — Brief Op Note (Signed)
04/04/2019  10:45 AM  PATIENT:  [name]  70 y.o. female  PRE-OPERATIVE DIAGNOSIS:  RIGHT CARPAL TUNNEL SYNDROME  POST-OPERATIVE DIAGNOSIS:  Right Carpal Tunnel syndrome  PROCEDURE:  Procedure(s): RIGHT CARPAL TUNNEL RELEASE (Right)  SURGEON:  Surgeon(s) and Role:    Daryll Brod, MD - Primary  PHYSICIAN ASSISTANT:   ASSISTANTS: none   ANESTHESIA:   Bier block with sedation and local  EBL:  2 mL  BLOOD ADMINISTERED:none  DRAINS: none   LOCAL MEDICATIONS USED:  BUPIVICAINE   SPECIMEN:  No Specimen  DISPOSITION OF SPECIMEN:  N/A  COUNTS:  YES  TOURNIQUET:   Total Tourniquet Time Documented: Forearm (Right) - 21 minutes Total: Forearm (Right) - 21 minutes   DICTATION: .Dragon Dictation  PLAN OF CARE: Discharge to home after PACU  PATIENT DISPOSITION:  PACU - hemodynamically stable.

## 2019-04-04 NOTE — Transfer of Care (Signed)
Immediate Anesthesia Transfer of Care Note  Patient: [name]  Procedure(s) Performed: RIGHT CARPAL TUNNEL RELEASE (Right Wrist)  Patient Location: PACU  Anesthesia Type:MAC  Level of Consciousness: awake, alert  and oriented  Airway & Oxygen Therapy: Patient Spontanous Breathing  Post-op Assessment: Report given to RN and Post -op Vital signs reviewed and stable  Post vital signs: Reviewed and stable  Last Vitals:  Vitals Value Taken Time  BP 102/58 04/04/19 1047  Temp    Pulse 68 04/04/19 1049  Resp 12 04/04/19 1049  SpO2 99 % 04/04/19 1049  Vitals shown include unvalidated device data.  Last Pain:  Vitals:   04/04/19 0909  TempSrc: Oral  PainSc: 0-No pain      Patients Stated Pain Goal: 3 (19/14/78 2956)  Complications: No apparent anesthesia complications
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
## 2019-04-04 NOTE — Op Note (Signed)
NAME: [name] MEDICAL RECORD NO: [id number] DATE OF BIRTH: [birth year] FACILITY: [facility] LOCATION: [facility] PHYSICIAN: Wynonia Sours, MD   OPERATIVE REPORT   DATE OF PROCEDURE: 04/04/19    PREOPERATIVE DIAGNOSIS:   Carpal tunnel syndrome right hand   POSTOPERATIVE DIAGNOSIS:   Same   PROCEDURE:   Decompression median nerve right hand   SURGEON: Daryll Brod, M.D.   ASSISTANT: none   ANESTHESIA:  Bier block with sedation and Local   INTRAVENOUS FLUIDS:  Per anesthesia flow sheet.   ESTIMATED BLOOD LOSS:  Minimal.   COMPLICATIONS:  None.   SPECIMENS:  none   TOURNIQUET TIME:    Total Tourniquet Time Documented: Forearm (Right) - 21 minutes Total: Forearm (Right) - 21 minutes    DISPOSITION:  Stable to PACU.   INDICATIONS: Patient is a 69 year old female with a history of numbness and tingling bilateral hands.  She has undergone conservative treatment but nerve conductions reveal carpal tunnel syndrome bilaterally.  She has undergone decompression of the median nerve on the left side is admitted now for decompression median nerve on the right.  Preperi-and postoperative course been discussed along with risks and complications.  She is aware that there is no guarantee to the surgery the possibility of infection recurrence injury to arteries nerves tendons complete relief of symptoms and dystrophy.  In the preoperative area the patient is seen the extremity marked by both patient and surgeon antibiotic given  OPERATIVE COURSE: Patient is brought to the operating room where a forearm-based IV regional anesthetic was carried out without difficulty under the direction the anesthesia department.  She was prepped using ChloraPrep and in supine position with the right arm free.  A 3-minute dry time was allowed timeout taken to confirm patient procedure.  A longitudinal incision was made in the right palm carried down through subcutaneous tissue.  Bleeders were  electrocauterized with bipolar.  Palmar fascia was split.  Superficial palmar arch was identified.  Flexor tendon the ring little fingers were identified.  Retractors were placed retracting median nerve flexor tendons radially and ulnar nerve ulnarly.  The flexor retinaculum was then released on its ulnar border with sharp dissection.  A right angle and stool retractor placed between skin and forearm fascia after dissecting the soft tissues.  The deep structures to the fascia with also bluntly dissected free.  The proximal aspect of the flexor retinaculum distal forearm fascia was then released for approximately 2 to 3 cm proximal to the wrist crease under direct vision.  The canal was explored.  A persistent median artery was present.  No further lesions were identified.  The motor branch entered in the muscle distally.  The wound was copiously irrigated with saline.  The skin was closed interrupted 4-0 nylon sutures.  Local infiltration quarter percent bupivacaine without epinephrine was given approximately 7 cc was used.  Sterile compressive dressing with the fingers free was applied.  On deflation of the tourniquet all fingers immediately pink.  He was she was taken to the recovery room for observation in satisfactory condition.  She will be discharged home to return the hand center [facility] in 1 week on Tylenol and ibuprofen for pain with Ultram for breakthrough.   Daryll Brod, MD Electronically signed, 04/04/19

## 2019-04-05 ENCOUNTER — Encounter: Payer: Self-pay | Admitting: *Deleted

## 2019-04-26 DIAGNOSIS — M7989 Other specified soft tissue disorders: Secondary | ICD-10-CM | POA: Diagnosis not present

## 2019-04-26 DIAGNOSIS — M1811 Unilateral primary osteoarthritis of first carpometacarpal joint, right hand: Secondary | ICD-10-CM | POA: Diagnosis not present

## 2019-04-26 DIAGNOSIS — M25441 Effusion, right hand: Secondary | ICD-10-CM | POA: Diagnosis not present

## 2019-04-26 DIAGNOSIS — M19041 Primary osteoarthritis, right hand: Secondary | ICD-10-CM | POA: Diagnosis not present

## 2019-04-29 DIAGNOSIS — M25641 Stiffness of right hand, not elsewhere classified: Secondary | ICD-10-CM | POA: Diagnosis not present

## 2019-04-29 DIAGNOSIS — M25631 Stiffness of right wrist, not elsewhere classified: Secondary | ICD-10-CM | POA: Diagnosis not present

## 2019-04-29 DIAGNOSIS — G5601 Carpal tunnel syndrome, right upper limb: Secondary | ICD-10-CM | POA: Diagnosis not present

## 2019-04-29 DIAGNOSIS — M79644 Pain in right finger(s): Secondary | ICD-10-CM | POA: Diagnosis not present

## 2019-04-29 DIAGNOSIS — M7989 Other specified soft tissue disorders: Secondary | ICD-10-CM | POA: Diagnosis not present

## 2019-04-29 DIAGNOSIS — M79641 Pain in right hand: Secondary | ICD-10-CM | POA: Diagnosis not present

## 2019-05-01 ENCOUNTER — Telehealth: Payer: Self-pay | Admitting: Orthopedic Surgery

## 2019-05-01 NOTE — Telephone Encounter (Signed)
Pls advise. Thanks.  

## 2019-05-01 NOTE — Telephone Encounter (Signed)
Patient called stating she has a dental appointment today having one of her veneers cemented back in. Patient asked if an antibiotic can be sent to [facility] on [address]. The appointment is at 11:20am. The number to contact patient is [phone]

## 2019-05-02 NOTE — Telephone Encounter (Signed)
Tried calling patient this morning.   No answer. LMVM advising per Lurena Joiner.

## 2019-05-06 ENCOUNTER — Other Ambulatory Visit: Payer: Self-pay | Admitting: Orthopedic Surgery

## 2019-05-06 DIAGNOSIS — S60221A Contusion of right hand, initial encounter: Secondary | ICD-10-CM

## 2019-05-06 DIAGNOSIS — M25641 Stiffness of right hand, not elsewhere classified: Secondary | ICD-10-CM | POA: Diagnosis not present

## 2019-05-06 DIAGNOSIS — M7989 Other specified soft tissue disorders: Secondary | ICD-10-CM | POA: Diagnosis not present

## 2019-05-06 DIAGNOSIS — M25631 Stiffness of right wrist, not elsewhere classified: Secondary | ICD-10-CM | POA: Diagnosis not present

## 2019-05-06 DIAGNOSIS — G5601 Carpal tunnel syndrome, right upper limb: Secondary | ICD-10-CM | POA: Diagnosis not present

## 2019-05-06 DIAGNOSIS — L089 Local infection of the skin and subcutaneous tissue, unspecified: Secondary | ICD-10-CM

## 2019-05-13 DIAGNOSIS — M7989 Other specified soft tissue disorders: Secondary | ICD-10-CM | POA: Diagnosis not present

## 2019-05-13 DIAGNOSIS — M25641 Stiffness of right hand, not elsewhere classified: Secondary | ICD-10-CM | POA: Diagnosis not present

## 2019-05-13 DIAGNOSIS — G5601 Carpal tunnel syndrome, right upper limb: Secondary | ICD-10-CM | POA: Diagnosis not present

## 2019-05-13 DIAGNOSIS — M25631 Stiffness of right wrist, not elsewhere classified: Secondary | ICD-10-CM | POA: Diagnosis not present

## 2019-05-24 DIAGNOSIS — Z780 Asymptomatic menopausal state: Secondary | ICD-10-CM | POA: Diagnosis not present

## 2019-05-24 DIAGNOSIS — Z1231 Encounter for screening mammogram for malignant neoplasm of breast: Secondary | ICD-10-CM | POA: Diagnosis not present

## 2019-05-28 ENCOUNTER — Other Ambulatory Visit: Payer: Medicare Other

## 2019-06-19 ENCOUNTER — Ambulatory Visit: Payer: Self-pay

## 2019-06-19 ENCOUNTER — Other Ambulatory Visit: Payer: Self-pay

## 2019-06-19 ENCOUNTER — Encounter: Payer: Self-pay | Admitting: Specialist

## 2019-06-19 ENCOUNTER — Ambulatory Visit (INDEPENDENT_AMBULATORY_CARE_PROVIDER_SITE_OTHER): Payer: Medicare Other | Admitting: Specialist

## 2019-06-19 VITALS — BP 131/78 | HR 79 | Ht 62.0 in | Wt 188.6 lb

## 2019-06-19 DIAGNOSIS — Z96651 Presence of right artificial knee joint: Secondary | ICD-10-CM

## 2019-06-19 DIAGNOSIS — M5136 Other intervertebral disc degeneration, lumbar region: Secondary | ICD-10-CM

## 2019-06-19 DIAGNOSIS — R29898 Other symptoms and signs involving the musculoskeletal system: Secondary | ICD-10-CM

## 2019-06-19 DIAGNOSIS — Z981 Arthrodesis status: Secondary | ICD-10-CM

## 2019-06-19 DIAGNOSIS — M48062 Spinal stenosis, lumbar region with neurogenic claudication: Secondary | ICD-10-CM

## 2019-06-19 NOTE — Patient Instructions (Signed)
B complex vitamin or individual B12, B6 thiamine and folic acid are nerve building blocks for reinnervation. Hip strengthening exercises as you have left hip abduction weakness. Unfortunately if surgery were considered it would involve  Extension of the fusion over multiple level. Hip Exercises Ask your health care provider which exercises are safe for you. Do exercises exactly as told by your health care provider and adjust them as directed. It is normal to feel mild stretching, pulling, tightness, or discomfort as you do these exercises. Stop right away if you feel sudden pain or your pain gets worse. Do not begin these exercises until told by your health care provider. Stretching and range-of-motion exercises These exercises warm up your muscles and joints and improve the movement and flexibility of your hip. These exercises also help to relieve pain, numbness, and tingling. You may be asked to limit your range of motion if you had a hip replacement. Talk to your health care provider about these restrictions. Hamstrings, supine  1. Lie on your back (supine position). 2. Loop a belt or towel over the ball of your left / right foot. The ball of your foot is on the walking surface, right under your toes. 3. Straighten your left / right knee and slowly pull on the belt or towel to raise your leg until you feel a gentle stretch behind your knee (hamstring). ? Do not let your knee bend while you do this. ? Keep your other leg flat on the floor. 4. Hold this position for __________ seconds. 5. Slowly return your leg to the starting position. Repeat __________ times. Complete this exercise __________ times a day. Hip rotation  1. Lie on your back on a firm surface. 2. With your left / right hand, gently pull your left / right knee toward the shoulder that is on the same side of the body. Stop when your knee is pointing toward the ceiling. 3. Hold your left / right ankle with your other  hand. 4. Keeping your knee steady, gently pull your left / right ankle toward your other shoulder until you feel a stretch in your buttocks. ? Keep your hips and shoulders firmly planted while you do this stretch. 5. Hold this position for __________ seconds. Repeat __________ times. Complete this exercise __________ times a day. Seated stretch This exercise is sometimes called hamstrings and adductors stretch. 1. Sit on the floor with your legs stretched wide. Keep your knees straight during this exercise. 2. Keeping your head and back in a straight line, bend at your waist to reach for your left foot (position A). You should feel a stretch in your right inner thigh (adductors). 3. Hold this position for __________ seconds. Then slowly return to the upright position. 4. Keeping your head and back in a straight line, bend at your waist to reach forward (position B). You should feel a stretch behind both of your thighs and knees (hamstrings). 5. Hold this position for __________ seconds. Then slowly return to the upright position. 6. Keeping your head and back in a straight line, bend at your waist to reach for your right foot (position C). You should feel a stretch in your left inner thigh (adductors). 7. Hold this position for __________ seconds. Then slowly return to the upright position. Repeat __________ times. Complete this exercise __________ times a day. Lunge This exercise stretches the muscles of the hip (hip flexors). 1. Place your left / right knee on the floor and bend your other knee so  that is directly over your ankle. You should be half-kneeling. 2. Keep good posture with your head over your shoulders. 3. Tighten your buttocks to point your tailbone downward. This will prevent your back from arching too much. 4. You should feel a gentle stretch in the front of your left / right thigh and hip. If you do not feel a stretch, slide your other foot forward slightly and then slowly lunge  forward with your chest up until your knee once again lines up over your ankle. ? Make sure your tailbone continues to point downward. 5. Hold this position for __________ seconds. 6. Slowly return to the starting position. Repeat __________ times. Complete this exercise __________ times a day. Strengthening exercises These exercises build strength and endurance in your hip. Endurance is the ability to use your muscles for a long time, even after they get tired. Bridge This exercise strengthens the muscles of your hip (hip extensors). 1. Lie on your back on a firm surface with your knees bent and your feet flat on the floor. 2. Tighten your buttocks muscles and lift your bottom off the floor until the trunk of your body and your hips are level with your thighs. ? Do not arch your back. ? You should feel the muscles working in your buttocks and the back of your thighs. If you do not feel these muscles, slide your feet 1-2 inches (2.5-5 cm) farther away from your buttocks. 3. Hold this position for __________ seconds. 4. Slowly lower your hips to the starting position. 5. Let your muscles relax completely between repetitions. Repeat __________ times. Complete this exercise __________ times a day. Straight leg raises, side-lying This exercise strengthens the muscles that move the hip joint away from the center of the body (hip abductors). 1. Lie on your side with your left / right leg in the top position. Lie so your head, shoulder, hip, and knee line up. You may bend your bottom knee slightly to help you balance. 2. Roll your hips slightly forward, so your hips are stacked directly over each other and your left / right knee is facing forward. 3. Leading with your heel, lift your top leg 4-6 inches (10-15 cm). You should feel the muscles in your top hip lifting. ? Do not let your foot drift forward. ? Do not let your knee roll toward the ceiling. 4. Hold this position for __________  seconds. 5. Slowly return to the starting position. 6. Let your muscles relax completely between repetitions. Repeat __________ times. Complete this exercise __________ times a day. Straight leg raises, side-lying This exercise strengthens the muscles that move the hip joint toward the center of the body (hip adductors). 1. Lie on your side with your left / right leg in the bottom position. Lie so your head, shoulder, hip, and knee line up. You may place your upper foot in front to help you balance. 2. Roll your hips slightly forward, so your hips are stacked directly over each other and your left / right knee is facing forward. 3. Tense the muscles in your inner thigh and lift your bottom leg 4-6 inches (10-15 cm). 4. Hold this position for __________ seconds. 5. Slowly return to the starting position. 6. Let your muscles relax completely between repetitions. Repeat __________ times. Complete this exercise __________ times a day. Straight leg raises, supine This exercise strengthens the muscles in the front of your thigh (quadriceps). 1. Lie on your back (supine position) with your left / right leg  extended and your other knee bent. 2. Tense the muscles in the front of your left / right thigh. You should see your kneecap slide up or see increased dimpling just above your knee. 3. Keep these muscles tight as you raise your leg 4-6 inches (10-15 cm) off the floor. Do not let your knee bend. 4. Hold this position for __________ seconds. 5. Keep these muscles tense as you lower your leg. 6. Relax the muscles slowly and completely between repetitions. Repeat __________ times. Complete this exercise __________ times a day. Hip abductors, standing This exercise strengthens the muscles that move the leg and hip joint away from the center of the body (hip abductors). 1. Tie one end of a rubber exercise band or tubing to a secure surface, such as a chair, table, or pole. 2. Loop the other end of the  band or tubing around your left / right ankle. 3. Keeping your ankle with the band or tubing directly opposite the secured end, step away until there is tension in the tubing or band. Hold on to a chair, table, or pole as needed for balance. 4. Lift your left / right leg out to your side. While you do this: ? Keep your back upright. ? Keep your shoulders over your hips. ? Keep your toes pointing forward. ? Make sure to use your hip muscles to slowly lift your leg. Do not tip your body or forcefully lift your leg. 5. Hold this position for __________ seconds. 6. Slowly return to the starting position. Repeat __________ times. Complete this exercise __________ times a day. Squats This exercise strengthens the muscles in the front of your thigh (quadriceps). 1. Stand in a door frame so your feet and knees are in line with the frame. You may place your hands on the frame for balance. 2. Slowly bend your knees and lower your hips like you are going to sit in a chair. ? Keep your lower legs in a straight-up-and-down position. ? Do not let your hips go lower than your knees. ? Do not bend your knees lower than told by your health care provider. ? If your hip pain increases, do not bend as low. 3. Hold this position for ___________ seconds. 4. Slowly push with your legs to return to standing. Do not use your hands to pull yourself to standing. Repeat __________ times. Complete this exercise __________ times a day. This information is not intended to replace advice given to you by your health care provider. Make sure you discuss any questions you have with your health care provider. Document Revised: 08/09/2018 Document Reviewed: 11/14/2017 Elsevier Patient Education  [place]. tiple levels. Exercise to improve strength is best. Call if you wish to consider PT for hip strengthening.

## 2019-06-19 NOTE — Progress Notes (Signed)
Office Visit Note   Patient: [name]           Date of Birth: Oct 15, 1950           MRN: [id number] Visit Date: 06/19/2019              Requested by: Leeroy Cha, MD [address] [place],  [place] [address] PCP: Leeroy Cha, MD   Assessment & Plan: Visit Diagnoses:  1. S/P lumbar spinal fusion   2. Spinal stenosis of lumbar region with neurogenic claudication   3. Degenerative disc disease, lumbar   4. S/P total knee arthroplasty, right   5. Weakness of left hip     Plan: B complex vitamin or individual B12, B6 thiamine and folic acid are nerve building blocks for reinnervation. Hip strengthening exercises as you have left hip abduction weakness. Unfortunately if surgery were considered it would involve  Extension of the fusion over multiple level.  Follow-Up Instructions: Return in about 2 months (around 08/19/2019).   Orders:  Orders Placed This Encounter  Procedures  . XR Lumbar Spine 2-3 Views   No orders of the defined types were placed in this encounter.     Procedures: No procedures performed   Clinical Data: No additional findings.   Subjective: Chief Complaint  Patient presents with  . Lower Back - Follow-up    69 year old female with history of lumbar fusion for spondylolisthesis. She has had a right TKR this past 11/27/2018 by Dr. Marlou Sa. She has been noticing some right leg limp with standing and walking. Happens when she walks too much. She has a history of left foot surgery bunion and hammer toe surgery. No bowel or bladder difficulty. She walks with a limp. She did walk a mile on Sunday. The left hip gives, With limp. She is able to walk stairs now post left knee surgery. There is no numbness. Since surgery on her back the pain she had is better and she has no back pain mainly it is weakness.    Review of Systems  Constitutional: Negative.   HENT: Negative.   Eyes: Negative.   Respiratory: Negative.     Cardiovascular: Negative.   Gastrointestinal: Negative.   Endocrine: Negative.   Genitourinary: Negative.   Musculoskeletal: Negative.   Skin: Negative.   Allergic/Immunologic: Negative.   Neurological: Negative.   Hematological: Negative.   Psychiatric/Behavioral: Negative.      Objective: Vital Signs: BP 131/78 (BP Location: Left Arm, Patient Position: Sitting)   Pulse 79   Ht 5\' 2"  (1.575 m)   Wt 188 lb 9.6 oz (85.5 kg)   BMI 34.50 kg/m   Physical Exam Constitutional:      Appearance: She is well-developed.  HENT:     Head: Normocephalic and atraumatic.  Eyes:     Pupils: Pupils are equal, round, and reactive to light.  Pulmonary:     Effort: Pulmonary effort is normal.     Breath sounds: Normal breath sounds.  Abdominal:     General: Bowel sounds are normal.     Palpations: Abdomen is soft.  Musculoskeletal:        General: Normal range of motion.     Cervical back: Normal range of motion and neck supple.  Skin:    General: Skin is warm and dry.  Neurological:     Mental Status: She is alert and oriented to person, place, and time.  Psychiatric:        Behavior:  Behavior normal.        Thought Content: Thought content normal.        Judgment: Judgment normal.     Back Exam   Tenderness  The patient is experiencing tenderness in the lumbar.      Specialty Comments:  No specialty comments available.  Imaging: No results found.   PMFS History: Patient Active Problem List   Diagnosis Date Noted  . Unilateral primary osteoarthritis, right knee 07/05/2017    Priority: High    Class: Chronic  . Anemia due to blood loss 07/05/2017    Priority: High    Class: Acute  . Arthritis of right knee 11/27/2018  . Spinal stenosis, lumbar region with neurogenic claudication   . Spondylolisthesis, lumbar region   . S/P lumbar spinal fusion 07/03/2017  . Mixed conductive and sensorineural hearing loss of left ear with restricted hearing of right ear  12/12/2016  . Acute laryngitis 03/10/2016  . Laryngopharyngeal reflux (LPR) 03/10/2016  . Tympanic membrane perforation, right 03/10/2016   Past Medical History:  Diagnosis Date  . Arthritis   . Carpal tunnel syndrome   . GERD (gastroesophageal reflux disease)   . Hard of hearing    left side  . Hypothyroid   . Pneumonia   . Varicose vein of leg     History reviewed. No pertinent family history.  Past Surgical History:  Procedure Laterality Date  . ABDOMINAL HYSTERECTOMY    . ABDOMINAL HYSTERECTOMY    . APPENDECTOMY    . BACK SURGERY    . CARPAL TUNNEL RELEASE Left 06/26/2018   Procedure: CARPAL TUNNEL RELEASE;  Surgeon: Daryll Brod, MD;  Location: [facility];  Service: Orthopedics;  Laterality: Left;  . CARPAL TUNNEL RELEASE Right 04/04/2019   Procedure: RIGHT CARPAL TUNNEL RELEASE;  Surgeon: Daryll Brod, MD;  Location: [facility];  Service: Orthopedics;  Laterality: Right;  . cholesteatoma Left   . COLON SURGERY     part of intestine removed  . FOOT SURGERY Bilateral   . IMPLANTATION BONE ANCHORED HEARING AID Left 01/2017  . INJECTION KNEE Right 07/03/2017   Procedure: KNEE INJECTION;  Surgeon: Jessy Oto, MD;  Location: [facility];  Service: Orthopedics;  Laterality: Right;  . KNEE ARTHROSCOPY Right   . SPINAL FUSION     L4 and L5 for spinal stenosis , 06/2017  . TONSILLECTOMY    . TOTAL KNEE ARTHROPLASTY Right 11/27/2018  . TOTAL KNEE ARTHROPLASTY Right 11/27/2018   Procedure: RIGHT TOTAL KNEE ARTHROPLASTY;  Surgeon: Meredith Pel, MD;  Location: [facility];  Service: Orthopedics;  Laterality: Right;   Social History   Occupational History  . Not on file  Tobacco Use  . Smoking status: Never Smoker  . Smokeless tobacco: Never Used  Substance and Sexual Activity  . Alcohol use: Yes    Alcohol/week: 1.0 standard drinks    Types: 1 Glasses of wine per week    Comment:  drinks wine daily with dinner  . Drug use: Never  . Sexual activity:  Yes    Birth control/protection: Surgical

## 2019-08-19 ENCOUNTER — Ambulatory Visit: Payer: Medicare Other | Admitting: Specialist

## 2019-09-24 DIAGNOSIS — R519 Headache, unspecified: Secondary | ICD-10-CM | POA: Diagnosis not present

## 2019-09-24 DIAGNOSIS — J029 Acute pharyngitis, unspecified: Secondary | ICD-10-CM | POA: Diagnosis not present

## 2019-09-24 DIAGNOSIS — Z20822 Contact with and (suspected) exposure to covid-19: Secondary | ICD-10-CM | POA: Diagnosis not present

## 2019-09-24 DIAGNOSIS — R5383 Other fatigue: Secondary | ICD-10-CM | POA: Diagnosis not present

## 2019-10-07 DIAGNOSIS — Z23 Encounter for immunization: Secondary | ICD-10-CM | POA: Diagnosis not present

## 2019-11-25 DIAGNOSIS — H401131 Primary open-angle glaucoma, bilateral, mild stage: Secondary | ICD-10-CM | POA: Diagnosis not present

## 2019-11-25 DIAGNOSIS — Z23 Encounter for immunization: Secondary | ICD-10-CM | POA: Diagnosis not present

## 2019-11-27 ENCOUNTER — Telehealth: Payer: Self-pay | Admitting: *Deleted

## 2019-11-27 NOTE — Telephone Encounter (Signed)
Ortho bundle 1 year call attempted. No answer and Left VM.

## 2019-11-28 ENCOUNTER — Telehealth: Payer: Self-pay | Admitting: *Deleted

## 2019-11-28 NOTE — Telephone Encounter (Signed)
1 year Ortho bundle call completed. ?

## 2019-12-20 DIAGNOSIS — Z0000 Encounter for general adult medical examination without abnormal findings: Secondary | ICD-10-CM | POA: Diagnosis not present

## 2019-12-20 DIAGNOSIS — E785 Hyperlipidemia, unspecified: Secondary | ICD-10-CM | POA: Diagnosis not present

## 2019-12-20 DIAGNOSIS — E039 Hypothyroidism, unspecified: Secondary | ICD-10-CM | POA: Diagnosis not present

## 2019-12-20 DIAGNOSIS — Z79899 Other long term (current) drug therapy: Secondary | ICD-10-CM | POA: Diagnosis not present

## 2019-12-20 DIAGNOSIS — Z1389 Encounter for screening for other disorder: Secondary | ICD-10-CM | POA: Diagnosis not present

## 2020-01-28 DIAGNOSIS — H9212 Otorrhea, left ear: Secondary | ICD-10-CM | POA: Diagnosis not present

## 2020-01-28 DIAGNOSIS — H7291 Unspecified perforation of tympanic membrane, right ear: Secondary | ICD-10-CM | POA: Diagnosis not present

## 2020-01-28 DIAGNOSIS — Z9622 Myringotomy tube(s) status: Secondary | ICD-10-CM | POA: Diagnosis not present

## 2020-04-02 DIAGNOSIS — Z79899 Other long term (current) drug therapy: Secondary | ICD-10-CM | POA: Diagnosis not present

## 2020-04-02 DIAGNOSIS — L400 Psoriasis vulgaris: Secondary | ICD-10-CM | POA: Diagnosis not present

## 2020-04-02 DIAGNOSIS — L239 Allergic contact dermatitis, unspecified cause: Secondary | ICD-10-CM | POA: Diagnosis not present

## 2020-05-06 DIAGNOSIS — H353131 Nonexudative age-related macular degeneration, bilateral, early dry stage: Secondary | ICD-10-CM | POA: Diagnosis not present

## 2020-05-07 DIAGNOSIS — Z79899 Other long term (current) drug therapy: Secondary | ICD-10-CM | POA: Diagnosis not present

## 2020-05-07 DIAGNOSIS — L2089 Other atopic dermatitis: Secondary | ICD-10-CM | POA: Diagnosis not present

## 2020-05-29 DIAGNOSIS — Z1231 Encounter for screening mammogram for malignant neoplasm of breast: Secondary | ICD-10-CM | POA: Diagnosis not present

## 2020-05-30 DIAGNOSIS — Z23 Encounter for immunization: Secondary | ICD-10-CM | POA: Diagnosis not present

## 2020-06-01 DIAGNOSIS — H3530 Unspecified macular degeneration: Secondary | ICD-10-CM | POA: Diagnosis not present

## 2020-06-01 DIAGNOSIS — H524 Presbyopia: Secondary | ICD-10-CM | POA: Diagnosis not present

## 2020-06-01 DIAGNOSIS — H401121 Primary open-angle glaucoma, left eye, mild stage: Secondary | ICD-10-CM | POA: Diagnosis not present

## 2020-06-01 DIAGNOSIS — H2513 Age-related nuclear cataract, bilateral: Secondary | ICD-10-CM | POA: Diagnosis not present

## 2020-06-01 DIAGNOSIS — H353131 Nonexudative age-related macular degeneration, bilateral, early dry stage: Secondary | ICD-10-CM | POA: Diagnosis not present

## 2020-06-01 DIAGNOSIS — H5203 Hypermetropia, bilateral: Secondary | ICD-10-CM | POA: Diagnosis not present

## 2020-06-01 DIAGNOSIS — H52223 Regular astigmatism, bilateral: Secondary | ICD-10-CM | POA: Diagnosis not present

## 2020-06-01 DIAGNOSIS — H401110 Primary open-angle glaucoma, right eye, stage unspecified: Secondary | ICD-10-CM | POA: Diagnosis not present

## 2020-06-04 DIAGNOSIS — H353131 Nonexudative age-related macular degeneration, bilateral, early dry stage: Secondary | ICD-10-CM | POA: Diagnosis not present

## 2020-06-15 DIAGNOSIS — S8010XA Contusion of unspecified lower leg, initial encounter: Secondary | ICD-10-CM | POA: Diagnosis not present

## 2020-06-15 DIAGNOSIS — S63501A Unspecified sprain of right wrist, initial encounter: Secondary | ICD-10-CM | POA: Diagnosis not present

## 2020-07-06 DIAGNOSIS — H52223 Regular astigmatism, bilateral: Secondary | ICD-10-CM | POA: Diagnosis not present

## 2020-07-06 DIAGNOSIS — H35363 Drusen (degenerative) of macula, bilateral: Secondary | ICD-10-CM | POA: Diagnosis not present

## 2020-07-06 DIAGNOSIS — H524 Presbyopia: Secondary | ICD-10-CM | POA: Diagnosis not present

## 2020-07-06 DIAGNOSIS — H5203 Hypermetropia, bilateral: Secondary | ICD-10-CM | POA: Diagnosis not present

## 2020-07-06 DIAGNOSIS — H353131 Nonexudative age-related macular degeneration, bilateral, early dry stage: Secondary | ICD-10-CM | POA: Diagnosis not present

## 2020-07-16 DIAGNOSIS — K219 Gastro-esophageal reflux disease without esophagitis: Secondary | ICD-10-CM | POA: Diagnosis not present

## 2020-07-16 DIAGNOSIS — H7291 Unspecified perforation of tympanic membrane, right ear: Secondary | ICD-10-CM | POA: Diagnosis not present

## 2020-07-16 DIAGNOSIS — Z9622 Myringotomy tube(s) status: Secondary | ICD-10-CM | POA: Diagnosis not present

## 2020-07-16 DIAGNOSIS — H6122 Impacted cerumen, left ear: Secondary | ICD-10-CM | POA: Diagnosis not present

## 2020-07-16 DIAGNOSIS — J343 Hypertrophy of nasal turbinates: Secondary | ICD-10-CM | POA: Diagnosis not present

## 2020-07-27 ENCOUNTER — Other Ambulatory Visit: Payer: Self-pay

## 2020-07-27 ENCOUNTER — Encounter (INDEPENDENT_AMBULATORY_CARE_PROVIDER_SITE_OTHER): Payer: Medicare Other | Admitting: Ophthalmology

## 2020-07-27 DIAGNOSIS — H43813 Vitreous degeneration, bilateral: Secondary | ICD-10-CM | POA: Diagnosis not present

## 2020-07-27 DIAGNOSIS — H353132 Nonexudative age-related macular degeneration, bilateral, intermediate dry stage: Secondary | ICD-10-CM

## 2020-07-27 DIAGNOSIS — H2513 Age-related nuclear cataract, bilateral: Secondary | ICD-10-CM

## 2020-10-02 DIAGNOSIS — M19031 Primary osteoarthritis, right wrist: Secondary | ICD-10-CM | POA: Diagnosis not present

## 2020-10-02 DIAGNOSIS — Z8781 Personal history of (healed) traumatic fracture: Secondary | ICD-10-CM | POA: Diagnosis not present

## 2020-10-02 DIAGNOSIS — M25531 Pain in right wrist: Secondary | ICD-10-CM | POA: Diagnosis not present

## 2020-10-02 DIAGNOSIS — M1811 Unilateral primary osteoarthritis of first carpometacarpal joint, right hand: Secondary | ICD-10-CM | POA: Diagnosis not present

## 2020-10-06 DIAGNOSIS — M25531 Pain in right wrist: Secondary | ICD-10-CM | POA: Diagnosis not present

## 2020-10-13 DIAGNOSIS — M25641 Stiffness of right hand, not elsewhere classified: Secondary | ICD-10-CM | POA: Diagnosis not present

## 2020-10-13 DIAGNOSIS — M25631 Stiffness of right wrist, not elsewhere classified: Secondary | ICD-10-CM | POA: Diagnosis not present

## 2020-10-13 DIAGNOSIS — M25531 Pain in right wrist: Secondary | ICD-10-CM | POA: Diagnosis not present

## 2020-10-15 DIAGNOSIS — Z23 Encounter for immunization: Secondary | ICD-10-CM | POA: Diagnosis not present

## 2020-10-27 DIAGNOSIS — M25641 Stiffness of right hand, not elsewhere classified: Secondary | ICD-10-CM | POA: Diagnosis not present

## 2020-10-27 DIAGNOSIS — M25531 Pain in right wrist: Secondary | ICD-10-CM | POA: Diagnosis not present

## 2020-10-27 DIAGNOSIS — M25631 Stiffness of right wrist, not elsewhere classified: Secondary | ICD-10-CM | POA: Diagnosis not present

## 2020-11-19 ENCOUNTER — Ambulatory Visit (INDEPENDENT_AMBULATORY_CARE_PROVIDER_SITE_OTHER): Payer: Medicare Other | Admitting: Surgery

## 2020-11-19 ENCOUNTER — Encounter: Payer: Self-pay | Admitting: Surgery

## 2020-11-19 ENCOUNTER — Ambulatory Visit: Payer: Self-pay

## 2020-11-19 DIAGNOSIS — M5440 Lumbago with sciatica, unspecified side: Secondary | ICD-10-CM

## 2020-11-19 DIAGNOSIS — Z981 Arthrodesis status: Secondary | ICD-10-CM

## 2020-11-19 DIAGNOSIS — M533 Sacrococcygeal disorders, not elsewhere classified: Secondary | ICD-10-CM | POA: Diagnosis not present

## 2020-11-19 DIAGNOSIS — G8929 Other chronic pain: Secondary | ICD-10-CM

## 2020-11-19 MED ORDER — METHYLPREDNISOLONE 4 MG PO TABS: ORAL_TABLET | ORAL | 0 refills | Status: AC

## 2020-11-19 NOTE — Progress Notes (Signed)
Office Visit Note   Patient: [name]           Date of Birth: 21-Nov-1950           MRN: [id number] Visit Date: 11/19/2020              Requested by: Leeroy Cha, MD [address] [place],  [place] [address] PCP: Leeroy Cha, MD   Assessment & Plan: Visit Diagnoses:  1. Acute right-sided low back pain with sciatica, sciatica laterality unspecified   2. S/P lumbar spinal fusion   3. Chronic SI joint pain     Plan: Prednisone 6-day taper was sent into the pharmacy.  Follow-up with me in 3 weeks for recheck.  We will make decision as to whether or not further imaging studies are indicated.  Follow-Up Instructions: No follow-ups on file.   Orders:  Orders Placed This Encounter  Procedures   XR Lumbar Spine 2-3 Views   Meds ordered this encounter  Medications   methylPREDNISolone (MEDROL) 4 MG tablet    Sig: 6 day taper to be taken as directed.    Dispense:  21 tablet    Refill:  0      Procedures: No procedures performed   Clinical Data: No additional findings.   Subjective: No chief complaint on file.   HPI 70 year old female comes in with complaints of low back pain.  Patient is status post right L4-5 TLIF and bilateral L5-S1 hemilaminectomies with Dr. Louanne Skye 2019.  Patient states that she had a fall May 2022 off her deck.  Since then she has been having pain across her low back.  Denies lower extremity radicular symptoms. Review of Systems No current complaints of cardiopulmonary GI/GU issues  Objective: Vital Signs: There were no vitals taken for this visit.  Physical Exam HENT:     Head: Normocephalic and atraumatic.     Nose: Nose normal.  Pulmonary:     Effort: No respiratory distress.  Musculoskeletal:     Comments: Patient has bilateral lumbar paraspinal tenderness/spasm.  Negative logroll.  Negative straight leg raise.  No focal motor deficits.  Neurological:     Mental Status: She is alert and oriented  to person, place, and time.     Ortho Exam  Specialty Comments:  No specialty comments available.  Imaging: No results found.   PMFS History: Patient Active Problem List   Diagnosis Date Noted   Arthritis of right knee 11/27/2018   Unilateral primary osteoarthritis, right knee 07/05/2017    Class: Chronic   Anemia due to blood loss 07/05/2017    Class: Acute   Spinal stenosis, lumbar region with neurogenic claudication    Spondylolisthesis, lumbar region    S/P lumbar spinal fusion 07/03/2017   Mixed conductive and sensorineural hearing loss of left ear with restricted hearing of right ear 12/12/2016   Acute laryngitis 03/10/2016   Laryngopharyngeal reflux (LPR) 03/10/2016   Tympanic membrane perforation, right 03/10/2016   Past Medical History:  Diagnosis Date   Arthritis    Carpal tunnel syndrome    GERD (gastroesophageal reflux disease)    Hard of hearing    left side   Hypothyroid    Pneumonia    Varicose vein of leg     History reviewed. No pertinent family history.  Past Surgical History:  Procedure Laterality Date   ABDOMINAL HYSTERECTOMY     ABDOMINAL HYSTERECTOMY     APPENDECTOMY     BACK SURGERY  CARPAL TUNNEL RELEASE Left 06/26/2018   Procedure: CARPAL TUNNEL RELEASE;  Surgeon: Daryll Brod, MD;  Location: [facility];  Service: Orthopedics;  Laterality: Left;   CARPAL TUNNEL RELEASE Right 04/04/2019   Procedure: RIGHT CARPAL TUNNEL RELEASE;  Surgeon: Daryll Brod, MD;  Location: [facility];  Service: Orthopedics;  Laterality: Right;   cholesteatoma Left    COLON SURGERY     part of intestine removed   FOOT SURGERY Bilateral    IMPLANTATION BONE ANCHORED HEARING AID Left 01/2017   INJECTION KNEE Right 07/03/2017   Procedure: KNEE INJECTION;  Surgeon: Jessy Oto, MD;  Location: [facility];  Service: Orthopedics;  Laterality: Right;   KNEE ARTHROSCOPY Right    SPINAL FUSION     L4 and L5 for spinal stenosis , 06/2017    TONSILLECTOMY     TOTAL KNEE ARTHROPLASTY Right 11/27/2018   TOTAL KNEE ARTHROPLASTY Right 11/27/2018   Procedure: RIGHT TOTAL KNEE ARTHROPLASTY;  Surgeon: Meredith Pel, MD;  Location: [facility];  Service: Orthopedics;  Laterality: Right;   Social History   Occupational History   Not on file  Tobacco Use   Smoking status: Never   Smokeless tobacco: Never  Vaping Use   Vaping Use: Never used  Substance and Sexual Activity   Alcohol use: Yes    Alcohol/week: 1.0 standard drink of alcohol    Types: 1 Glasses of wine per week    Comment:  drinks wine daily with dinner   Drug use: Never   Sexual activity: Yes    Birth control/protection: Surgical

## 2020-11-25 ENCOUNTER — Ambulatory Visit: Payer: Self-pay

## 2020-11-25 ENCOUNTER — Encounter: Payer: Self-pay | Admitting: Orthopedic Surgery

## 2020-11-25 ENCOUNTER — Ambulatory Visit (INDEPENDENT_AMBULATORY_CARE_PROVIDER_SITE_OTHER): Payer: Medicare Other | Admitting: Surgical

## 2020-11-25 ENCOUNTER — Other Ambulatory Visit: Payer: Self-pay

## 2020-11-25 DIAGNOSIS — Z96651 Presence of right artificial knee joint: Secondary | ICD-10-CM

## 2020-11-25 DIAGNOSIS — M25552 Pain in left hip: Secondary | ICD-10-CM | POA: Diagnosis not present

## 2020-11-28 ENCOUNTER — Encounter: Payer: Self-pay | Admitting: Orthopedic Surgery

## 2020-11-28 NOTE — Progress Notes (Signed)
Office Visit Note   Patient: [name]           Date of Birth: 1950-09-20           MRN: [id number] Visit Date: 11/25/2020 Requested by: Leeroy Cha, MD [address] [place],  [place] [address] PCP: Leeroy Cha, MD  Subjective: Chief Complaint  Patient presents with   Right Knee - Follow-up    HPI: [name] is a 70 y.o. female who presents to the office complaining of right knee pain.  Patient states that she fell off her deck at the end of May 2022 which resulted in injury to the right knee.  She also had a previous fall where she landed on the anterior aspect of both knees after slipping on acorns in fall 2021.  She has history of right total knee arthroplasty with Dr. Marlou Sa.  She localizes most of her pain to the anterior and lateral aspect of the right knee without any radiation.  Denies any fevers, chills, night sweats, radicular pain, numbness/tingling.  Not associated with any activities.  She occasionally wakes with her knee pain.  She did notice bruising throughout the entirety of the right leg after her initial fall in May 2022.  She also broke her right wrist with the fall but this has recovered well.  She is taking ibuprofen as needed for pain.  She also recently saw Benjiman Core, PA-C for her back and had been placed on oral prednisone course.  She is just finishing up that prednisone course and this has significantly helped her right knee pain.  She also reports a sensation that she describes as "dopey" in her left hip when she is walking.  She feels that when she walks her left hip dips down and she cannot keep her pelvis stable.  This worsens after she walks for long periods of time.  This was present before the fall in May 2022 but has been much more noticeable and worse since her fall.  She localizes pain to the lateral aspect of the left hip.  Denies any groin pain.  Never had any history of left hip surgery..                 ROS: All systems reviewed are negative as they relate to the chief complaint within the history of present illness.  Patient denies fevers or chills.  Assessment & Plan: Visit Diagnoses:  1. S/P total knee arthroplasty, right   2. Left hip pain     Plan: Patient is a 70 year old female who presents for evaluation of right knee pain and left hip pain with Trendelenburg gait.  She has history of right total knee arthroplasty and has sustained several falls since surgery.  Radiographs of the right knee taken today demonstrate no change in position of the prosthesis or any lucency suggesting loosening of the prosthesis but she does have significant calcification inferior to the patella and the patellar tendon.  This calcification is palpable on exam though this does not seem to really correlate with her area of discomfort.  Presently she is not really having much right knee pain as she is just finishing up a prednisone course that was prescribed for her back pain from Benjiman Core, PA-C.  More concerning today is the ongoing Trendelenburg gait that she is describing along with objective weakness of her hip AB duction and internal rotation on exam today.  She had recent lumbar spine radiographs that were  reviewed today and demonstrated no evidence of bilateral hip osteoarthritis.  With no arthritic source of Trendelenburg gait and the weakness noted on exam today, suspicious for gluteal tendinopathy from prior falls.  Plan to order MRI of the left hip for evaluation of gluteal tendinopathy.  Follow-up after MRI to review results and also to see if her right knee pain has returned.  Follow-Up Instructions: No follow-ups on file.   Orders:  Orders Placed This Encounter  Procedures   XR Knee 1-2 Views Right   MR Hip Left w/o contrast   No orders of the defined types were placed in this encounter.     Procedures: No procedures performed   Clinical Data: No additional  findings.  Objective: Vital Signs: There were no vitals taken for this visit.  Physical Exam:  Constitutional: Patient appears well-developed HEENT:  Head: Normocephalic Eyes:EOM are normal Neck: Normal range of motion Cardiovascular: Normal rate Pulmonary/chest: Effort normal Neurologic: Patient is alert Skin: Skin is warm Psychiatric: Patient has normal mood and affect  Ortho Exam: Ortho exam demonstrates right knee with well-healed incision from prior right total knee arthroplasty.  No effusion noted.  No real tenderness over the joint line though there is some increased density over the right patellar tendon compared with the left patellar tendon.  She has 0 degrees of extension and 115 degrees of knee flexion.  She has excellent quadricep strength with no extensor lag.  No calf tenderness.  Negative Homans' sign.  No pain with right hip range of motion.  No real tenderness over the right greater trochanter.  She has tenderness over the left greater trochanter with 4/5 left hip AB duction compared with 5/5 right hip abduction strength.  She has no pain with left hip range of motion.  Negative Stinchfield sign.  She has substantial weakness with active internal rotation of the left hip versus the right hip; she is able to keep her right hip in an internally rotated position against gravity and against resistance but with the left hip she cannot even keep her left hip internally rotated against gravity while in a seated position.  She ambulates with a Trendelenburg gait.  Specialty Comments:  No specialty comments available.  Imaging: No results found.   PMFS History: Patient Active Problem List   Diagnosis Date Noted   Arthritis of right knee 11/27/2018   Unilateral primary osteoarthritis, right knee 07/05/2017    Class: Chronic   Anemia due to blood loss 07/05/2017    Class: Acute   Spinal stenosis, lumbar region with neurogenic claudication    Spondylolisthesis, lumbar  region    S/P lumbar spinal fusion 07/03/2017   Mixed conductive and sensorineural hearing loss of left ear with restricted hearing of right ear 12/12/2016   Acute laryngitis 03/10/2016   Laryngopharyngeal reflux (LPR) 03/10/2016   Tympanic membrane perforation, right 03/10/2016   Past Medical History:  Diagnosis Date   Arthritis    Carpal tunnel syndrome    GERD (gastroesophageal reflux disease)    Hard of hearing    left side   Hypothyroid    Pneumonia    Varicose vein of leg     History reviewed. No pertinent family history.  Past Surgical History:  Procedure Laterality Date   ABDOMINAL HYSTERECTOMY     ABDOMINAL HYSTERECTOMY     APPENDECTOMY     BACK SURGERY     CARPAL TUNNEL RELEASE Left 06/26/2018   Procedure: CARPAL TUNNEL RELEASE;  Surgeon: Daryll Brod, MD;  Location: [facility];  Service: Orthopedics;  Laterality: Left;   CARPAL TUNNEL RELEASE Right 04/04/2019   Procedure: RIGHT CARPAL TUNNEL RELEASE;  Surgeon: Daryll Brod, MD;  Location: [facility];  Service: Orthopedics;  Laterality: Right;   cholesteatoma Left    COLON SURGERY     part of intestine removed   FOOT SURGERY Bilateral    IMPLANTATION BONE ANCHORED HEARING AID Left 01/2017   INJECTION KNEE Right 07/03/2017   Procedure: KNEE INJECTION;  Surgeon: Jessy Oto, MD;  Location: [facility];  Service: Orthopedics;  Laterality: Right;   KNEE ARTHROSCOPY Right    SPINAL FUSION     L4 and L5 for spinal stenosis , 06/2017   TONSILLECTOMY     TOTAL KNEE ARTHROPLASTY Right 11/27/2018   TOTAL KNEE ARTHROPLASTY Right 11/27/2018   Procedure: RIGHT TOTAL KNEE ARTHROPLASTY;  Surgeon: Meredith Pel, MD;  Location: [facility];  Service: Orthopedics;  Laterality: Right;   Social History   Occupational History   Not on file  Tobacco Use   Smoking status: Never   Smokeless tobacco: Never  Vaping Use   Vaping Use: Never used  Substance and Sexual Activity   Alcohol use: Yes     Alcohol/week: 1.0 standard drink    Types: 1 Glasses of wine per week    Comment:  drinks wine daily with dinner   Drug use: Never   Sexual activity: Yes    Birth control/protection: Surgical

## 2020-11-30 DIAGNOSIS — H401131 Primary open-angle glaucoma, bilateral, mild stage: Secondary | ICD-10-CM | POA: Diagnosis not present

## 2020-12-09 DIAGNOSIS — M25531 Pain in right wrist: Secondary | ICD-10-CM | POA: Diagnosis not present

## 2020-12-09 DIAGNOSIS — M180 Bilateral primary osteoarthritis of first carpometacarpal joints: Secondary | ICD-10-CM | POA: Diagnosis not present

## 2020-12-17 ENCOUNTER — Ambulatory Visit: Payer: Medicare Other | Admitting: Surgery

## 2020-12-18 ENCOUNTER — Ambulatory Visit
Admission: RE | Admit: 2020-12-18 | Discharge: 2020-12-18 | Disposition: A | Discharge status: Home / Self Care | Payer: Medicare Other | Source: Ambulatory Visit | Attending: Orthopedic Surgery | Admitting: Orthopedic Surgery

## 2020-12-18 DIAGNOSIS — M25552 Pain in left hip: Secondary | ICD-10-CM | POA: Diagnosis not present

## 2020-12-28 NOTE — Progress Notes (Signed)
I called and left message on machine.  She has gluteal muscle atrophy but no tendon tear.Called and left message to that effect.  Needs MRI scan of her back because of the severe atrophy of the gluteus minimus and medius muscles.  Thanks could you order MRI scan lumbar spine.  Thanks not too much arthritis

## 2020-12-29 ENCOUNTER — Other Ambulatory Visit: Payer: Self-pay

## 2020-12-29 DIAGNOSIS — M5440 Lumbago with sciatica, unspecified side: Secondary | ICD-10-CM

## 2020-12-30 ENCOUNTER — Other Ambulatory Visit: Payer: Medicare Other

## 2020-12-31 ENCOUNTER — Ambulatory Visit: Payer: Medicare Other | Admitting: Surgery

## 2021-01-01 ENCOUNTER — Telehealth: Payer: Self-pay

## 2021-01-01 ENCOUNTER — Ambulatory Visit: Payer: Medicare Other | Admitting: Surgical

## 2021-01-01 DIAGNOSIS — M79605 Pain in left leg: Secondary | ICD-10-CM

## 2021-01-01 NOTE — Telephone Encounter (Signed)
Just left thx

## 2021-01-01 NOTE — Telephone Encounter (Signed)
I called the patient.  She has severe atrophy of the gluteus medius and minimus.  Likely coming from her back.  Tendons okay.  Please order MRI scan of her lumbar spine to evaluate for left-sided nerve compression and she also needs a nerve study so we can find out what is happening with the the nerves thanks

## 2021-01-01 NOTE — Telephone Encounter (Signed)
Patient was scheduled to be seen this afternoon. She wanted to know if she could be called with results and what plan of care is. Please call patient at [phone]

## 2021-01-04 ENCOUNTER — Encounter: Payer: Self-pay | Admitting: Neurology

## 2021-01-04 ENCOUNTER — Other Ambulatory Visit: Payer: Self-pay

## 2021-01-04 DIAGNOSIS — R202 Paresthesia of skin: Secondary | ICD-10-CM

## 2021-01-04 NOTE — Addendum Note (Signed)
Addended byLaurann Montana on: 01/04/2021 09:52 AM   Modules accepted: Orders

## 2021-01-22 ENCOUNTER — Ambulatory Visit: Payer: Medicare Other | Admitting: Surgical

## 2021-01-28 ENCOUNTER — Other Ambulatory Visit: Payer: Self-pay

## 2021-01-28 ENCOUNTER — Ambulatory Visit
Admission: RE | Admit: 2021-01-28 | Discharge: 2021-01-28 | Disposition: A | Discharge status: Home / Self Care | Payer: Medicare Other | Source: Ambulatory Visit | Attending: Orthopedic Surgery | Admitting: Orthopedic Surgery

## 2021-01-28 DIAGNOSIS — M545 Low back pain, unspecified: Secondary | ICD-10-CM | POA: Diagnosis not present

## 2021-01-28 DIAGNOSIS — M48061 Spinal stenosis, lumbar region without neurogenic claudication: Secondary | ICD-10-CM | POA: Diagnosis not present

## 2021-01-28 DIAGNOSIS — M5440 Lumbago with sciatica, unspecified side: Secondary | ICD-10-CM

## 2021-02-04 ENCOUNTER — Ambulatory Visit (INDEPENDENT_AMBULATORY_CARE_PROVIDER_SITE_OTHER): Payer: Medicare Other | Admitting: Neurology

## 2021-02-04 ENCOUNTER — Other Ambulatory Visit: Payer: Self-pay

## 2021-02-04 DIAGNOSIS — R202 Paresthesia of skin: Secondary | ICD-10-CM

## 2021-02-04 DIAGNOSIS — M5417 Radiculopathy, lumbosacral region: Secondary | ICD-10-CM

## 2021-02-04 NOTE — Procedures (Addendum)
[facility]  [place], [place]  [place], [place] [address] Tel: [phone] Fax:  [phone] Test Date:  02/04/2021  Patient: [name] DOB: 05-04-1950 Physician: Narda Amber, DO  Sex: Female Height: 5\' 2"  Ref Phys: Tonna Corner Dean,M.D.  ID#: [id number]   Technician:    Patient Complaints: This is a 71 year old female referred for evaluation of left leg pain and weakness.  NCV & EMG Findings: Extensive electrodiagnostic testing of the left lower extremity shows: Left sural and superficial peroneal sensory responses are within normal limits. Left peroneal and tibial motor responses are within normal limits. Left tibial H reflex studies within normal limits. Chronic motor axonal loss changes are seen affecting the S1 myotome on the left, without accompanied active denervation.  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 There is no evidence of a sensorimotor polyneuropathy affecting the lower extremity.   ___________________________ Narda Amber, DO    Nerve Conduction Studies Anti Sensory Summary Table   Stim Site NR Peak (ms) Norm Peak (ms) P-T Amp (V) Norm P-T Amp  Left Sup Peroneal Anti Sensory (Ant Lat Mall)  32C  12 cm    2.8 <4.6 9.4 >3  Left Sural Anti Sensory (Lat Mall)  32C  Calf    2.7 <4.6 15.3 >3   Motor Summary Table   Stim Site NR Onset (ms) Norm Onset (ms) O-P Amp (mV) Norm O-P Amp Site1 Site2 Delta-0 (ms) Dist (cm) Vel (m/s) Norm Vel (m/s)  Left Peroneal Motor (Ext Dig Brev)  32C  Ankle    3.8 <6.0 3.6 >2.5 B Fib Ankle 5.7 31.0 54 >40  B Fib    9.5  3.2  Poplt B Fib 1.4 7.0 50 >40  Poplt    10.9  3.1         Left Tibial Motor (Abd Hall Brev)  32C  Ankle    3.5 <6.0 4.6 >4 Knee Ankle 7.9 39.0 49 >40  Knee    11.4  4.4          H Reflex Studies   NR H-Lat (ms) Lat Norm (ms) L-R H-Lat (ms)  Left Tibial (Gastroc)  32C     30.61 <35    EMG   Side Muscle Ins Act Fibs Psw  Fasc Number Recrt Dur Dur. Amp Amp. Poly Poly. Comment  Left AntTibialis Nml Nml Nml Nml Nml Nml Nml Nml Nml Nml Nml Nml N/A  Left Gastroc Nml Nml Nml Nml 2- Rapid Many 1+ Many 1+ Many 1+ N/A  Left RectFemoris Nml Nml Nml Nml Nml Nml Nml Nml Nml Nml Nml Nml N/A  Left GluteusMed Nml Nml Nml Nml Nml Nml Nml Nml Nml Nml Nml Nml N/A  Left BicepsFemS Nml Nml Nml Nml 2- Rapid Many 1+ Many 1+ Many 1+ N/A      Waveforms:

## 2021-02-10 ENCOUNTER — Ambulatory Visit (INDEPENDENT_AMBULATORY_CARE_PROVIDER_SITE_OTHER): Payer: Medicare Other | Admitting: Orthopedic Surgery

## 2021-02-10 ENCOUNTER — Encounter: Payer: Self-pay | Admitting: Orthopedic Surgery

## 2021-02-10 ENCOUNTER — Telehealth: Payer: Self-pay

## 2021-02-10 ENCOUNTER — Other Ambulatory Visit: Payer: Self-pay

## 2021-02-10 DIAGNOSIS — M5440 Lumbago with sciatica, unspecified side: Secondary | ICD-10-CM | POA: Diagnosis not present

## 2021-02-10 NOTE — Telephone Encounter (Signed)
Dr Marlou Sa would like patient to be worked in ASAP for surgical  consult.

## 2021-02-10 NOTE — Progress Notes (Signed)
Office Visit Note   Patient: [name]           Date of Birth: 1950-02-23           MRN: [id number] Visit Date: 02/10/2021 Requested by: Leeroy Cha, MD [address] [place],  [place] [address] PCP: Leeroy Cha, MD  Subjective: Chief Complaint  Patient presents with   Other    Scan review    HPI: [name] is a 71 year old patient with back and left leg pain.  She had an MRI of the hip which shows relatively normal hip joint but with severe atrophy of the gluteus minimus and medius.  She has had an MRI scan of her lumbar spine which shows severe stenosis at L3-4 above the level of prior fusion.  She does describe decreased walking endurance as well as atypical gait.  She would like an intervention.  She had fusion surgery done several years ago by Dr. Louanne Skye.              ROS: All systems reviewed are negative as they relate to the chief complaint within the history of present illness.  Patient denies  fevers or chills.   Assessment & Plan: Visit Diagnoses:  1. Acute right-sided low back pain with sciatica, sciatica laterality unspecified     Plan: Impression is antalgic gait with gluteus minimus and medius atrophy causing her Trendelenburg type of problem.  The tendon some cells are intact.  She has severe stenosis above the level of prior fusion and I think that left-sided L4 nerve root is likely affected.  Hip flexion strength intact but abductor's are significantly affected by this problem.  I think she needs surgical evaluation for fairly severe stenosis above the level of prior fusion.  Follow-up with Dr. Louanne Skye.  Follow-Up Instructions: No follow-ups on file.   Orders:  No orders of the defined types were placed in this encounter.  No orders of the defined types were placed in this encounter.     Procedures: No procedures performed   Clinical Data: No additional findings.  Objective: Vital Signs: There were no vitals taken  for this visit.  Physical Exam:   Constitutional: Patient appears well-developed HEENT:  Head: Normocephalic Eyes:EOM are normal Neck: Normal range of motion Cardiovascular: Normal rate Pulmonary/chest: Effort normal Neurologic: Patient is alert Skin: Skin is warm Psychiatric: Patient has normal mood and affect   Ortho Exam: Ortho exam demonstrates full active and passive range of motion of the hips themselves.  She has 5 out of 5 hip abduction adduction and flexion strength.  Definite Trendelenburg gait with no ability to keep the pelvis level when standing on 1 foot.  No nerve root tension signs.  Ankle dorsiflexion plantarflexion strength 5+ out of 5 bilaterally.  Specialty Comments:  No specialty comments available.  Imaging: No results found.   PMFS History: Patient Active Problem List   Diagnosis Date Noted   Arthritis of right knee 11/27/2018   Unilateral primary osteoarthritis, right knee 07/05/2017    Class: Chronic   Anemia due to blood loss 07/05/2017    Class: Acute   Spinal stenosis, lumbar region with neurogenic claudication    Spondylolisthesis, lumbar region    S/P lumbar spinal fusion 07/03/2017   Mixed conductive and sensorineural hearing loss of left ear with restricted hearing of right ear 12/12/2016   Acute laryngitis 03/10/2016   Laryngopharyngeal reflux (LPR) 03/10/2016   Tympanic membrane perforation, right 03/10/2016   Past Medical History:  Diagnosis Date   Arthritis    Carpal tunnel syndrome    GERD (gastroesophageal reflux disease)    Hard of hearing    left side   Hypothyroid    Pneumonia    Varicose vein of leg     History reviewed. No pertinent family history.  Past Surgical History:  Procedure Laterality Date   ABDOMINAL HYSTERECTOMY     ABDOMINAL HYSTERECTOMY     APPENDECTOMY     BACK SURGERY     CARPAL TUNNEL RELEASE Left 06/26/2018   Procedure: CARPAL TUNNEL RELEASE;  Surgeon: Daryll Brod, MD;  Location: [facility];  Service: Orthopedics;  Laterality: Left;   CARPAL TUNNEL RELEASE Right 04/04/2019   Procedure: RIGHT CARPAL TUNNEL RELEASE;  Surgeon: Daryll Brod, MD;  Location: [facility];  Service: Orthopedics;  Laterality: Right;   cholesteatoma Left    COLON SURGERY     part of intestine removed   FOOT SURGERY Bilateral    IMPLANTATION BONE ANCHORED HEARING AID Left 01/2017   INJECTION KNEE Right 07/03/2017   Procedure: KNEE INJECTION;  Surgeon: Jessy Oto, MD;  Location: [facility];  Service: Orthopedics;  Laterality: Right;   KNEE ARTHROSCOPY Right    SPINAL FUSION     L4 and L5 for spinal stenosis , 06/2017   TONSILLECTOMY     TOTAL KNEE ARTHROPLASTY Right 11/27/2018   TOTAL KNEE ARTHROPLASTY Right 11/27/2018   Procedure: RIGHT TOTAL KNEE ARTHROPLASTY;  Surgeon: Meredith Pel, MD;  Location: [facility];  Service: Orthopedics;  Laterality: Right;   Social History   Occupational History   Not on file  Tobacco Use   Smoking status: Never   Smokeless tobacco: Never  Vaping Use   Vaping Use: Never used  Substance and Sexual Activity   Alcohol use: Yes    Alcohol/week: 1.0 standard drink    Types: 1 Glasses of wine per week    Comment:  drinks wine daily with dinner   Drug use: Never   Sexual activity: Yes    Birth control/protection: Surgical

## 2021-02-16 DIAGNOSIS — Z9181 History of falling: Secondary | ICD-10-CM | POA: Diagnosis not present

## 2021-02-16 DIAGNOSIS — Z0000 Encounter for general adult medical examination without abnormal findings: Secondary | ICD-10-CM | POA: Diagnosis not present

## 2021-02-16 DIAGNOSIS — Z79899 Other long term (current) drug therapy: Secondary | ICD-10-CM | POA: Diagnosis not present

## 2021-02-16 DIAGNOSIS — E039 Hypothyroidism, unspecified: Secondary | ICD-10-CM | POA: Diagnosis not present

## 2021-02-16 DIAGNOSIS — Z1211 Encounter for screening for malignant neoplasm of colon: Secondary | ICD-10-CM | POA: Diagnosis not present

## 2021-02-16 DIAGNOSIS — Z7189 Other specified counseling: Secondary | ICD-10-CM | POA: Diagnosis not present

## 2021-02-16 DIAGNOSIS — M1710 Unilateral primary osteoarthritis, unspecified knee: Secondary | ICD-10-CM | POA: Diagnosis not present

## 2021-02-16 DIAGNOSIS — E785 Hyperlipidemia, unspecified: Secondary | ICD-10-CM | POA: Diagnosis not present

## 2021-02-16 DIAGNOSIS — Z1389 Encounter for screening for other disorder: Secondary | ICD-10-CM | POA: Diagnosis not present

## 2021-02-16 DIAGNOSIS — Z6835 Body mass index (BMI) 35.0-35.9, adult: Secondary | ICD-10-CM | POA: Diagnosis not present

## 2021-02-16 DIAGNOSIS — Z1231 Encounter for screening mammogram for malignant neoplasm of breast: Secondary | ICD-10-CM | POA: Diagnosis not present

## 2021-02-16 DIAGNOSIS — Z1159 Encounter for screening for other viral diseases: Secondary | ICD-10-CM | POA: Diagnosis not present

## 2021-02-22 ENCOUNTER — Other Ambulatory Visit: Payer: Self-pay

## 2021-02-22 ENCOUNTER — Ambulatory Visit (INDEPENDENT_AMBULATORY_CARE_PROVIDER_SITE_OTHER): Payer: Medicare Other

## 2021-02-22 ENCOUNTER — Encounter: Payer: Self-pay | Admitting: Specialist

## 2021-02-22 ENCOUNTER — Ambulatory Visit (INDEPENDENT_AMBULATORY_CARE_PROVIDER_SITE_OTHER): Payer: Medicare Other | Admitting: Specialist

## 2021-02-22 VITALS — BP 134/85 | HR 73 | Ht 62.0 in | Wt 185.0 lb

## 2021-02-22 DIAGNOSIS — M4317 Spondylolisthesis, lumbosacral region: Secondary | ICD-10-CM

## 2021-02-22 DIAGNOSIS — M5440 Lumbago with sciatica, unspecified side: Secondary | ICD-10-CM | POA: Diagnosis not present

## 2021-02-22 DIAGNOSIS — M4726 Other spondylosis with radiculopathy, lumbar region: Secondary | ICD-10-CM

## 2021-02-22 DIAGNOSIS — Z981 Arthrodesis status: Secondary | ICD-10-CM | POA: Diagnosis not present

## 2021-02-22 DIAGNOSIS — M5136 Other intervertebral disc degeneration, lumbar region: Secondary | ICD-10-CM

## 2021-02-22 DIAGNOSIS — R29898 Other symptoms and signs involving the musculoskeletal system: Secondary | ICD-10-CM | POA: Diagnosis not present

## 2021-02-22 NOTE — Patient Instructions (Signed)
Avoid bending, stooping and avoid lifting weights greater than 10 lbs. Avoid prolong standing and walking. Avoid frequent bending and stooping  No lifting greater than 10 lbs. May use ice or moist heat for pain. Weight loss is of benefit. Handicap license is approved. Physical therapy for left hip abduction and left fooot DF strengthening and Williams flexion exercises.

## 2021-02-22 NOTE — Progress Notes (Signed)
Office Visit Note   Patient: [name]           Date of Birth: 10/28/1950           MRN: [id number] Visit Date: 02/22/2021              Requested by: Leeroy Cha, MD [address] [place],  [place] [address] PCP: Leeroy Cha, MD   Assessment & Plan: Visit Diagnoses:  1. Acute right-sided low back pain with sciatica, sciatica laterality unspecified   2. Degenerative disc disease, lumbar   3. S/P lumbar spinal fusion   4. Spondylolisthesis, lumbosacral region   5. Other spondylosis with radiculopathy, lumbar region   6. Weakness of left hip     Plan: Avoid bending, stooping and avoid lifting weights greater than 10 lbs. Avoid prolong standing and walking. Avoid frequent bending and stooping  No lifting greater than 10 lbs. May use ice or moist heat for pain. Weight loss is of benefit. Handicap license is approved. Physical therapy for left hip abduction and left fooot DF strengthening and Williams flexion exercises.   Follow-Up Instructions: Return in about 5 weeks (around 03/29/2021).   Orders:  Orders Placed This Encounter  Procedures   XR Lumbar Spine 2-3 Views   No orders of the defined types were placed in this encounter.     Procedures: No procedures performed   Clinical Data: No additional findings.   Subjective: Chief Complaint  Patient presents with   Lower Back - Pain    71 year old female with history of L4-5 TLIF for spondylolisthesis and stenosis in 06/2017, with good relief of back and leg pain on the right side but has had persistent left hip weakness. She is able to walk and grocery shop, walking more than a mile she notices weakness left hip and there is a lip. She has had the back surgery and right TKR and right  And left CTR. She is concerned as she still is weak in the left hip. Saw Dr. Marlou Sa and they had her undergo MRI of the lumbar spine.    Review of Systems  Constitutional: Negative.   HENT:  Negative.    Eyes: Negative.   Respiratory: Negative.    Cardiovascular: Negative.   Gastrointestinal: Negative.   Endocrine: Negative.   Genitourinary: Negative.   Musculoskeletal: Negative.   Skin: Negative.   Allergic/Immunologic: Negative.   Neurological: Negative.   Hematological: Negative.   Psychiatric/Behavioral: Negative.      Objective: Vital Signs: BP 134/85 (BP Location: Left Arm, Patient Position: Sitting)    Pulse 73    Ht 5\' 2"  (1.575 m)    Wt 185 lb (83.9 kg)    BMI 33.84 kg/m   Physical Exam Constitutional:      Appearance: She is well-developed.  HENT:     Head: Normocephalic and atraumatic.  Eyes:     Pupils: Pupils are equal, round, and reactive to light.  Pulmonary:     Effort: Pulmonary effort is normal.     Breath sounds: Normal breath sounds.  Abdominal:     General: Bowel sounds are normal.     Palpations: Abdomen is soft.  Musculoskeletal:     Cervical back: Normal range of motion and neck supple.     Lumbar back: Negative right straight leg raise test and negative left straight leg raise test.  Skin:    General: Skin is warm and dry.  Neurological:     Mental  Status: She is alert and oriented to person, place, and time.  Psychiatric:        Behavior: Behavior normal.        Thought Content: Thought content normal.        Judgment: Judgment normal.    Back Exam   Tenderness  The patient is experiencing tenderness in the lumbar.  Range of Motion  Extension:  abnormal  Lateral bend right:  normal  Lateral bend left:  abnormal  Rotation right:  normal  Rotation left:  abnormal   Muscle Strength  Right Quadriceps:  5/5  Left Quadriceps:  5/5  Right Hamstrings:  5/5  Left Hamstrings:  5/5   Tests  Straight leg raise right: negative Straight leg raise left: negative  Reflexes  Patellar:  2/4 Achilles:  2/4 Biceps:  2/4 Babinski's sign: normal   Other  Toe walk: normal Heel walk: abnormal Sensation: decreased  Comments:   Left hip abduction weakness 3/4 Left foot DF weakness 5-/5 Walking there is some mild left foot DF weakness, definite left gluteal lurch.     Specialty Comments:  No specialty comments available.  Imaging: XR Lumbar Spine 2-3 Views  Result Date: 02/22/2021 AP and lateral flexion and extension radiographs of the lumbar spine, show TLIF L4-5 with residual anterolisthesis grade 1, the L4 neuroforamen are well decompressed. There is severe DDD L5-S1 with grade 1 retrolisthesis L3-4 and L5-S1. Disc narrowing is present all lumbar levels. Vacuum sign L5-S1 with endplate sclerosis and anterior osteophytes.     PMFS History: Patient Active Problem List   Diagnosis Date Noted   Unilateral primary osteoarthritis, right knee 07/05/2017    Priority: High    Class: Chronic   Anemia due to blood loss 07/05/2017    Priority: High    Class: Acute   Arthritis of right knee 11/27/2018   Spinal stenosis, lumbar region with neurogenic claudication    Spondylolisthesis, lumbar region    S/P lumbar spinal fusion 07/03/2017   Mixed conductive and sensorineural hearing loss of left ear with restricted hearing of right ear 12/12/2016   Acute laryngitis 03/10/2016   Laryngopharyngeal reflux (LPR) 03/10/2016   Tympanic membrane perforation, right 03/10/2016   Past Medical History:  Diagnosis Date   Arthritis    Carpal tunnel syndrome    GERD (gastroesophageal reflux disease)    Hard of hearing    left side   Hypothyroid    Pneumonia    Varicose vein of leg     History reviewed. No pertinent family history.  Past Surgical History:  Procedure Laterality Date   ABDOMINAL HYSTERECTOMY     ABDOMINAL HYSTERECTOMY     APPENDECTOMY     BACK SURGERY     CARPAL TUNNEL RELEASE Left 06/26/2018   Procedure: CARPAL TUNNEL RELEASE;  Surgeon: Daryll Brod, MD;  Location: [facility];  Service: Orthopedics;  Laterality: Left;   CARPAL TUNNEL RELEASE Right 04/04/2019   Procedure: RIGHT CARPAL  TUNNEL RELEASE;  Surgeon: Daryll Brod, MD;  Location: [facility];  Service: Orthopedics;  Laterality: Right;   cholesteatoma Left    COLON SURGERY     part of intestine removed   FOOT SURGERY Bilateral    IMPLANTATION BONE ANCHORED HEARING AID Left 01/2017   INJECTION KNEE Right 07/03/2017   Procedure: KNEE INJECTION;  Surgeon: Jessy Oto, MD;  Location: [facility];  Service: Orthopedics;  Laterality: Right;   KNEE ARTHROSCOPY Right    SPINAL FUSION  L4 and L5 for spinal stenosis , 06/2017   TONSILLECTOMY     TOTAL KNEE ARTHROPLASTY Right 11/27/2018   TOTAL KNEE ARTHROPLASTY Right 11/27/2018   Procedure: RIGHT TOTAL KNEE ARTHROPLASTY;  Surgeon: Meredith Pel, MD;  Location: [facility];  Service: Orthopedics;  Laterality: Right;   Social History   Occupational History   Not on file  Tobacco Use   Smoking status: Never   Smokeless tobacco: Never  Vaping Use   Vaping Use: Never used  Substance and Sexual Activity   Alcohol use: Yes    Alcohol/week: 1.0 standard drink    Types: 1 Glasses of wine per week    Comment:  drinks wine daily with dinner   Drug use: Never   Sexual activity: Yes    Birth control/protection: Surgical

## 2021-03-01 ENCOUNTER — Encounter: Payer: Self-pay | Admitting: Physical Therapy

## 2021-03-01 ENCOUNTER — Ambulatory Visit (INDEPENDENT_AMBULATORY_CARE_PROVIDER_SITE_OTHER): Payer: Medicare Other | Admitting: Physical Therapy

## 2021-03-01 ENCOUNTER — Other Ambulatory Visit: Payer: Self-pay

## 2021-03-01 DIAGNOSIS — M545 Low back pain, unspecified: Secondary | ICD-10-CM

## 2021-03-01 DIAGNOSIS — M6281 Muscle weakness (generalized): Secondary | ICD-10-CM | POA: Diagnosis not present

## 2021-03-01 DIAGNOSIS — G8929 Other chronic pain: Secondary | ICD-10-CM | POA: Diagnosis not present

## 2021-03-01 DIAGNOSIS — R262 Difficulty in walking, not elsewhere classified: Secondary | ICD-10-CM

## 2021-03-01 NOTE — Therapy (Signed)
OUTPATIENT PHYSICAL THERAPY THORACOLUMBAR EVALUATION   Patient Name: [name] MRN: [id number] DOB:April 14, 1950, 71 y.o., female Today's Date: 03/01/2021    Past Medical History:  Diagnosis Date   Arthritis    Carpal tunnel syndrome    GERD (gastroesophageal reflux disease)    Hard of hearing    left side   Hypothyroid    Pneumonia    Varicose vein of leg    Past Surgical History:  Procedure Laterality Date   ABDOMINAL HYSTERECTOMY     ABDOMINAL HYSTERECTOMY     APPENDECTOMY     BACK SURGERY     CARPAL TUNNEL RELEASE Left 06/26/2018   Procedure: CARPAL TUNNEL RELEASE;  Surgeon: Daryll Brod, MD;  Location: [place];  Service: Orthopedics;  Laterality: Left;   CARPAL TUNNEL RELEASE Right 04/04/2019   Procedure: RIGHT CARPAL TUNNEL RELEASE;  Surgeon: Daryll Brod, MD;  Location: [place];  Service: Orthopedics;  Laterality: Right;   cholesteatoma Left    COLON SURGERY     part of intestine removed   FOOT SURGERY Bilateral    IMPLANTATION BONE ANCHORED HEARING AID Left 01/2017   INJECTION KNEE Right 07/03/2017   Procedure: KNEE INJECTION;  Surgeon: Jessy Oto, MD;  Location: [place];  Service: Orthopedics;  Laterality: Right;   KNEE ARTHROSCOPY Right    SPINAL FUSION     L4 and L5 for spinal stenosis , 06/2017   TONSILLECTOMY     TOTAL KNEE ARTHROPLASTY Right 11/27/2018   TOTAL KNEE ARTHROPLASTY Right 11/27/2018   Procedure: RIGHT TOTAL KNEE ARTHROPLASTY;  Surgeon: Meredith Pel, MD;  Location: [place];  Service: Orthopedics;  Laterality: Right;   Patient Active Problem List   Diagnosis Date Noted   Arthritis of right knee 11/27/2018   Unilateral primary osteoarthritis, right knee 07/05/2017    Class: Chronic   Anemia due to blood loss 07/05/2017    Class: Acute   Spinal stenosis, lumbar region with neurogenic claudication    Spondylolisthesis, lumbar region    S/P lumbar spinal fusion 07/03/2017   Mixed conductive and  sensorineural hearing loss of left ear with restricted hearing of right ear 12/12/2016   Acute laryngitis 03/10/2016   Laryngopharyngeal reflux (LPR) 03/10/2016   Tympanic membrane perforation, right 03/10/2016    PCP: Leeroy Cha, MD  REFERRING PROVIDER: Jessy Oto, MD  REFERRING DIAG: [phone] (ICD-10-CM) - Other spondylosis with radiculopathy, lumbar region R29.898 (ICD-10-CM) - Weakness of left hip   THERAPY DIAG:  No diagnosis found.  ONSET DATE: about 1 year  SUBJECTIVE:  SUBJECTIVE STATEMENT: Pt arriving today reporting pain in left sided low back and left hip. Pt amb with limb on left side. Pt stating pain can wake her up at times. Pt stating turing to the right is more painful. Pt stating walking is extremely difficult. Pt amb with no device. Pt stating her pain has been ongoing for about 1 year.  PERTINENT HISTORY:  Arthritis, carpal tunnel syndrome, GERD, HOH, varicose vein, TKA right knee 11/27/2018  PAIN:  Are you having pain? Yes NPRS scale: 1/10 Pain location: left sided low back pain which worsens during the day and worse with walking longer distances Pain orientation: Left lower PAIN TYPE: aching Pain description: intermittent  Aggravating factors: walking long distances Relieving factors: resting  PRECAUTIONS: None  WEIGHT BEARING RESTRICTIONS No  FALLS:  Has patient fallen in last 6 months? No, Number of falls: 0,  Pt stating she fell about 1 year ago onto her right side off her sister's deck.   LIVING ENVIRONMENT: Lives with: lives with their family and lives with their spouse Lives in: House/apartment Stairs: Yes; Internal: 1 flight steps; on left going up and External: 2 steps; none, hand hold grab handle Has following equipment at home:  None  OCCUPATION: retired Pharmacist, hospital, pt enjoys walking  PLOF: Independent  PATIENT GOALS stop hurting, walk better   OBJECTIVE:   DIAGNOSTIC FINDINGS:   XR Lumbar Spine 2-3 Views   Result Date: 02/22/2021 AP and lateral flexion and extension radiographs of the lumbar spine, show TLIF L4-5 with residual anterolisthesis grade 1, the L4 neuroforamen are well decompressed. There is severe DDD L5-S1 with grade 1 retrolisthesis L3-4 and L5-S1. Disc narrowing is present all lumbar levels. Vacuum sign L5-S1 with endplate sclerosis and anterior osteophytes  PATIENT SURVEYS:  03/01/2021 FOTO 45% (predicted: 53%)  SCREENING FOR RED FLAGS: Bowel or bladder incontinence: No   COGNITION:  Overall cognitive status: Within functional limits for tasks assessed     SENSATION:  Light touch: Appears intact    MUSCLE LENGTH: Hamstrings: Right 70 deg; Left 62 deg   POSTURE:  Rounded shoulders, forward head, decreased lumbar lordosis  PALPATION: TTP: lumbar paraspinals, left QL, left piriformis  LUMBARAROM/PROM  A/PROM AROM  03/01/2021  Flexion 54  Extension 15  Right lateral flexion 30  Left lateral flexion 22  Right rotation 50% limited  Left rotation 75% limited   (Blank rows = not tested)  LE AROM/PROM:  AROM Right 03/01/2021 Left 03/01/2021  Hip flexion 112 104 with pain noted  Hip extension 15 10  Hip abduction 35 20  Hip adduction    Hip internal rotation    Hip external rotation    Knee flexion    Knee extension    Ankle dorsiflexion    Ankle plantarflexion    Ankle inversion    Ankle eversion     (Blank rows = not tested)  LE MMT:  MMT Right 03/01/2021 Left 03/01/2021  Hip flexion 4/5 4-/5  Hip extension 4/5 4-/5  Hip abduction 4/5 4-/5  Hip adduction 4/5 4/5  Hip internal rotation    Hip external rotation    Knee flexion 5/5 5/5  Knee extension 5/5 5/5  Ankle dorsiflexion    Ankle plantarflexion    Ankle inversion    Ankle eversion     (Blank rows =  not tested)  LUMBAR SPECIAL TESTS:  Slump test: Negative, bilaterally SLR: negative bilaterally  FUNCTIONAL TESTS:  5 times sit to stand: 18 seconds no UE support  GAIT: Distance walked: 75 feet  Assistive device utilized: None Level of assistance: Complete Independence Comments: mild trendelenburg gait pattern on left     TODAY'S TREATMENT  03/01/2021 Bridges x 5 holding 5 seconds Clam shells 2 x10 Hamstring stretch: x 2 left LE and x1 on right LE holding 20 seconds  Trunk rotation: x 2 each side 20 second hold   PATIENT EDUCATION:  Education details: PT POC, HEP Person educated: Patient Education method: Explanation, Demonstration, Tactile cues, Verbal cues, and Handouts Education comprehension: verbalized understanding, returned demonstration, and needs further education   HOME EXERCISE PROGRAM: Access Code: [email or web address] URL: [email or web address] Date: 03/01/2021 Prepared by: Kearney Hard  Exercises Supine Bridge - 2 x daily - 2 sets - 10 reps - 5 seconds hold Hooklying Hamstring Stretch with Strap - 2 x daily - 3 reps - 30 seconds hold Clamshell - 2 x daily - 2 sets - 10 reps - 2-3 seconds hold Supine Lower Trunk Rotation - 2 x daily - 3 reps - 20-30 seconds hold   ASSESSMENT:  CLINICAL IMPRESSION: Patient is a 71 y.o. female who was seen today for physical therapy evaluation and treatment for low back pain and hip weakness. Pt arriving amb with no device and amb with trendelenburg gait pattern with left hip drop. Weakness noted in bilateral LE's with left sided weaker than right. Pt was issued a beginning HEP.    Pt reporting increased pain with amb. Pt Objective impairments include Abnormal gait, decreased balance, decreased endurance, decreased mobility, difficulty walking, decreased ROM, decreased strength, impaired flexibility, postural dysfunction, obesity, and pain. These impairments are limiting patient from community activity. Personal  factors including 3+ comorbidities: HTN, Rt TKA, arthritis, GERD, carpal tunnel, and HOH   are also affecting patient's functional outcome. Patient will benefit from skilled PT to address above impairments and improve overall function.  REHAB POTENTIAL: Good  CLINICAL DECISION MAKING: Stable/uncomplicated  EVALUATION COMPLEXITY: Low   GOALS: Goals reviewed with patient? Yes  SHORT TERM GOALS:  STG Name Target Date Goal status  1 Independent with initial HEP Baseline:  03/22/2021 INITIAL  2 Pt will improve her 5 time sit to stand </= 15 seconds with UE support.  Baseline:  03/22/2021 INITIAL        LONG TERM GOALS:   LTG Name Target Date Goal status  1 Independent with final HEP Baseline: 04/15/2021 INITIAL  2 FOTO score improved to 53% for improved function Baseline: 45% 04/15/2021 INITIAL  3 Pt will  improve her left hip strength to >/= 5/5 for improved function and gait.  Baseline: 04/15/2021 INITIAL  4 Report pain <  2/10 with amb >/= 1000 feet on community surfaces  Baseline: 04/15/2021 INITIAL  5  Baseline:    6  Baseline:    7  Baseline:       PLAN: PT FREQUENCY: 2x/week  PT DURATION: 6 weeks  PLANNED INTERVENTIONS: Therapeutic exercises, Therapeutic activity, Neuro Muscular re-education, Balance training, Gait training, Patient/Family education, Joint mobilization, Stair training, Dry Needling, Electrical stimulation, Spinal mobilization, Cryotherapy, Moist heat, Traction, Ultrasound, and Manual therapy  PLAN FOR NEXT SESSION: lumbar stretching, Nustep, lumbar mobs, left hip strengthening (glute med and min)   Oretha Caprice, PT, MPT 03/01/2021, 10:07 AM

## 2021-03-03 NOTE — Therapy (Signed)
OUTPATIENT PHYSICAL THERAPY TREATMENT NOTE   Patient Name: [name] MRN: [id number] DOB:1950-06-15, 71 y.o., female Today's Date: 03/04/2021  PCP: Leeroy Cha, MD REFERRING PROVIDER: Jessy Oto, MD   PT End of Session - 03/04/21 0934     Visit Number 2    Number of Visits 13    Date for PT Re-Evaluation 04/15/21    PT Start Time 0934    PT Stop Time 1016    PT Time Calculation (min) 42 min    Activity Tolerance Patient tolerated treatment well    Behavior During Therapy WFL for tasks assessed/performed             Past Medical History:  Diagnosis Date   Arthritis    Carpal tunnel syndrome    GERD (gastroesophageal reflux disease)    Hard of hearing    left side   Hypothyroid    Pneumonia    Varicose vein of leg    Past Surgical History:  Procedure Laterality Date   ABDOMINAL HYSTERECTOMY     ABDOMINAL HYSTERECTOMY     APPENDECTOMY     BACK SURGERY     CARPAL TUNNEL RELEASE Left 06/26/2018   Procedure: CARPAL TUNNEL RELEASE;  Surgeon: Daryll Brod, MD;  Location: [place];  Service: Orthopedics;  Laterality: Left;   CARPAL TUNNEL RELEASE Right 04/04/2019   Procedure: RIGHT CARPAL TUNNEL RELEASE;  Surgeon: Daryll Brod, MD;  Location: [place];  Service: Orthopedics;  Laterality: Right;   cholesteatoma Left    COLON SURGERY     part of intestine removed   FOOT SURGERY Bilateral    IMPLANTATION BONE ANCHORED HEARING AID Left 01/2017   INJECTION KNEE Right 07/03/2017   Procedure: KNEE INJECTION;  Surgeon: Jessy Oto, MD;  Location: [place];  Service: Orthopedics;  Laterality: Right;   KNEE ARTHROSCOPY Right    SPINAL FUSION     L4 and L5 for spinal stenosis , 06/2017   TONSILLECTOMY     TOTAL KNEE ARTHROPLASTY Right 11/27/2018   TOTAL KNEE ARTHROPLASTY Right 11/27/2018   Procedure: RIGHT TOTAL KNEE ARTHROPLASTY;  Surgeon: Meredith Pel, MD;  Location: [place];  Service: Orthopedics;  Laterality: Right;    Patient Active Problem List   Diagnosis Date Noted   Arthritis of right knee 11/27/2018   Unilateral primary osteoarthritis, right knee 07/05/2017    Class: Chronic   Anemia due to blood loss 07/05/2017    Class: Acute   Spinal stenosis, lumbar region with neurogenic claudication    Spondylolisthesis, lumbar region    S/P lumbar spinal fusion 07/03/2017   Mixed conductive and sensorineural hearing loss of left ear with restricted hearing of right ear 12/12/2016   Acute laryngitis 03/10/2016   Laryngopharyngeal reflux (LPR) 03/10/2016   Tympanic membrane perforation, right 03/10/2016   REFERRING Provider: Jessy Oto, MD  REFERRING DIAG: [phone] (ICD-10-CM) - Other spondylosis with radiculopathy, lumbar region R29.898 (ICD-10-CM) - Weakness of left hip   THERAPY DIAG:  Chronic left-sided low back pain without sciatica  Muscle weakness (generalized)  Difficulty in walking, not elsewhere classified  PERTINENT HISTORY: Arthritis, carpal tunnel syndrome, GERD, HOH, varicose vein, TKA right knee 11/27/2018   PRECAUTIONS: none  SUBJECTIVE: Just stiff in the morning  PAIN:  Are you having pain? No NPRS scale: 0/10 Pain location:  Pain orientation:   PAIN TYPE:  Pain description:   Aggravating factors:  Relieving factors:        OBJECTIVE:  DIAGNOSTIC FINDINGS:    XR Lumbar Spine 2-3 Views   Result Date: 02/22/2021 AP and lateral flexion and extension radiographs of the lumbar spine, show TLIF L4-5 with residual anterolisthesis grade 1, the L4 neuroforamen are well decompressed. There is severe DDD L5-S1 with grade 1 retrolisthesis L3-4 and L5-S1. Disc narrowing is present all lumbar levels. Vacuum sign L5-S1 with endplate sclerosis and anterior osteophytes   PATIENT SURVEYS:  03/01/2021 FOTO 45% (predicted: 53%)        MUSCLE LENGTH: 03/01/21: Hamstrings: Right 70 deg; Left 62 deg   LUMBARAROM/PROM   A/PROM AROM  03/01/2021  Flexion 54  Extension 15   Right lateral flexion 30  Left lateral flexion 22  Right rotation 50% limited  Left rotation 75% limited   (Blank rows = not tested)   LE AROM/PROM:   AROM Right 03/01/2021 Left 03/01/2021  Hip flexion 112 104 with pain noted  Hip extension 15 10  Hip abduction 35 20  Hip adduction      Hip internal rotation      Hip external rotation      Knee flexion      Knee extension      Ankle dorsiflexion      Ankle plantarflexion      Ankle inversion      Ankle eversion       (Blank rows = not tested)   LE MMT:   MMT Right 03/01/2021 Left 03/01/2021  Hip flexion 4/5 4-/5  Hip extension 4/5 4-/5  Hip abduction 4/5 4-/5  Hip adduction 4/5 4/5  Hip internal rotation      Hip external rotation      Knee flexion 5/5 5/5  Knee extension 5/5 5/5  Ankle dorsiflexion      Ankle plantarflexion      Ankle inversion      Ankle eversion       (Blank rows = not tested)  FUNCTIONAL TESTS:  03/01/21: 5 times sit to stand: 18 seconds no UE support     TODAY'S TREATMENT  03/04/21 Therapeutic Exercise:  Aerobic: Nustep L5 x 6 min Supine:Bridge 5 sec x 10; HS stretch with strap 2x 30 sec ea; Trunk rotation: x 5 each side 20 second hold Prone:  Seated:  Standing: Hip ABD, ext 2x10 bil cues to avoid hip ER bil; forward step up with R hip flexion x 10 6 inch step (started on floor and 4 inch x 5 ea)  Sidelying: clam 5 sec hold 2 x 10 bil; attempted hip ABD L but too difficult; Hip ABD R x 2 x 10;    03/01/2021 Bridges x 5 holding 5 seconds Clam shells 2 x10 Hamstring stretch: x 2 left LE and x1 on right LE holding 20 seconds  Trunk rotation: x 2 each side 20 second hold     PATIENT EDUCATION:  Education details: HEP Person educated: Patient Education method: Consulting civil engineer, Demonstration, Corporate treasurer cues, Verbal cues, and Handouts Education comprehension: verbalized understanding, returned demonstration, and needs further education     HOME EXERCISE PROGRAM:    Access Code:  [email or web address] URL: [email or web address] Date: 03/04/2021 Prepared by: Almyra Free  Exercises Supine Bridge - 2 x daily - 2 sets - 10 reps - 5 seconds hold Hooklying Hamstring Stretch with Strap - 2 x daily - 3 reps - 30 seconds hold Clamshell - 2 x daily - 2 sets - 10 reps - 2-3 seconds hold Supine Lower Trunk Rotation - 2 x daily - 3 reps - 20-30  seconds hold Runner's Step Up/Down - 1 x daily - 7 x weekly - 2 sets - 10 reps Standing Hip Abduction with Counter Support - 1 x daily - 7 x weekly - 2 sets - 10 reps  ASSESSMENT:   CLINICAL IMPRESSION: Abbie did very well with TE today with no complaints of pain. She needs VCs to slow down and to ensure good form and then is able to self correct on her own. She is compliant with HEP.    REHAB POTENTIAL: Good   CLINICAL DECISION MAKING: Stable/uncomplicated   EVALUATION COMPLEXITY: Low     GOALS: Goals reviewed with patient? Yes   SHORT TERM GOALS:   STG Name Target Date Goal status  1 Independent with initial HEP Baseline:  03/22/2021 INITIAL  2 Pt will improve her 5 time sit to stand </= 15 seconds with UE support.  Baseline:  03/22/2021 INITIAL             LONG TERM GOALS:    LTG Name Target Date Goal status  1 Independent with final HEP Baseline: 04/15/2021 INITIAL  2 FOTO score improved to 53% for improved function Baseline: 45% 04/15/2021 INITIAL  3 Pt will  improve her left hip strength to >/= 5/5 for improved function and gait.  Baseline: 04/15/2021 INITIAL  4 Report pain <  2/10 with amb >/= 1000 feet on community surfaces  Baseline: 04/15/2021 INITIAL  5   Baseline:      6   Baseline:      7   Baseline:            PLAN: PT FREQUENCY: 2x/week   PT DURATION: 6 weeks   PLANNED INTERVENTIONS: Therapeutic exercises, Therapeutic activity, Neuro Muscular re-education, Balance training, Gait training, Patient/Family education, Joint mobilization, Stair training, Dry Needling, Electrical stimulation, Spinal  mobilization, Cryotherapy, Moist heat, Traction, Ultrasound, and Manual therapy   PLAN FOR NEXT SESSION: lumbar stretching, Nustep, lumbar mobs, left hip strengthening (glute med and min)     Lowanda Cashaw, PT 03/04/2021, 3:55 PM

## 2021-03-04 ENCOUNTER — Other Ambulatory Visit: Payer: Self-pay

## 2021-03-04 ENCOUNTER — Ambulatory Visit (INDEPENDENT_AMBULATORY_CARE_PROVIDER_SITE_OTHER): Payer: Medicare Other | Admitting: Physical Therapy

## 2021-03-04 ENCOUNTER — Encounter: Payer: Self-pay | Admitting: Physical Therapy

## 2021-03-04 DIAGNOSIS — R262 Difficulty in walking, not elsewhere classified: Secondary | ICD-10-CM

## 2021-03-04 DIAGNOSIS — M545 Low back pain, unspecified: Secondary | ICD-10-CM

## 2021-03-04 DIAGNOSIS — G8929 Other chronic pain: Secondary | ICD-10-CM | POA: Diagnosis not present

## 2021-03-04 DIAGNOSIS — M6281 Muscle weakness (generalized): Secondary | ICD-10-CM | POA: Diagnosis not present

## 2021-03-08 ENCOUNTER — Encounter: Payer: Self-pay | Admitting: Physical Therapy

## 2021-03-08 ENCOUNTER — Ambulatory Visit (INDEPENDENT_AMBULATORY_CARE_PROVIDER_SITE_OTHER): Payer: Medicare Other | Admitting: Physical Therapy

## 2021-03-08 ENCOUNTER — Other Ambulatory Visit: Payer: Self-pay

## 2021-03-08 DIAGNOSIS — G8929 Other chronic pain: Secondary | ICD-10-CM | POA: Diagnosis not present

## 2021-03-08 DIAGNOSIS — M545 Low back pain, unspecified: Secondary | ICD-10-CM

## 2021-03-08 DIAGNOSIS — R262 Difficulty in walking, not elsewhere classified: Secondary | ICD-10-CM

## 2021-03-08 DIAGNOSIS — M6281 Muscle weakness (generalized): Secondary | ICD-10-CM

## 2021-03-08 NOTE — Therapy (Signed)
OUTPATIENT PHYSICAL THERAPY TREATMENT NOTE   Patient Name: [name] MRN: [id number] DOB:[birth year], 71 y.o., female Today's Date: 03/08/2021  PCP: Leeroy Cha, MD REFERRING PROVIDER: Jessy Oto, MD   PT End of Session - 03/08/21 1421     Visit Number 3    Number of Visits 13    Date for PT Re-Evaluation 04/15/21    PT Start Time 8185    PT Stop Time 1426    PT Time Calculation (min) 38 min    Activity Tolerance Patient tolerated treatment well    Behavior During Therapy WFL for tasks assessed/performed              Past Medical History:  Diagnosis Date   Arthritis    Carpal tunnel syndrome    GERD (gastroesophageal reflux disease)    Hard of hearing    left side   Hypothyroid    Pneumonia    Varicose vein of leg    Past Surgical History:  Procedure Laterality Date   ABDOMINAL HYSTERECTOMY     ABDOMINAL HYSTERECTOMY     APPENDECTOMY     BACK SURGERY     CARPAL TUNNEL RELEASE Left 06/26/2018   Procedure: CARPAL TUNNEL RELEASE;  Surgeon: Daryll Brod, MD;  Location: [place];  Service: Orthopedics;  Laterality: Left;   CARPAL TUNNEL RELEASE Right 04/04/2019   Procedure: RIGHT CARPAL TUNNEL RELEASE;  Surgeon: Daryll Brod, MD;  Location: [place];  Service: Orthopedics;  Laterality: Right;   cholesteatoma Left    COLON SURGERY     part of intestine removed   FOOT SURGERY Bilateral    IMPLANTATION BONE ANCHORED HEARING AID Left 01/2017   INJECTION KNEE Right 07/03/2017   Procedure: KNEE INJECTION;  Surgeon: Jessy Oto, MD;  Location: [place];  Service: Orthopedics;  Laterality: Right;   KNEE ARTHROSCOPY Right    SPINAL FUSION     L4 and L5 for spinal stenosis , 06/2017   TONSILLECTOMY     TOTAL KNEE ARTHROPLASTY Right 11/27/2018   TOTAL KNEE ARTHROPLASTY Right 11/27/2018   Procedure: RIGHT TOTAL KNEE ARTHROPLASTY;  Surgeon: Meredith Pel, MD;  Location: [place];  Service: Orthopedics;  Laterality:  Right;   Patient Active Problem List   Diagnosis Date Noted   Arthritis of right knee 11/27/2018   Unilateral primary osteoarthritis, right knee 07/05/2017    Class: Chronic   Anemia due to blood loss 07/05/2017    Class: Acute   Spinal stenosis, lumbar region with neurogenic claudication    Spondylolisthesis, lumbar region    S/P lumbar spinal fusion 07/03/2017   Mixed conductive and sensorineural hearing loss of left ear with restricted hearing of right ear 12/12/2016   Acute laryngitis 03/10/2016   Laryngopharyngeal reflux (LPR) 03/10/2016   Tympanic membrane perforation, right 03/10/2016   REFERRING Provider: Jessy Oto, MD  REFERRING DIAG: [phone] (ICD-10-CM) - Other spondylosis with radiculopathy, lumbar region R29.898 (ICD-10-CM) - Weakness of left hip   THERAPY DIAG:  Chronic left-sided low back pain without sciatica  Muscle weakness (generalized)  Difficulty in walking, not elsewhere classified  PERTINENT HISTORY: Arthritis, carpal tunnel syndrome, GERD, HOH, varicose vein, TKA right knee 11/27/2018   PRECAUTIONS: none  SUBJECTIVE: Pt arriving reporting no pain at rest. Pt stating she has had out of town visitors and hasn't been able to perform her exercises.   PAIN:  Are you having pain? No NPRS scale: 0/10 Pain location:  Pain orientation:  PAIN TYPE:  Pain description:   Aggravating factors:  Relieving factors:        OBJECTIVE:    DIAGNOSTIC FINDINGS:    XR Lumbar Spine 2-3 Views   Result Date: 02/22/2021 AP and lateral flexion and extension radiographs of the lumbar spine, show TLIF L4-5 with residual anterolisthesis grade 1, the L4 neuroforamen are well decompressed. There is severe DDD L5-S1 with grade 1 retrolisthesis L3-4 and L5-S1. Disc narrowing is present all lumbar levels. Vacuum sign L5-S1 with endplate sclerosis and anterior osteophytes   PATIENT SURVEYS:  03/01/2021 FOTO 45% (predicted: 53%)        MUSCLE LENGTH: 03/01/21:  Hamstrings: Right 70 deg; Left 62 deg   LUMBARAROM/PROM   A/PROM AROM  03/01/2021  Flexion 54  Extension 15  Right lateral flexion 30  Left lateral flexion 22  Right rotation 50% limited  Left rotation 75% limited   (Blank rows = not tested)   LE AROM/PROM:   AROM Right 03/01/2021 Left 03/01/2021  Hip flexion 112 104 with pain noted  Hip extension 15 10  Hip abduction 35 20  Hip adduction      Hip internal rotation      Hip external rotation      Knee flexion      Knee extension      Ankle dorsiflexion      Ankle plantarflexion      Ankle inversion      Ankle eversion       (Blank rows = not tested)   LE MMT:   MMT Right 03/01/2021 Left 03/01/2021  Hip flexion 4/5 4-/5  Hip extension 4/5 4-/5  Hip abduction 4/5 4-/5  Hip adduction 4/5 4/5  Hip internal rotation      Hip external rotation      Knee flexion 5/5 5/5  Knee extension 5/5 5/5  Ankle dorsiflexion      Ankle plantarflexion      Ankle inversion      Ankle eversion       (Blank rows = not tested)  FUNCTIONAL TESTS:  03/01/21: 5 times sit to stand: 18 seconds no UE support    Today's Treatment: 03/08/2021 Therapeutic Exercise:  Aerobic: Nustep L5 x 8 minutes Supine: trunk rotation x 3 each side holding 20 seconds       Bridges 2x10 holding 3-5 seconds each Prone:  Seated: hamstring stretch x 2 each LE holding 30 seconds  Standing: hip abd 2x10 each LE   Step up on 6 inch step 2x10 each LE   Hip extension: x 10   Hip hiking x 10 each LE with UE support  Sidelying: hip abduction with minimal lift and cues to keep toes pointing forward to prevent compensation. 2x10 on each LE  Neuromuscular Re-education: Airex with wt shifting Manual Therapy: Therapeutic Activity: Self Care: Trigger Point Dry Needling:  Modalities:    TODAY'S TREATMENT   03/04/21 Therapeutic Exercise:  Aerobic: Nustep L5 x 6 min Supine:Bridge 5 sec x 10; HS stretch with strap 2x 30 sec ea; Trunk rotation: x 5 each side 20  second hold Prone:  Seated:  Standing: Hip ABD, ext 2x10 bil cues to avoid hip ER bil; forward step up with R hip flexion x 10 6 inch step (started on floor and 4 inch x 5 ea)  Sidelying: clam 5 sec hold 2 x 10 bil; attempted hip ABD L but too difficult; Hip ABD R x 2 x 10;    03/01/2021 Bridges x  5 holding 5 seconds Clam shells 2 x10 Hamstring stretch: x 2 left LE and x1 on right LE holding 20 seconds  Trunk rotation: x 2 each side 20 second hold     PATIENT EDUCATION:  Education details: HEP Person educated: Patient Education method: Explanation, Demonstration, Tactile cues, Verbal cues, and Handouts Education comprehension: verbalized understanding, returned demonstration, and needs further education     HOME EXERCISE PROGRAM:    Access Code: [email or web address] URL: [email or web address] Date: 03/04/2021 Prepared by: Almyra Free  Exercises Supine Bridge - 2 x daily - 2 sets - 10 reps - 5 seconds hold Hooklying Hamstring Stretch with Strap - 2 x daily - 3 reps - 30 seconds hold Clamshell - 2 x daily - 2 sets - 10 reps - 2-3 seconds hold Supine Lower Trunk Rotation - 2 x daily - 3 reps - 20-30 seconds hold Runner's Step Up/Down - 1 x daily - 7 x weekly - 2 sets - 10 reps Standing Hip Abduction with Counter Support - 1 x daily - 7 x weekly - 2 sets - 10 reps  ASSESSMENT:   CLINICAL IMPRESSION: Pt tolerating all exercises well with instructions for technique and form to prevent compensation. Pt with limitations in bilateral hip strength more in abduction. Pt stating due to out of town visitors at her home she has not been compliant in her HEP. Recommending continued skilled PT to maximize pt's function.     REHAB POTENTIAL: Good   CLINICAL DECISION MAKING: Stable/uncomplicated   EVALUATION COMPLEXITY: Low     GOALS: Goals reviewed with patient? Yes   SHORT TERM GOALS:   STG Name Target Date Goal status  1 Independent with initial HEP Baseline:  03/22/2021  Ongoing 03/08/2021  2 Pt will improve her 5 time sit to stand </= 15 seconds with UE support.  Baseline:  03/22/2021 Ongoing 03/08/2021             LONG TERM GOALS:    LTG Name Target Date Goal status  1 Independent with final HEP Baseline: 04/15/2021 INITIAL  2 FOTO score improved to 53% for improved function Baseline: 45% 04/15/2021 INITIAL  3 Pt will  improve her left hip strength to >/= 5/5 for improved function and gait.  Baseline: 04/15/2021 INITIAL  4 Report pain <  2/10 with amb >/= 1000 feet on community surfaces  Baseline: 04/15/2021 INITIAL  5   Baseline:      6   Baseline:      7   Baseline:            PLAN: PT FREQUENCY: 2x/week   PT DURATION: 6 weeks   PLANNED INTERVENTIONS: Therapeutic exercises, Therapeutic activity, Neuro Muscular re-education, Balance training, Gait training, Patient/Family education, Joint mobilization, Stair training, Dry Needling, Electrical stimulation, Spinal mobilization, Cryotherapy, Moist heat, Traction, Ultrasound, and Manual therapy   PLAN FOR NEXT SESSION: lumbar stretching, Nustep, lumbar mobs, left hip strengthening (glute med and min)     Oretha Caprice, PT, MPT 03/08/2021, 2:24 PM

## 2021-03-10 ENCOUNTER — Encounter: Payer: Self-pay | Admitting: Physical Therapy

## 2021-03-10 ENCOUNTER — Other Ambulatory Visit: Payer: Self-pay

## 2021-03-10 ENCOUNTER — Ambulatory Visit (INDEPENDENT_AMBULATORY_CARE_PROVIDER_SITE_OTHER): Payer: Medicare Other | Admitting: Physical Therapy

## 2021-03-10 DIAGNOSIS — R262 Difficulty in walking, not elsewhere classified: Secondary | ICD-10-CM

## 2021-03-10 DIAGNOSIS — M6281 Muscle weakness (generalized): Secondary | ICD-10-CM

## 2021-03-10 DIAGNOSIS — M545 Low back pain, unspecified: Secondary | ICD-10-CM

## 2021-03-10 DIAGNOSIS — G8929 Other chronic pain: Secondary | ICD-10-CM

## 2021-03-10 NOTE — Therapy (Signed)
OUTPATIENT PHYSICAL THERAPY TREATMENT NOTE   Patient Name: [name] MRN: [id number] DOB:05-04-1950, 71 y.o., female Today's Date: 03/10/2021  PCP: Leeroy Cha, MD REFERRING PROVIDER: Jessy Oto, MD   PT End of Session - 03/10/21 0935     Visit Number 4    Number of Visits 13    Date for PT Re-Evaluation 04/15/21    PT Start Time 0800    PT Stop Time 0845    PT Time Calculation (min) 45 min    Activity Tolerance Patient tolerated treatment well    Behavior During Therapy WFL for tasks assessed/performed               Past Medical History:  Diagnosis Date   Arthritis    Carpal tunnel syndrome    GERD (gastroesophageal reflux disease)    Hard of hearing    left side   Hypothyroid    Pneumonia    Varicose vein of leg    Past Surgical History:  Procedure Laterality Date   ABDOMINAL HYSTERECTOMY     ABDOMINAL HYSTERECTOMY     APPENDECTOMY     BACK SURGERY     CARPAL TUNNEL RELEASE Left 06/26/2018   Procedure: CARPAL TUNNEL RELEASE;  Surgeon: Daryll Brod, MD;  Location: [place];  Service: Orthopedics;  Laterality: Left;   CARPAL TUNNEL RELEASE Right 04/04/2019   Procedure: RIGHT CARPAL TUNNEL RELEASE;  Surgeon: Daryll Brod, MD;  Location: [place];  Service: Orthopedics;  Laterality: Right;   cholesteatoma Left    COLON SURGERY     part of intestine removed   FOOT SURGERY Bilateral    IMPLANTATION BONE ANCHORED HEARING AID Left 01/2017   INJECTION KNEE Right 07/03/2017   Procedure: KNEE INJECTION;  Surgeon: Jessy Oto, MD;  Location: [place];  Service: Orthopedics;  Laterality: Right;   KNEE ARTHROSCOPY Right    SPINAL FUSION     L4 and L5 for spinal stenosis , 06/2017   TONSILLECTOMY     TOTAL KNEE ARTHROPLASTY Right 11/27/2018   TOTAL KNEE ARTHROPLASTY Right 11/27/2018   Procedure: RIGHT TOTAL KNEE ARTHROPLASTY;  Surgeon: Meredith Pel, MD;  Location: [place];  Service: Orthopedics;  Laterality:  Right;   Patient Active Problem List   Diagnosis Date Noted   Arthritis of right knee 11/27/2018   Unilateral primary osteoarthritis, right knee 07/05/2017    Class: Chronic   Anemia due to blood loss 07/05/2017    Class: Acute   Spinal stenosis, lumbar region with neurogenic claudication    Spondylolisthesis, lumbar region    S/P lumbar spinal fusion 07/03/2017   Mixed conductive and sensorineural hearing loss of left ear with restricted hearing of right ear 12/12/2016   Acute laryngitis 03/10/2016   Laryngopharyngeal reflux (LPR) 03/10/2016   Tympanic membrane perforation, right 03/10/2016   REFERRING Provider: Jessy Oto, MD  REFERRING DIAG: [phone] (ICD-10-CM) - Other spondylosis with radiculopathy, lumbar region R29.898 (ICD-10-CM) - Weakness of left hip   THERAPY DIAG:  Chronic left-sided low back pain without sciatica  Muscle weakness (generalized)  Difficulty in walking, not elsewhere classified  PERTINENT HISTORY: Arthritis, carpal tunnel syndrome, GERD, HOH, varicose vein, TKA right knee 11/27/2018   PRECAUTIONS: none  SUBJECTIVE: Pt. Reports 1/10 pain today in the low back and Lt hip. She reports that she was not able to perform her HEP because she had guests coming in from out of town.   PAIN:  Are you having pain? No  NPRS scale: 1/10 Pain location:  Pain orientation:   PAIN TYPE:  Pain description:   Aggravating factors:  Relieving factors:        OBJECTIVE:    DIAGNOSTIC FINDINGS:    XR Lumbar Spine 2-3 Views   Result Date: 02/22/2021 AP and lateral flexion and extension radiographs of the lumbar spine, show TLIF L4-5 with residual anterolisthesis grade 1, the L4 neuroforamen are well decompressed. There is severe DDD L5-S1 with grade 1 retrolisthesis L3-4 and L5-S1. Disc narrowing is present all lumbar levels. Vacuum sign L5-S1 with endplate sclerosis and anterior osteophytes   PATIENT SURVEYS:  03/01/2021 FOTO 45% (predicted: 53%)         MUSCLE LENGTH: 03/01/21: Hamstrings: Right 70 deg; Left 62 deg   LUMBARAROM/PROM   A/PROM AROM  03/01/2021  Flexion 54  Extension 15  Right lateral flexion 30  Left lateral flexion 22  Right rotation 50% limited  Left rotation 75% limited   (Blank rows = not tested)   LE AROM/PROM:   AROM Right 03/01/2021 Left 03/01/2021  Hip flexion 112 104 with pain noted  Hip extension 15 10  Hip abduction 35 20  Hip adduction      Hip internal rotation      Hip external rotation      Knee flexion      Knee extension      Ankle dorsiflexion      Ankle plantarflexion      Ankle inversion      Ankle eversion       (Blank rows = not tested)   LE MMT:   MMT Right 03/01/2021 Left 03/01/2021  Hip flexion 4/5 4-/5  Hip extension 4/5 4-/5  Hip abduction 4/5 4-/5  Hip adduction 4/5 4/5  Hip internal rotation      Hip external rotation      Knee flexion 5/5 5/5  Knee extension 5/5 5/5  Ankle dorsiflexion      Ankle plantarflexion      Ankle inversion      Ankle eversion       (Blank rows = not tested)  FUNCTIONAL TESTS:  03/01/21: 5 times sit to stand: 18 seconds no UE support    Today's Treatment:   03/10/2021   Therapeutic Exercise:  Aerobic: Nustep L5 x 8 minutes  Supine: Bridges with ball squeeze 2 x 10, 3 sec hold, bilat LE on pball rolling into hip flexion x 15 10 sec. Hold; hamstring stretch with strap 2 x 30 sec. bilat Prone:  Seated: Green ball rollouts into lumbar flexion x 10 each middle, right, and left;   Standing:  2 UE support at countertop hip abduction and extension x 15 bilat 2.5#, Hamstring curls 2.5# 2 x 10 bilat; Hip hiking x 10 each LE; 1 UE support marching at counter top 2.5# 2 x 10 bilat;  Neuromuscular Re-education: Airex with lateral wt shifting 3 x 30 sec.  Manual Therapy: Therapeutic Activity: Self Care: Trigger Point Dry Needling:  Modalities:       03/08/2021 Therapeutic Exercise:  Aerobic: Nustep L5 x 8 minutes Supine: trunk rotation x 3  each side holding 20 seconds       Bridges 2x10 holding 3-5 seconds each Prone:  Seated: hamstring stretch x 2 each LE holding 30 seconds  Standing: hip abd 2x10 each LE   Step up on 6 inch step 2x10 each LE   Hip extension: x 10   Hip hiking x 10 each LE with UE  support  Sidelying: hip abduction with minimal lift and cues to keep toes pointing forward to prevent compensation. 2x10 on each LE  Neuromuscular Re-education: Airex with wt shifting Manual Therapy: Therapeutic Activity: Self Care: Trigger Point Dry Needling:  Modalities:    TODAY'S TREATMENT   03/04/21 Therapeutic Exercise:  Aerobic: Nustep L5 x 6 min Supine:Bridge 5 sec x 10; HS stretch with strap 2x 30 sec ea; Trunk rotation: x 5 each side 20 second hold Prone:  Seated:  Standing: Hip ABD, ext 2x10 bil cues to avoid hip ER bil; forward step up with R hip flexion x 10 6 inch step (started on floor and 4 inch x 5 ea)  Sidelying: clam 5 sec hold 2 x 10 bil; attempted hip ABD L but too difficult; Hip ABD R x 2 x 10;       PATIENT EDUCATION:  Education details: HEP Person educated: Patient Education method: Explanation, Demonstration, Tactile cues, Verbal cues, and Handouts Education comprehension: verbalized understanding, returned demonstration, and needs further education     HOME EXERCISE PROGRAM:    Access Code: [email or web address] URL: [email or web address] Date: 03/04/2021 Prepared by: Almyra Free  Exercises Supine Bridge - 2 x daily - 2 sets - 10 reps - 5 seconds hold Hooklying Hamstring Stretch with Strap - 2 x daily - 3 reps - 30 seconds hold Clamshell - 2 x daily - 2 sets - 10 reps - 2-3 seconds hold Supine Lower Trunk Rotation - 2 x daily - 3 reps - 20-30 seconds hold Runner's Step Up/Down - 1 x daily - 7 x weekly - 2 sets - 10 reps Standing Hip Abduction with Counter Support - 1 x daily - 7 x weekly - 2 sets - 10 reps  ASSESSMENT:   CLINICAL IMPRESSION: Session focused on Lt hip strengthening  and increasing ROM of the lumbar spine. Pt tolerated all exercises well today, and is showing improvements with Lt hip strength as seen with the addition of 2.5# ankle weights added to hip strengthening exercises. Pt was educated on possible soreness that may be experienced from additional exercises being added to her treatment today.    REHAB POTENTIAL: Good   CLINICAL DECISION MAKING: Stable/uncomplicated   EVALUATION COMPLEXITY: Low     GOALS: Goals reviewed with patient? Yes   SHORT TERM GOALS:   STG Name Target Date Goal status  1 Independent with initial HEP Baseline:  03/22/2021 Ongoing 03/08/2021  2 Pt will improve her 5 time sit to stand </= 15 seconds with UE support.  Baseline:  03/22/2021 Ongoing 03/08/2021             LONG TERM GOALS:    LTG Name Target Date Goal status  1 Independent with final HEP Baseline: 04/15/2021 On-going  2 FOTO score improved to 53% for improved function Baseline: 45% 04/15/2021 On-going  3 Pt will  improve her left hip strength to >/= 5/5 for improved function and gait.  Baseline: 04/15/2021 On-going  4 Report pain <  2/10 with amb >/= 1000 feet on community surfaces  Baseline: 04/15/2021 On-going  5   Baseline:      6   Baseline:      7   Baseline:            PLAN: PT FREQUENCY: 2x/week   PT DURATION: 6 weeks   PLANNED INTERVENTIONS: Therapeutic exercises, Therapeutic activity, Neuro Muscular re-education, Balance training, Gait training, Patient/Family education, Joint mobilization, Stair training, Dry Needling, Electrical stimulation, Spinal mobilization, Cryotherapy, Moist heat,  Traction, Ultrasound, and Manual therapy   PLAN FOR NEXT SESSION: Continue to progress hip strengthening and lumbar ROM exercises as tolerated.  Lismary Kiehn Singer, Student-PT 03/10/2021, 9:36 AM

## 2021-03-15 ENCOUNTER — Encounter: Payer: Self-pay | Admitting: Physical Therapy

## 2021-03-15 ENCOUNTER — Other Ambulatory Visit: Payer: Self-pay

## 2021-03-15 ENCOUNTER — Ambulatory Visit (INDEPENDENT_AMBULATORY_CARE_PROVIDER_SITE_OTHER): Payer: Medicare Other | Admitting: Physical Therapy

## 2021-03-15 DIAGNOSIS — G8929 Other chronic pain: Secondary | ICD-10-CM

## 2021-03-15 DIAGNOSIS — M6281 Muscle weakness (generalized): Secondary | ICD-10-CM

## 2021-03-15 DIAGNOSIS — M545 Low back pain, unspecified: Secondary | ICD-10-CM | POA: Diagnosis not present

## 2021-03-15 DIAGNOSIS — R262 Difficulty in walking, not elsewhere classified: Secondary | ICD-10-CM | POA: Diagnosis not present

## 2021-03-15 NOTE — Therapy (Signed)
OUTPATIENT PHYSICAL THERAPY TREATMENT NOTE   Patient Name: [name] MRN: [id number] DOB:24-Jun-1950, 71 y.o., female Today's Date: 03/15/2021  PCP: Leeroy Cha, MD REFERRING PROVIDER: Jessy Oto, MD   PT End of Session - 03/15/21 1517     Visit Number 5    Number of Visits 13    Date for PT Re-Evaluation 04/15/21    PT Start Time 4580    PT Stop Time 1600    PT Time Calculation (min) 45 min    Activity Tolerance Patient tolerated treatment well    Behavior During Therapy WFL for tasks assessed/performed               Past Medical History:  Diagnosis Date   Arthritis    Carpal tunnel syndrome    GERD (gastroesophageal reflux disease)    Hard of hearing    left side   Hypothyroid    Pneumonia    Varicose vein of leg    Past Surgical History:  Procedure Laterality Date   ABDOMINAL HYSTERECTOMY     ABDOMINAL HYSTERECTOMY     APPENDECTOMY     BACK SURGERY     CARPAL TUNNEL RELEASE Left 06/26/2018   Procedure: CARPAL TUNNEL RELEASE;  Surgeon: Daryll Brod, MD;  Location: [place];  Service: Orthopedics;  Laterality: Left;   CARPAL TUNNEL RELEASE Right 04/04/2019   Procedure: RIGHT CARPAL TUNNEL RELEASE;  Surgeon: Daryll Brod, MD;  Location: [place];  Service: Orthopedics;  Laterality: Right;   cholesteatoma Left    COLON SURGERY     part of intestine removed   FOOT SURGERY Bilateral    IMPLANTATION BONE ANCHORED HEARING AID Left 01/2017   INJECTION KNEE Right 07/03/2017   Procedure: KNEE INJECTION;  Surgeon: Jessy Oto, MD;  Location: [place];  Service: Orthopedics;  Laterality: Right;   KNEE ARTHROSCOPY Right    SPINAL FUSION     L4 and L5 for spinal stenosis , 06/2017   TONSILLECTOMY     TOTAL KNEE ARTHROPLASTY Right 11/27/2018   TOTAL KNEE ARTHROPLASTY Right 11/27/2018   Procedure: RIGHT TOTAL KNEE ARTHROPLASTY;  Surgeon: Meredith Pel, MD;  Location: [place];  Service: Orthopedics;  Laterality:  Right;   Patient Active Problem List   Diagnosis Date Noted   Arthritis of right knee 11/27/2018   Unilateral primary osteoarthritis, right knee 07/05/2017    Class: Chronic   Anemia due to blood loss 07/05/2017    Class: Acute   Spinal stenosis, lumbar region with neurogenic claudication    Spondylolisthesis, lumbar region    S/P lumbar spinal fusion 07/03/2017   Mixed conductive and sensorineural hearing loss of left ear with restricted hearing of right ear 12/12/2016   Acute laryngitis 03/10/2016   Laryngopharyngeal reflux (LPR) 03/10/2016   Tympanic membrane perforation, right 03/10/2016   REFERRING Provider: Jessy Oto, MD  REFERRING DIAG: [phone] (ICD-10-CM) - Other spondylosis with radiculopathy, lumbar region R29.898 (ICD-10-CM) - Weakness of left hip   THERAPY DIAG:  Chronic left-sided low back pain without sciatica  Muscle weakness (generalized)  Difficulty in walking, not elsewhere classified  PERTINENT HISTORY: Arthritis, carpal tunnel syndrome, GERD, HOH, varicose vein, TKA right knee 11/27/2018   PRECAUTIONS: none  SUBJECTIVE: Pt. Reports 1/10 pain today and that the pain has not been too bad lately. She states her husband has noticed that her walking is improving without as much hip drop noted. PAIN:  Are you having pain? No NPRS scale: 1/10  Pain location:  Pain orientation:   PAIN TYPE:  Pain description:   Aggravating factors:  Relieving factors:        OBJECTIVE:    DIAGNOSTIC FINDINGS:    XR Lumbar Spine 2-3 Views   Result Date: 02/22/2021 AP and lateral flexion and extension radiographs of the lumbar spine, show TLIF L4-5 with residual anterolisthesis grade 1, the L4 neuroforamen are well decompressed. There is severe DDD L5-S1 with grade 1 retrolisthesis L3-4 and L5-S1. Disc narrowing is present all lumbar levels. Vacuum sign L5-S1 with endplate sclerosis and anterior osteophytes   PATIENT SURVEYS:  03/01/2021 FOTO 45% (predicted: 53%)         MUSCLE LENGTH: 03/01/21: Hamstrings: Right 70 deg; Left 62 deg   LUMBARAROM/PROM   A/PROM AROM  03/01/2021  Flexion 54  Extension 15  Right lateral flexion 30  Left lateral flexion 22  Right rotation 50% limited  Left rotation 75% limited   (Blank rows = not tested)   LE AROM/PROM:   AROM Right 03/01/2021 Left 03/01/2021  Hip flexion 112 104 with pain noted  Hip extension 15 10  Hip abduction 35 20  Hip adduction      Hip internal rotation      Hip external rotation      Knee flexion      Knee extension      Ankle dorsiflexion      Ankle plantarflexion      Ankle inversion      Ankle eversion       (Blank rows = not tested)   LE MMT:   MMT Right 03/01/2021 Left 03/01/2021  Hip flexion 4/5 4-/5  Hip extension 4/5 4-/5  Hip abduction 4/5 4-/5  Hip adduction 4/5 4/5  Hip internal rotation      Hip external rotation      Knee flexion 5/5 5/5  Knee extension 5/5 5/5  Ankle dorsiflexion      Ankle plantarflexion      Ankle inversion      Ankle eversion       (Blank rows = not tested)  FUNCTIONAL TESTS:  03/01/21: 5 times sit to stand: 18 seconds no UE support    Today's Treatment:  03/15/2021   Therapeutic Exercise:  Aerobic: Nustep L6 x 8 minutes  Supine: Bridges with clams green band 2X10, Hold; SLR hip flexion 2X15 bilat Sidelying: SLR hip abduction 2X15 bilat  Seated: Leg press 75# 2X10 DL, then 37# 2X10 SL on both. Green ball rollouts into lumbar flexion x 10 each middle, right, and left;   Standing: Step ups 6 inch X10 bilat with one UE support, lateral step ups 6inch X10 bilat 2 with UE support.  Hip hiking x 10 each LE; Lateral walking with green band around knees at counter top 4 round trips, monster walking with green band around knees at countertop 3 round trips. Neuromuscular Re-education:   Manual Therapy: Therapeutic Activity: Self Care: Trigger Point Dry Needling:  Modalities:    03/10/2021   Therapeutic Exercise:  Aerobic: Nustep L5 x  8 minutes  Supine: Bridges with ball squeeze 2 x 10, 3 sec hold, bilat LE on pball rolling into hip flexion x 15 10 sec. Hold; hamstring stretch with strap 2 x 30 sec. bilat Prone:  Seated: Green ball rollouts into lumbar flexion x 10 each middle, right, and left;   Standing:  2 UE support at countertop hip abduction and extension x 15 bilat 2.5#, Hamstring curls 2.5# 2 x 10  bilat; Hip hiking x 10 each LE; 1 UE support marching at counter top 2.5# 2 x 10 bilat;  Neuromuscular Re-education: Airex with lateral wt shifting 3 x 30 sec.  Manual Therapy: Therapeutic Activity: Self Care: Trigger Point Dry Needling:  Modalities:       PATIENT EDUCATION:  Education details: HEP Person educated: Patient Education method: Explanation, Demonstration, Corporate treasurer cues, Verbal cues, and Handouts Education comprehension: verbalized understanding, returned demonstration, and needs further education     HOME EXERCISE PROGRAM:   Access Code: [email or web address] URL: [email or web address] Date: 03/15/2021 Prepared by: [name]  Exercises Seated Lumbar Flexion Stretch - 2 x daily - 6 x weekly - 10 reps - 1 sets - 10 hold Hooklying Hamstring Stretch with Strap - 2 x daily - 3 reps - 30 seconds hold Supine Bridge with Resistance Band - 2 x daily - 6 x weekly - 10 reps - 2-3 sets Supine Active Straight Leg Raise - 2 x daily - 6 x weekly - 2 sets - 15 reps Sidelying Hip Abduction - 2 x daily - 6 x weekly - 2 sets - 15 reps Runner's Step Up/Down - 1 x daily - 7 x weekly - 2 sets - 10 reps Side Stepping with Resistance at Thighs - 2 x daily - 6 x weekly - 10 reps - 2 sets Forward Monster Walk with Resistance at [employer] and Counter Support - 2 x daily - 6 x weekly - 2 sets - 10 reps   ASSESSMENT:   CLINICAL IMPRESSION: She appears to be gaining strength with hip and lumbar muscles and gait is slowly improving. She does still have functional weakness noted and trendelenburg gait and we will continue  to work to improve this as able. We did progress her HEP today for more progressions at her request.    REHAB POTENTIAL: Good   CLINICAL DECISION MAKING: Stable/uncomplicated   EVALUATION COMPLEXITY: Low     GOALS: Goals reviewed with patient? Yes   SHORT TERM GOALS:   STG Name Target Date Goal status  1 Independent with initial HEP Baseline:  03/22/2021 Ongoing 03/08/2021  2 Pt will improve her 5 time sit to stand </= 15 seconds with UE support.  Baseline:  03/22/2021 Ongoing 03/08/2021             LONG TERM GOALS:    LTG Name Target Date Goal status  1 Independent with final HEP Baseline: 04/15/2021 On-going  2 FOTO score improved to 53% for improved function Baseline: 45% 04/15/2021 On-going  3 Pt will  improve her left hip strength to >/= 5/5 for improved function and gait.  Baseline: 04/15/2021 On-going  4 Report pain <  2/10 with amb >/= 1000 feet on community surfaces  Baseline: 04/15/2021 On-going  5   Baseline:      6   Baseline:      7   Baseline:            PLAN: PT FREQUENCY: 2x/week   PT DURATION: 6 weeks   PLANNED INTERVENTIONS: Therapeutic exercises, Therapeutic activity, Neuro Muscular re-education, Balance training, Gait training, Patient/Family education, Joint mobilization, Stair training, Dry Needling, Electrical stimulation, Spinal mobilization, Cryotherapy, Moist heat, Traction, Ultrasound, and Manual therapy   PLAN FOR NEXT SESSION: How is new HEP? Continue to progress hip strengthening and lumbar ROM exercises as tolerated.  Debbe Odea, PT 03/15/2021, 3:18 PM

## 2021-03-17 ENCOUNTER — Encounter: Payer: Medicare Other | Admitting: Physical Therapy

## 2021-03-25 ENCOUNTER — Encounter: Payer: Self-pay | Admitting: Physical Therapy

## 2021-03-25 ENCOUNTER — Ambulatory Visit (INDEPENDENT_AMBULATORY_CARE_PROVIDER_SITE_OTHER): Payer: Medicare Other | Admitting: Physical Therapy

## 2021-03-25 ENCOUNTER — Other Ambulatory Visit: Payer: Self-pay

## 2021-03-25 DIAGNOSIS — G8929 Other chronic pain: Secondary | ICD-10-CM | POA: Diagnosis not present

## 2021-03-25 DIAGNOSIS — R262 Difficulty in walking, not elsewhere classified: Secondary | ICD-10-CM | POA: Diagnosis not present

## 2021-03-25 DIAGNOSIS — M6281 Muscle weakness (generalized): Secondary | ICD-10-CM

## 2021-03-25 DIAGNOSIS — M545 Low back pain, unspecified: Secondary | ICD-10-CM

## 2021-03-25 NOTE — Therapy (Signed)
OUTPATIENT PHYSICAL THERAPY TREATMENT NOTE   Patient Name: [name] MRN: [id number] DOB:1950-02-11, 71 y.o., female Today's Date: 03/25/2021  PCP: Leeroy Cha, MD REFERRING PROVIDER: Jessy Oto, MD   PT End of Session - 03/25/21 0859     Visit Number 6    Number of Visits 13    Date for PT Re-Evaluation 04/15/21    PT Start Time 0845    PT Stop Time 0925    PT Time Calculation (min) 40 min    Activity Tolerance Patient tolerated treatment well    Behavior During Therapy WFL for tasks assessed/performed               Past Medical History:  Diagnosis Date   Arthritis    Carpal tunnel syndrome    GERD (gastroesophageal reflux disease)    Hard of hearing    left side   Hypothyroid    Pneumonia    Varicose vein of leg    Past Surgical History:  Procedure Laterality Date   ABDOMINAL HYSTERECTOMY     ABDOMINAL HYSTERECTOMY     APPENDECTOMY     BACK SURGERY     CARPAL TUNNEL RELEASE Left 06/26/2018   Procedure: CARPAL TUNNEL RELEASE;  Surgeon: Daryll Brod, MD;  Location: [place];  Service: Orthopedics;  Laterality: Left;   CARPAL TUNNEL RELEASE Right 04/04/2019   Procedure: RIGHT CARPAL TUNNEL RELEASE;  Surgeon: Daryll Brod, MD;  Location: [place];  Service: Orthopedics;  Laterality: Right;   cholesteatoma Left    COLON SURGERY     part of intestine removed   FOOT SURGERY Bilateral    IMPLANTATION BONE ANCHORED HEARING AID Left 01/2017   INJECTION KNEE Right 07/03/2017   Procedure: KNEE INJECTION;  Surgeon: Jessy Oto, MD;  Location: [place] [facility];  Service: Orthopedics;  Laterality: Right;   KNEE ARTHROSCOPY Right    SPINAL FUSION     L4 and L5 for spinal stenosis , 06/2017   TONSILLECTOMY     TOTAL KNEE ARTHROPLASTY Right 11/27/2018   TOTAL KNEE ARTHROPLASTY Right 11/27/2018   Procedure: RIGHT TOTAL KNEE ARTHROPLASTY;  Surgeon: Meredith Pel, MD;  Location: [place];  Service: Orthopedics;  Laterality:  Right;   Patient Active Problem List   Diagnosis Date Noted   Arthritis of right knee 11/27/2018   Unilateral primary osteoarthritis, right knee 07/05/2017    Class: Chronic   Anemia due to blood loss 07/05/2017    Class: Acute   Spinal stenosis, lumbar region with neurogenic claudication    Spondylolisthesis, lumbar region    S/P lumbar spinal fusion 07/03/2017   Mixed conductive and sensorineural hearing loss of left ear with restricted hearing of right ear 12/12/2016   Acute laryngitis 03/10/2016   Laryngopharyngeal reflux (LPR) 03/10/2016   Tympanic membrane perforation, right 03/10/2016   REFERRING Provider: Jessy Oto, MD  REFERRING DIAG: [phone] (ICD-10-CM) - Other spondylosis with radiculopathy, lumbar region R29.898 (ICD-10-CM) - Weakness of left hip   THERAPY DIAG:  Chronic left-sided low back pain without sciatica  Muscle weakness (generalized)  Difficulty in walking, not elsewhere classified  PERTINENT HISTORY: Arthritis, carpal tunnel syndrome, GERD, HOH, varicose vein, TKA right knee 11/27/2018   PRECAUTIONS: none  SUBJECTIVE: Pt. Reports 1/10 pain today and that the pain has not been bad. She says she can tell she is walking with less hip drop PAIN:  Are you having pain? No NPRS scale: 1/10 Pain location:  Pain orientation:  PAIN TYPE:  Pain description:   Aggravating factors:  Relieving factors:        OBJECTIVE:    DIAGNOSTIC FINDINGS:    XR Lumbar Spine 2-3 Views   Result Date: 02/22/2021 AP and lateral flexion and extension radiographs of the lumbar spine, show TLIF L4-5 with residual anterolisthesis grade 1, the L4 neuroforamen are well decompressed. There is severe DDD L5-S1 with grade 1 retrolisthesis L3-4 and L5-S1. Disc narrowing is present all lumbar levels. Vacuum sign L5-S1 with endplate sclerosis and anterior osteophytes   PATIENT SURVEYS:  03/01/2021 FOTO 45% (predicted: 53%)        MUSCLE LENGTH: 03/01/21: Hamstrings: Right  70 deg; Left 62 deg   LUMBARAROM/PROM   A/PROM AROM  03/01/2021  Flexion 54  Extension 15  Right lateral flexion 30  Left lateral flexion 22  Right rotation 50% limited  Left rotation 75% limited   (Blank rows = not tested)   LE AROM/PROM:   AROM Right 03/01/2021 Left 03/01/2021  Hip flexion 112 104 with pain noted  Hip extension 15 10  Hip abduction 35 20  Hip adduction      Hip internal rotation      Hip external rotation      Knee flexion      Knee extension      Ankle dorsiflexion      Ankle plantarflexion      Ankle inversion      Ankle eversion       (Blank rows = not tested)   LE MMT:   MMT Right 03/01/2021 Left 03/01/2021  Hip flexion 4/5 4-/5  Hip extension 4/5 4-/5  Hip abduction 4/5 4-/5  Hip adduction 4/5 4/5  Hip internal rotation      Hip external rotation      Knee flexion 5/5 5/5  Knee extension 5/5 5/5  Ankle dorsiflexion      Ankle plantarflexion      Ankle inversion      Ankle eversion       (Blank rows = not tested)  FUNCTIONAL TESTS:  03/01/21: 5 times sit to stand: 18 seconds no UE support    Today's Treatment:  03/25/2021   Therapeutic Exercise:  Aerobic: Nustep L6 x 8 minutes  Supine: Bridges with clams green band 2X10,  SLR hip flexion 2X15 bilat Sidelying: SLR hip abduction 2X15 bilat  Seated: Leg press 81# 3X10 DL, then 37# 2X10 SL on both. Green ball rollouts into lumbar flexion x 10 each middle, right, and left;   Standing: Step ups 6 inch X15 bilat with one UE support, lateral step ups 6inch X10 bilat 2 with UE support.  Hip hiking x 15 each LE; Lateral walking with green band around knees at counter top 4 round trips, monster walking with green band around knees at countertop 4 round trips. Lateral stepping to 3 cones (fwd, side, behind) X 5 ea with green band. Neuromuscular Re-education:   Manual Therapy: Therapeutic Activity: Self Care: Trigger Point Dry Needling:  Modalities:    03/15/2021   Therapeutic  Exercise:  Aerobic: Nustep L6 x 8 minutes  Supine: Bridges with clams green band 2X10, Hold; SLR hip flexion 2X15 bilat Sidelying: SLR hip abduction 2X15 bilat  Seated: Leg press 75# 2X10 DL, then 37# 2X10 SL on both. Green ball rollouts into lumbar flexion x 10 each middle, right, and left;   Standing: Step ups 6 inch X10 bilat with one UE support, lateral step ups 6inch X10 bilat 2  with UE support.  Hip hiking x 10 each LE; Lateral walking with green band around knees at counter top 4 round trips, monster walking with green band around knees at countertop 3 round trips. Neuromuscular Re-education:   Manual Therapy: Therapeutic Activity: Self Care: Trigger Point Dry Needling:  Modalities:      PATIENT EDUCATION:  Education details: HEP Person educated: Patient Education method: Explanation, Demonstration, Corporate treasurer cues, Verbal cues, and Handouts Education comprehension: verbalized understanding, returned demonstration, and needs further education     HOME EXERCISE PROGRAM:   Access Code: [email or web address] URL: [email or web address] Date: 03/15/2021 Prepared by: [name]  Exercises Seated Lumbar Flexion Stretch - 2 x daily - 6 x weekly - 10 reps - 1 sets - 10 hold Hooklying Hamstring Stretch with Strap - 2 x daily - 3 reps - 30 seconds hold Supine Bridge with Resistance Band - 2 x daily - 6 x weekly - 10 reps - 2-3 sets Supine Active Straight Leg Raise - 2 x daily - 6 x weekly - 2 sets - 15 reps Sidelying Hip Abduction - 2 x daily - 6 x weekly - 2 sets - 15 reps Runner's Step Up/Down - 1 x daily - 7 x weekly - 2 sets - 10 reps Side Stepping with Resistance at Thighs - 2 x daily - 6 x weekly - 10 reps - 2 sets Forward Monster Walk with Resistance at [employer] and Counter Support - 2 x daily - 6 x weekly - 2 sets - 10 reps   ASSESSMENT:   CLINICAL IMPRESSION: Her gait is improving overall with less trendelenburg due to improved hip strength and stability. Her pain levels  have been overall better as well and she is pleased with the progress she is making with PT.    REHAB POTENTIAL: Good   CLINICAL DECISION MAKING: Stable/uncomplicated   EVALUATION COMPLEXITY: Low     GOALS: Goals reviewed with patient? Yes   SHORT TERM GOALS:   STG Name Target Date Goal status  1 Independent with initial HEP Baseline:  03/22/2021 Ongoing 03/08/2021  2 Pt will improve her 5 time sit to stand </= 15 seconds with UE support.  Baseline:  03/22/2021 Ongoing 03/08/2021             LONG TERM GOALS:    LTG Name Target Date Goal status  1 Independent with final HEP Baseline: 04/15/2021 On-going  2 FOTO score improved to 53% for improved function Baseline: 45% 04/15/2021 On-going  3 Pt will  improve her left hip strength to >/= 5/5 for improved function and gait.  Baseline: 04/15/2021 On-going  4 Report pain <  2/10 with amb >/= 1000 feet on community surfaces  Baseline: 04/15/2021 On-going  5   Baseline:      6   Baseline:      7   Baseline:            PLAN: PT FREQUENCY: 2x/week   PT DURATION: 6 weeks   PLANNED INTERVENTIONS: Therapeutic exercises, Therapeutic activity, Neuro Muscular re-education, Balance training, Gait training, Patient/Family education, Joint mobilization, Stair training, Dry Needling, Electrical stimulation, Spinal mobilization, Cryotherapy, Moist heat, Traction, Ultrasound, and Manual therapy   PLAN FOR NEXT SESSION: update measurements for recert as she wants to continue. Continue to progress hip strengthening and lumbar ROM exercises as tolerated.  Debbe Odea, PT,DPT 03/25/2021, 9:09 AM

## 2021-03-30 ENCOUNTER — Encounter: Payer: Self-pay | Admitting: Physical Therapy

## 2021-03-30 ENCOUNTER — Other Ambulatory Visit: Payer: Self-pay

## 2021-03-30 ENCOUNTER — Ambulatory Visit (INDEPENDENT_AMBULATORY_CARE_PROVIDER_SITE_OTHER): Payer: Medicare Other | Admitting: Physical Therapy

## 2021-03-30 DIAGNOSIS — M545 Low back pain, unspecified: Secondary | ICD-10-CM

## 2021-03-30 DIAGNOSIS — G8929 Other chronic pain: Secondary | ICD-10-CM | POA: Diagnosis not present

## 2021-03-30 DIAGNOSIS — M6281 Muscle weakness (generalized): Secondary | ICD-10-CM | POA: Diagnosis not present

## 2021-03-30 DIAGNOSIS — R262 Difficulty in walking, not elsewhere classified: Secondary | ICD-10-CM

## 2021-03-30 NOTE — Therapy (Signed)
?OUTPATIENT PHYSICAL THERAPY TREATMENT NOTE ? ? ?Patient Name: [name] ?MRN: [id number] ?DOB:[birth year], 71 y.o., female ?Today's Date: 03/30/2021 ? ?PCP: Leeroy Cha, MD ?REFERRING PROVIDER: Jessy Oto, MD ? ? PT End of Session - 03/30/21 1428   ? ? Visit Number 7   ? Number of Visits 13   ? Date for PT Re-Evaluation 04/15/21   ? PT Start Time 1425   ? PT Stop Time 1505   ? PT Time Calculation (min) 40 min   ? Activity Tolerance Patient tolerated treatment well   ? Behavior During Therapy [facility] for tasks assessed/performed   ? ?  ?  ? ?  ? ? ? ? ?Past Medical History:  ?Diagnosis Date  ? Arthritis   ? Carpal tunnel syndrome   ? GERD (gastroesophageal reflux disease)   ? Hard of hearing   ? left side  ? Hypothyroid   ? Pneumonia   ? Varicose vein of leg   ? ?Past Surgical History:  ?Procedure Laterality Date  ? ABDOMINAL HYSTERECTOMY    ? ABDOMINAL HYSTERECTOMY    ? APPENDECTOMY    ? BACK SURGERY    ? CARPAL TUNNEL RELEASE Left 06/26/2018  ? Procedure: CARPAL TUNNEL RELEASE;  Surgeon: Daryll Brod, MD;  Location: [place];  Service: Orthopedics;  Laterality: Left;  ? CARPAL TUNNEL RELEASE Right 04/04/2019  ? Procedure: RIGHT CARPAL TUNNEL RELEASE;  Surgeon: Daryll Brod, MD;  Location: [place];  Service: Orthopedics;  Laterality: Right;  ? cholesteatoma Left   ? COLON SURGERY    ? part of intestine removed  ? FOOT SURGERY Bilateral   ? IMPLANTATION BONE ANCHORED HEARING AID Left 01/2017  ? INJECTION KNEE Right 07/03/2017  ? Procedure: KNEE INJECTION;  Surgeon: Jessy Oto, MD;  Location: [place];  Service: Orthopedics;  Laterality: Right;  ? KNEE ARTHROSCOPY Right   ? SPINAL FUSION    ? L4 and L5 for spinal stenosis , 06/2017  ? TONSILLECTOMY    ? TOTAL KNEE ARTHROPLASTY Right 11/27/2018  ? TOTAL KNEE ARTHROPLASTY Right 11/27/2018  ? Procedure: RIGHT TOTAL KNEE ARTHROPLASTY;  Surgeon: Meredith Pel, MD;  Location: [place];  Service: Orthopedics;  Laterality:  Right;  ? ?Patient Active Problem List  ? Diagnosis Date Noted  ? Arthritis of right knee 11/27/2018  ? Unilateral primary osteoarthritis, right knee 07/05/2017  ?  Class: Chronic  ? Anemia due to blood loss 07/05/2017  ?  Class: Acute  ? Spinal stenosis, lumbar region with neurogenic claudication   ? Spondylolisthesis, lumbar region   ? S/P lumbar spinal fusion 07/03/2017  ? Mixed conductive and sensorineural hearing loss of left ear with restricted hearing of right ear 12/12/2016  ? Acute laryngitis 03/10/2016  ? Laryngopharyngeal reflux (LPR) 03/10/2016  ? Tympanic membrane perforation, right 03/10/2016  ? ?REFERRING Provider: Jessy Oto, MD ? ?REFERRING DIAG: M47.26 (ICD-10-CM) - Other spondylosis with radiculopathy, lumbar region R29.898 (ICD-10-CM) - Weakness of left hip  ? ?THERAPY DIAG:  ?Chronic left-sided low back pain without sciatica ? ?Muscle weakness (generalized) ? ?Difficulty in walking, not elsewhere classified ? ?PERTINENT HISTORY: Arthritis, carpal tunnel syndrome, GERD, HOH, varicose vein, TKA right knee 11/27/2018 ? ? ?PRECAUTIONS: none ? ?SUBJECTIVE: Pt. Reports she was having a lot more knee pain for 4 days after last time, may have over did it with leg press or band work. She says she feels much better today. ? ?PAIN:  ?Are you having pain? Yes ?  NPRS scale: 1-2/10 ?Pain location:  ?Pain orientation:   ?PAIN TYPE:  ?Pain description:   ?Aggravating factors:  ?Relieving factors:  ? ? ? ? ?  ?OBJECTIVE:  ?  ?DIAGNOSTIC FINDINGS:  ?  ?XR Lumbar Spine 2-3 Views ?  ?Result Date: 02/22/2021 ?AP and lateral flexion and extension radiographs of the lumbar spine, show TLIF L4-5 with residual anterolisthesis grade 1, the L4 neuroforamen are well decompressed. There is severe DDD L5-S1 with grade 1 retrolisthesis L3-4 and L5-S1. Disc narrowing is present all lumbar levels. Vacuum sign L5-S1 with endplate sclerosis and anterior osteophytes ?  ?PATIENT SURVEYS:  ?03/01/2021 ?FOTO 45% (predicted: 53%) ?  ?    ?  ?MUSCLE LENGTH: ?03/01/21: Hamstrings: Right 70 deg; Left 62 deg ?03/30/21  Hamstrings: Right 70 deg; Left 65 deg ?  ?LUMBAR AROM/PROM ?  ?A/PROM AROM  ?03/01/2021 AROM ?03/30/21  ?Flexion 54 80  ?Extension 15 25  ?Right lateral flexion 30 35  ?Left lateral flexion 22 30  ?Right rotation 50% limited WFL  ?Left rotation 75% limited WFL  ? (Blank rows = not tested) ?  ?LE AROM/PROM: ?  ?AROM Right ?03/01/2021 Left ?03/01/2021  ?Hip flexion 112 104 with pain noted  ?Hip extension 15 10  ?Hip abduction 35 20  ?Hip adduction      ?Hip internal rotation      ?Hip external rotation      ?Knee flexion      ?Knee extension      ?Ankle dorsiflexion      ?Ankle plantarflexion      ?Ankle inversion      ?Ankle eversion      ? (Blank rows = not tested) ?  ?LE MMT: ?  ?MMT Right ?03/01/2021 Left ?03/01/2021 Right ?03/30/21 Left ?03/30/21  ?Hip flexion 4/5 4-/5    ?Hip extension 4/5 4-/5    ?Hip abduction 4/5 4-/5    ?Hip adduction 4/5 4/5    ?Hip internal rotation        ?Hip external rotation        ?Knee flexion 5/5 5/5    ?Knee extension 5/5 5/5    ?Ankle dorsiflexion        ?Ankle plantarflexion        ?Ankle inversion        ?Ankle eversion        ? (Blank rows = not tested) ? ?FUNCTIONAL TESTS:  ?03/01/21: 5 times sit to stand: 18 seconds no UE support ?   ?Today's Treatment:  ?03/30/2021  ?Therapeutic Exercise: ? Aerobic: Nustep L6 x 8 minutes  ?Supine: DKTC with feet on Pball 10 sec X10, Hamstring stretch 30 sec X 3 on Lt, X 2 on Rt.  SLR hip flexion   2X15 bilat ?Sidelying: SLR hip abduction 2X15 bilat ? Seated: Leg press 75# 3X10 DL. Green ball rollouts into lumbar flexion holding 5 sec x 5 each middle, right, and  left ? Standing:   Hip hiking 2x10 each LE; Lateral walking with green band around ankles at counter top 3 round   trips, monster walking with green band around ankles at countertop 3 round trips. Heel and toe raises X 15 ea   bilat. Slantboard stretch 30 sec X 3 bilat ?Neuromuscular Re-education:   ?Manual  Therapy: ?Therapeutic Activity: ?Self Care: ?Trigger Point Dry Needling:  ?Modalities:   ? ?03/25/2021  ? Therapeutic Exercise: ? Aerobic: Nustep L6 x 8 minutes  ?Supine: Bridges with clams green  2X10,  SLR hip flexion 2X15  bilat ?Sidelying: SLR hip abduction 2X15 bilat ? Seated: Leg press 81# 3X10 DL, then 37# 2X10 SL on both. Green ball rollouts into lumbar flexion x 10 each middle, right, and left;  ? Standing: Step ups 6 inch X15 bilat with one UE support, lateral step ups 6inch X10 bilat 2 with UE support.  Hip hiking x 15 each LE; Lateral walking with green band around knees at counter top 4 round trips, monster walking with green band around knees at countertop 4 round trips.  ?Neuromuscular Re-education:   ?Manual Therapy: ?Therapeutic Activity: ?Self Care: ?Trigger Point Dry Needling:  ?Modalities:   ? ?03/15/2021  ? Therapeutic Exercise: ? Aerobic: Nustep L6 x 8 minutes  ?Supine: Bridges with clams green band 2X10, Hold; SLR hip flexion 2X15 bilat ?Sidelying: SLR hip abduction 2X15 bilat ? Seated: Leg press 75# 2X10 DL, then 37# 2X10 SL on both. Green ball rollouts into lumbar flexion x 10 each middle, right, and left;  ? Standing: Step ups 6 inch X10 bilat with one UE support, lateral step ups 6inch X10 bilat 2 with UE support.  Hip hiking x 10 each LE; Lateral walking with green band around knees at counter top 4 round trips, monster walking with green band around knees at countertop 3 round trips. ?Neuromuscular Re-education:   ?Manual Therapy: ?Therapeutic Activity: ?Self Care: ?Trigger Point Dry Needling:  ?Modalities:   ? ?  ?PATIENT EDUCATION:  ?Education details: HEP ?Person educated: Patient ?Education method: Explanation, Demonstration, Tactile cues, Verbal cues, and Handouts ?Education comprehension: verbalized understanding, returned demonstration, and needs further education ?  ?  ?HOME EXERCISE PROGRAM: ?  ?Access Code: [email or web address] ?URL: [email or web address] ?Date:  03/15/2021 ?Prepared by: [name] ? ?Exercises ?Seated Lumbar Flexion Stretch - 2 x daily - 6 x weekly - 10 reps - 1 sets - 10 hold ?Hooklying Hamstring Stretch with Strap - 2 x daily - 3 reps - 30 seconds hold ?Supine Bri

## 2021-04-02 ENCOUNTER — Ambulatory Visit (INDEPENDENT_AMBULATORY_CARE_PROVIDER_SITE_OTHER): Payer: Medicare Other | Admitting: Specialist

## 2021-04-02 ENCOUNTER — Other Ambulatory Visit: Payer: Self-pay

## 2021-04-02 ENCOUNTER — Encounter: Payer: Self-pay | Admitting: Specialist

## 2021-04-02 VITALS — BP 138/87 | HR 64 | Ht 62.0 in | Wt 185.0 lb

## 2021-04-02 DIAGNOSIS — Z981 Arthrodesis status: Secondary | ICD-10-CM | POA: Diagnosis not present

## 2021-04-02 DIAGNOSIS — M4317 Spondylolisthesis, lumbosacral region: Secondary | ICD-10-CM

## 2021-04-02 DIAGNOSIS — M4726 Other spondylosis with radiculopathy, lumbar region: Secondary | ICD-10-CM | POA: Diagnosis not present

## 2021-04-02 DIAGNOSIS — M5440 Lumbago with sciatica, unspecified side: Secondary | ICD-10-CM | POA: Diagnosis not present

## 2021-04-02 NOTE — Patient Instructions (Signed)
Plan: Avoid bending, stooping and avoid lifting weights greater than 10 lbs. ?Avoid prolong standing and walking. ?Avoid frequent bending and stooping  ?No lifting greater than 10 lbs. ?May use ice or moist heat for pain. ?Weight loss is of benefit. ?Handicap license is approved. ?Physical therapy for left hip abduction and left fooot DF strengthening and Williams flexion exercises.  ?  ?

## 2021-04-02 NOTE — Progress Notes (Signed)
Office Visit Note   Patient: [name]           Date of Birth: [birth year]           MRN: [id number] Visit Date: 04/02/2021              Requested by: Leeroy Cha, MD [address] [place] [id number] PCP: Leeroy Cha, MD   Assessment & Plan: Visit Diagnoses:  1. Acute right-sided low back pain with sciatica, sciatica laterality unspecified   2. Spondylolisthesis, lumbosacral region   3. Other spondylosis with radiculopathy, lumbar region   4. S/P lumbar spinal fusion     Plan: Plan: Avoid bending, stooping and avoid lifting weights greater than 10 lbs. Avoid prolong standing and walking. Avoid frequent bending and stooping  No lifting greater than 10 lbs. May use ice or moist heat for pain. Weight loss is of benefit. Handicap license is approved. Physical therapy for left hip abduction and left fooot DF strengthening and Williams flexion exercises.     Follow-Up Instructions: Return in about 8 weeks (around 05/28/2021).   Orders:  No orders of the defined types were placed in this encounter.  No orders of the defined types were placed in this encounter.     Procedures: No procedures performed   Clinical Data: No additional findings.   Subjective: Chief Complaint  Patient presents with   Lower Back - Follow-up    71 year old female with lumbar radicular pain and weakness in hip strength and foot DF. Has been in PT and is seeing improved gait and lower extremity strength.    Review of Systems  Constitutional: Negative.   HENT:  Positive for congestion, rhinorrhea, sinus pressure, sinus pain and sneezing.   Eyes: Negative.   Respiratory: Negative.    Cardiovascular: Negative.   Gastrointestinal: Negative.   Endocrine: Negative.   Genitourinary: Negative.   Musculoskeletal: Negative.   Skin: Negative.   Allergic/Immunologic: Negative.   Neurological: Negative.   Hematological: Negative.    Psychiatric/Behavioral: Negative.      Objective: Vital Signs: BP 138/87 (BP Location: Left Arm, Patient Position: Sitting)   Pulse 64   Ht '5\' 2"'$  (1.575 m)   Wt 185 lb (83.9 kg)   BMI 33.84 kg/m   Physical Exam Constitutional:      Appearance: She is well-developed.  HENT:     Head: Normocephalic and atraumatic.  Eyes:     Pupils: Pupils are equal, round, and reactive to light.  Pulmonary:     Effort: Pulmonary effort is normal.     Breath sounds: Normal breath sounds.  Abdominal:     General: Bowel sounds are normal.     Palpations: Abdomen is soft.  Musculoskeletal:     Cervical back: Normal range of motion and neck supple.     Lumbar back: Negative right straight leg raise test and negative left straight leg raise test.  Skin:    General: Skin is warm and dry.  Neurological:     Mental Status: She is alert and oriented to person, place, and time.  Psychiatric:        Behavior: Behavior normal.        Thought Content: Thought content normal.        Judgment: Judgment normal.    Back Exam   Tenderness  The patient is experiencing tenderness in the lumbar.  Range of Motion  Extension:  abnormal  Flexion:  abnormal  Lateral bend  right:  abnormal  Lateral bend left:  abnormal  Rotation right:  abnormal  Rotation left:  abnormal   Muscle Strength  Right Quadriceps:  5/5  Left Quadriceps:  5/5  Right Hamstrings:  5/5  Left Hamstrings:  5/5   Tests  Straight leg raise right: negative Straight leg raise left: negative  Reflexes  Patellar:  1/4 Achilles:  1/4 Babinski's sign: abnormal   Other  Toe walk: normal Heel walk: normal Erythema: no back redness Scars: absent     Specialty Comments:  No specialty comments available.  Imaging: No results found.   PMFS History: Patient Active Problem List   Diagnosis Date Noted   Unilateral primary osteoarthritis, right knee 07/05/2017    Priority: High    Class: Chronic   Anemia due to blood  loss 07/05/2017    Priority: High    Class: Acute   Arthritis of right knee 11/27/2018   Spinal stenosis, lumbar region with neurogenic claudication    Spondylolisthesis, lumbar region    S/P lumbar spinal fusion 07/03/2017   Mixed conductive and sensorineural hearing loss of left ear with restricted hearing of right ear 12/12/2016   Acute laryngitis 03/10/2016   Laryngopharyngeal reflux (LPR) 03/10/2016   Tympanic membrane perforation, right 03/10/2016   Past Medical History:  Diagnosis Date   Arthritis    Carpal tunnel syndrome    GERD (gastroesophageal reflux disease)    Hard of hearing    left side   Hypothyroid    Pneumonia    Varicose vein of leg     History reviewed. No pertinent family history.  Past Surgical History:  Procedure Laterality Date   ABDOMINAL HYSTERECTOMY     ABDOMINAL HYSTERECTOMY     APPENDECTOMY     BACK SURGERY     CARPAL TUNNEL RELEASE Left 06/26/2018   Procedure: CARPAL TUNNEL RELEASE;  Surgeon: Daryll Brod, MD;  Location: [place];  Service: Orthopedics;  Laterality: Left;   CARPAL TUNNEL RELEASE Right 04/04/2019   Procedure: RIGHT CARPAL TUNNEL RELEASE;  Surgeon: Daryll Brod, MD;  Location: [place];  Service: Orthopedics;  Laterality: Right;   cholesteatoma Left    COLON SURGERY     part of intestine removed   FOOT SURGERY Bilateral    IMPLANTATION BONE ANCHORED HEARING AID Left 01/2017   INJECTION KNEE Right 07/03/2017   Procedure: KNEE INJECTION;  Surgeon: Jessy Oto, MD;  Location: [place];  Service: Orthopedics;  Laterality: Right;   KNEE ARTHROSCOPY Right    SPINAL FUSION     L4 and L5 for spinal stenosis , 06/2017   TONSILLECTOMY     TOTAL KNEE ARTHROPLASTY Right 11/27/2018   TOTAL KNEE ARTHROPLASTY Right 11/27/2018   Procedure: RIGHT TOTAL KNEE ARTHROPLASTY;  Surgeon: Meredith Pel, MD;  Location: [place];  Service: Orthopedics;  Laterality: Right;   Social History   Occupational History   Not  on file  Tobacco Use   Smoking status: Never   Smokeless tobacco: Never  Vaping Use   Vaping Use: Never used  Substance and Sexual Activity   Alcohol use: Yes    Alcohol/week: 1.0 standard drink    Types: 1 Glasses of wine per week    Comment:  drinks wine daily with dinner   Drug use: Never   Sexual activity: Yes    Birth control/protection: Surgical

## 2021-04-22 ENCOUNTER — Encounter: Payer: Self-pay | Admitting: Physical Therapy

## 2021-04-22 ENCOUNTER — Ambulatory Visit (INDEPENDENT_AMBULATORY_CARE_PROVIDER_SITE_OTHER): Payer: Medicare Other | Admitting: Physical Therapy

## 2021-04-22 DIAGNOSIS — M545 Low back pain, unspecified: Secondary | ICD-10-CM | POA: Diagnosis not present

## 2021-04-22 DIAGNOSIS — M6281 Muscle weakness (generalized): Secondary | ICD-10-CM | POA: Diagnosis not present

## 2021-04-22 DIAGNOSIS — G8929 Other chronic pain: Secondary | ICD-10-CM

## 2021-04-22 DIAGNOSIS — R262 Difficulty in walking, not elsewhere classified: Secondary | ICD-10-CM | POA: Diagnosis not present

## 2021-04-22 NOTE — Therapy (Addendum)
OUTPATIENT PHYSICAL THERAPY TREATMENT NOTE/Discharge addendum PHYSICAL THERAPY DISCHARGE SUMMARY  Visits from Start of Care: 8  Current functional level related to goals / functional outcomes: See below   Remaining deficits: See below   Education / Equipment: HEP  Plan: Patient agrees to discharge.  Patient goals were met. Patient is being discharged due to meeting the stated rehab goals.    [name], PT, DPT 07/21/21 10:03 AM        Patient Name: [name] MRN: [id number] DOB:Feb 02, 1950, 71 y.o., female Today's Date: 04/22/2021  PCP: Leeroy Cha, MD REFERRING PROVIDER: Jessy Oto, MD   PT End of Session - 04/22/21 1307     Visit Number 8    Number of Visits 16    Date for PT Re-Evaluation 06/03/21    PT Start Time 1300    PT Stop Time 1340    PT Time Calculation (min) 40 min    Activity Tolerance Patient tolerated treatment well    Behavior During Therapy WFL for tasks assessed/performed               Past Medical History:  Diagnosis Date   Arthritis    Carpal tunnel syndrome    GERD (gastroesophageal reflux disease)    Hard of hearing    left side   Hypothyroid    Pneumonia    Varicose vein of leg    Past Surgical History:  Procedure Laterality Date   ABDOMINAL HYSTERECTOMY     ABDOMINAL HYSTERECTOMY     APPENDECTOMY     BACK SURGERY     CARPAL TUNNEL RELEASE Left 06/26/2018   Procedure: CARPAL TUNNEL RELEASE;  Surgeon: Daryll Brod, MD;  Location: [place];  Service: Orthopedics;  Laterality: Left;   CARPAL TUNNEL RELEASE Right 04/04/2019   Procedure: RIGHT CARPAL TUNNEL RELEASE;  Surgeon: Daryll Brod, MD;  Location: [place];  Service: Orthopedics;  Laterality: Right;   cholesteatoma Left    COLON SURGERY     part of intestine removed   FOOT SURGERY Bilateral    IMPLANTATION BONE ANCHORED HEARING AID Left 01/2017   INJECTION KNEE Right 07/03/2017   Procedure: KNEE INJECTION;   Surgeon: Jessy Oto, MD;  Location: [place];  Service: Orthopedics;  Laterality: Right;   KNEE ARTHROSCOPY Right    SPINAL FUSION     L4 and L5 for spinal stenosis , 06/2017   TONSILLECTOMY     TOTAL KNEE ARTHROPLASTY Right 11/27/2018   TOTAL KNEE ARTHROPLASTY Right 11/27/2018   Procedure: RIGHT TOTAL KNEE ARTHROPLASTY;  Surgeon: Meredith Pel, MD;  Location: [place];  Service: Orthopedics;  Laterality: Right;   Patient Active Problem List   Diagnosis Date Noted   Arthritis of right knee 11/27/2018   Unilateral primary osteoarthritis, right knee 07/05/2017    Class: Chronic   Anemia due to blood loss 07/05/2017    Class: Acute   Spinal stenosis, lumbar region with neurogenic claudication    Spondylolisthesis, lumbar region    S/P lumbar spinal fusion 07/03/2017   Mixed conductive and sensorineural hearing loss of left ear with restricted hearing of right ear 12/12/2016   Acute laryngitis 03/10/2016   Laryngopharyngeal reflux (LPR) 03/10/2016   Tympanic membrane perforation, right 03/10/2016   REFERRING Provider: Jessy Oto, MD  REFERRING DIAG: [phone] (ICD-10-CM) - Other spondylosis with radiculopathy, lumbar region R29.898 (ICD-10-CM) - Weakness of left hip   THERAPY DIAG:  Chronic left-sided low back pain without sciatica  Muscle weakness (generalized)  Difficulty in walking, not elsewhere classified  PERTINENT HISTORY: Arthritis, carpal tunnel syndrome, GERD, HOH, varicose vein, TKA right knee 11/27/2018   PRECAUTIONS: none  SUBJECTIVE: She relays she is feeling good today no pain upon arrival  PAIN:  NPRS scale: 0 Pain location:  Pain orientation:   PAIN TYPE:  Pain description:   Aggravating factors:  Relieving factors:        OBJECTIVE:    DIAGNOSTIC FINDINGS:    XR Lumbar Spine 2-3 Views   Result Date: 02/22/2021 AP and lateral flexion and extension radiographs of the lumbar spine, show TLIF L4-5 with residual anterolisthesis grade 1, the L4  neuroforamen are well decompressed. There is severe DDD L5-S1 with grade 1 retrolisthesis L3-4 and L5-S1. Disc narrowing is present all lumbar levels. Vacuum sign L5-S1 with endplate sclerosis and anterior osteophytes   PATIENT SURVEYS:  03/01/2021 FOTO 45% (predicted: 53%) 04/22/2021 FOTO 75% (predicted: 53%) exceeded goal         MUSCLE LENGTH: 03/01/21: Hamstrings: Right 70 deg; Left 62 deg 03/30/21  Hamstrings: Right 70 deg; Left 65 deg   LUMBAR AROM/PROM   A/PROM AROM  03/01/2021 AROM 03/30/21 AROM  Flexion 54 80 WFL  Extension 15 25 WFL  Right lateral flexion 30 35   Left lateral flexion 22 30   Right rotation 50% limited [facility] WFL  Left rotation 75% limited WFL WFL   (Blank rows = not tested)   LE AROM/PROM:   AROM Right 03/01/2021 Left 03/01/2021  Hip flexion 112 104 with pain noted  Hip extension 15 10  Hip abduction 35 20   (Blank rows = not tested)   LE MMT:   MMT Right 03/01/2021 Left 03/01/2021 Right 04/22/21 Left 04/22/21  Hip flexion 4/5 4-/_0 Hip extension 4/5 4-/5    Hip abduction 4/5 4-/_1 Hip adduction 4/5 4/_2 Hip internal rotation        Hip external rotation        Knee flexion 5/5 5/_3 Knee extension 5/5 5/_4 (Blank rows = not tested)  FUNCTIONAL TESTS:  03/01/21: 5 times sit to stand: 18 seconds no UE support 04/22/21: 5 times sit to stand: 8.5 seconds no UE support    Today's Treatment:  04/22/21 Therapeutic Exercise: Aerobic: recumbent bike seat #4 X 8 min Supine: SLR hip flexion 2X15 bilat Sidelying: SLR hip abduction 2X15 bilat Seated: Leg press 75# 3X10 DL. Green ball rollouts into lumbar flexion holding 5 sec x 5 each middle, right, and  Left. Sit to stands no UE support 2X5 Standing:Fwd step ups and lateral step ups X 10 bilat on 6 inch step. Hip hiking 2x10 each LE; Hip marches, abd and extensions 5# X 15 bilat . SLS 20 sec X 3 bilat  03/30/2021 Therapeutic Exercise:  Aerobic: Nustep L6 x 8 minutes  Supine: DKTC with  feet on Pball 10 sec X10, Hamstring stretch 30 sec X 3 on Lt, X 2 on Rt.  SLR hip flexion   2X15 bilat Sidelying: SLR hip abduction 2X15 bilat  Seated: Leg press 75# 3X10 DL. Green ball rollouts into lumbar flexion holding 5 sec x 5 each middle, right, and  left  Standing:   Hip hiking 2x10 each LE; Lateral walking with green band around ankles at counter top 3 round   trips, monster walking with green band around ankles at countertop 3 round trips. Heel and toe  raises X 15 ea   bilat. Slantboard stretch 30 sec X 3 bila    PATIENT EDUCATION: 04/22/21 Education details: HEP review Person educated: Patient Education method: Explanation Education comprehension: verbalized understanding, returned demonstration  HOME EXERCISE PROGRAM:   Access Code: [email or web address] URL: [email or web address] Date: 03/15/2021 Prepared by: [name]  Exercises Seated Lumbar Flexion Stretch - 2 x daily - 6 x weekly - 10 reps - 1 sets - 10 hold Hooklying Hamstring Stretch with Strap - 2 x daily - 3 reps - 30 seconds hold Supine Bridge with Resistance Band - 2 x daily - 6 x weekly - 10 reps - 2-3 sets Supine Active Straight Leg Raise - 2 x daily - 6 x weekly - 2 sets - 15 reps Sidelying Hip Abduction - 2 x daily - 6 x weekly - 2 sets - 15 reps Runner's Step Up/Down - 1 x daily - 7 x weekly - 2 sets - 10 reps Side Stepping with Resistance at Thighs - 2 x daily - 6 x weekly - 10 reps - 2 sets Forward Monster Walk with Resistance at [employer] and Counter Support - 2 x daily - 6 x weekly - 2 sets - 10 reps SLS 20 sec 2-3 reps 1 time per day   ASSESSMENT:   CLINICAL IMPRESSION:She has made great overall progress and feel she is ready to discharge to her independent program and she agrees. We reviewed HEP and she shows good understanding. I will hold her PT case open 60 days in the event she needs to come back and if she does not return by then we will discharge.    REHAB POTENTIAL: Good   CLINICAL DECISION  MAKING: Stable/uncomplicated   EVALUATION COMPLEXITY: Low     GOALS: Goals reviewed with patient? Yes   SHORT TERM GOALS:   STG Name Target Date Goal status  1 Independent with initial HEP Baseline:  03/22/2021 Met  2 Pt will improve her 5 time sit to stand </= 15 seconds with UE support.  Baseline:  03/22/2021 MET 04/22/21             LONG TERM GOALS:    LTG Name Target Date Goal status  1 Independent with final HEP Baseline: 04/15/2021 MET  2 FOTO score improved to 53% for improved function Baseline: 45% 04/15/2021 MET  3 Pt will  improve her left hip strength to >/= 5/5 for improved function and gait.  Baseline: 04/15/2021 MET  4 Report pain <  2/10 with amb >/= 1000 feet on community surfaces  Baseline: 04/15/2021 MET  5   Baseline:      6   Baseline:      7   Baseline:            PLAN: PT FREQUENCY: 2x/week   PT DURATION: 6 weeks   PLANNED INTERVENTIONS: Therapeutic exercises, Therapeutic activity, Neuro Muscular re-education, Balance training, Gait training, Patient/Family education, Joint mobilization, Stair training, Dry Needling, Electrical stimulation, Spinal mobilization, Cryotherapy, Moist heat, Traction, Ultrasound, and Manual therapy   PLAN FOR NEXT SESSION:  Hold PT for 60 days to trial HEP  Debbe Odea, PT,DPT 04/22/2021, 1:13 PM

## 2021-04-27 ENCOUNTER — Encounter: Payer: Medicare Other | Admitting: Physical Therapy

## 2021-05-04 ENCOUNTER — Encounter: Payer: Medicare Other | Admitting: Physical Therapy

## 2021-05-06 ENCOUNTER — Encounter: Payer: Medicare Other | Admitting: Physical Therapy

## 2021-05-11 ENCOUNTER — Encounter: Payer: Medicare Other | Admitting: Physical Therapy

## 2021-05-13 ENCOUNTER — Encounter: Payer: Medicare Other | Admitting: Physical Therapy

## 2021-06-04 DIAGNOSIS — Z1231 Encounter for screening mammogram for malignant neoplasm of breast: Secondary | ICD-10-CM | POA: Diagnosis not present

## 2021-07-07 DIAGNOSIS — H401114 Primary open-angle glaucoma, right eye, indeterminate stage: Secondary | ICD-10-CM | POA: Diagnosis not present

## 2021-07-07 DIAGNOSIS — H2513 Age-related nuclear cataract, bilateral: Secondary | ICD-10-CM | POA: Diagnosis not present

## 2021-07-07 DIAGNOSIS — H524 Presbyopia: Secondary | ICD-10-CM | POA: Diagnosis not present

## 2021-07-07 DIAGNOSIS — H5203 Hypermetropia, bilateral: Secondary | ICD-10-CM | POA: Diagnosis not present

## 2021-07-07 DIAGNOSIS — H353131 Nonexudative age-related macular degeneration, bilateral, early dry stage: Secondary | ICD-10-CM | POA: Diagnosis not present

## 2021-07-07 DIAGNOSIS — H52223 Regular astigmatism, bilateral: Secondary | ICD-10-CM | POA: Diagnosis not present

## 2021-07-07 DIAGNOSIS — H401121 Primary open-angle glaucoma, left eye, mild stage: Secondary | ICD-10-CM | POA: Diagnosis not present

## 2021-10-23 DIAGNOSIS — Z23 Encounter for immunization: Secondary | ICD-10-CM | POA: Diagnosis not present

## 2021-12-06 DIAGNOSIS — H401131 Primary open-angle glaucoma, bilateral, mild stage: Secondary | ICD-10-CM | POA: Diagnosis not present

## 2022-05-10 DIAGNOSIS — H9012 Conductive hearing loss, unilateral, left ear, with unrestricted hearing on the contralateral side: Secondary | ICD-10-CM | POA: Diagnosis not present

## 2022-05-10 DIAGNOSIS — J302 Other seasonal allergic rhinitis: Secondary | ICD-10-CM | POA: Diagnosis not present

## 2022-05-10 DIAGNOSIS — Z23 Encounter for immunization: Secondary | ICD-10-CM | POA: Diagnosis not present

## 2022-05-10 DIAGNOSIS — Z803 Family history of malignant neoplasm of breast: Secondary | ICD-10-CM | POA: Diagnosis not present

## 2022-05-10 DIAGNOSIS — D509 Iron deficiency anemia, unspecified: Secondary | ICD-10-CM | POA: Diagnosis not present

## 2022-05-10 DIAGNOSIS — Z0000 Encounter for general adult medical examination without abnormal findings: Secondary | ICD-10-CM | POA: Diagnosis not present

## 2022-05-10 DIAGNOSIS — E785 Hyperlipidemia, unspecified: Secondary | ICD-10-CM | POA: Diagnosis not present

## 2022-05-10 DIAGNOSIS — E039 Hypothyroidism, unspecified: Secondary | ICD-10-CM | POA: Diagnosis not present

## 2022-05-10 DIAGNOSIS — M1710 Unilateral primary osteoarthritis, unspecified knee: Secondary | ICD-10-CM | POA: Diagnosis not present

## 2022-05-10 DIAGNOSIS — Z1331 Encounter for screening for depression: Secondary | ICD-10-CM | POA: Diagnosis not present

## 2022-06-06 DIAGNOSIS — Z1231 Encounter for screening mammogram for malignant neoplasm of breast: Secondary | ICD-10-CM | POA: Diagnosis not present

## 2022-06-20 DIAGNOSIS — R000 Tachycardia, unspecified: Secondary | ICD-10-CM | POA: Diagnosis not present

## 2022-06-20 DIAGNOSIS — E039 Hypothyroidism, unspecified: Secondary | ICD-10-CM | POA: Diagnosis not present

## 2022-06-20 DIAGNOSIS — E785 Hyperlipidemia, unspecified: Secondary | ICD-10-CM | POA: Diagnosis not present

## 2022-07-22 DIAGNOSIS — H353131 Nonexudative age-related macular degeneration, bilateral, early dry stage: Secondary | ICD-10-CM | POA: Diagnosis not present

## 2022-07-22 DIAGNOSIS — H2513 Age-related nuclear cataract, bilateral: Secondary | ICD-10-CM | POA: Diagnosis not present

## 2022-07-22 DIAGNOSIS — H52223 Regular astigmatism, bilateral: Secondary | ICD-10-CM | POA: Diagnosis not present

## 2022-07-22 DIAGNOSIS — H53143 Visual discomfort, bilateral: Secondary | ICD-10-CM | POA: Diagnosis not present

## 2022-07-22 DIAGNOSIS — H5203 Hypermetropia, bilateral: Secondary | ICD-10-CM | POA: Diagnosis not present

## 2022-07-22 DIAGNOSIS — H524 Presbyopia: Secondary | ICD-10-CM | POA: Diagnosis not present

## 2022-09-28 DIAGNOSIS — Z23 Encounter for immunization: Secondary | ICD-10-CM | POA: Diagnosis not present

## 2022-10-23 DIAGNOSIS — Z23 Encounter for immunization: Secondary | ICD-10-CM | POA: Diagnosis not present

## 2022-12-08 DIAGNOSIS — H401131 Primary open-angle glaucoma, bilateral, mild stage: Secondary | ICD-10-CM | POA: Diagnosis not present

## 2023-05-16 DIAGNOSIS — Z0000 Encounter for general adult medical examination without abnormal findings: Secondary | ICD-10-CM | POA: Diagnosis not present

## 2023-05-16 DIAGNOSIS — E039 Hypothyroidism, unspecified: Secondary | ICD-10-CM | POA: Diagnosis not present

## 2023-05-16 DIAGNOSIS — Z23 Encounter for immunization: Secondary | ICD-10-CM | POA: Diagnosis not present

## 2023-05-16 DIAGNOSIS — R7989 Other specified abnormal findings of blood chemistry: Secondary | ICD-10-CM | POA: Diagnosis not present

## 2023-05-16 DIAGNOSIS — R946 Abnormal results of thyroid function studies: Secondary | ICD-10-CM | POA: Diagnosis not present

## 2023-05-16 DIAGNOSIS — E785 Hyperlipidemia, unspecified: Secondary | ICD-10-CM | POA: Diagnosis not present

## 2023-05-16 DIAGNOSIS — Z1331 Encounter for screening for depression: Secondary | ICD-10-CM | POA: Diagnosis not present

## 2023-05-16 DIAGNOSIS — D509 Iron deficiency anemia, unspecified: Secondary | ICD-10-CM | POA: Diagnosis not present

## 2023-05-16 DIAGNOSIS — M1710 Unilateral primary osteoarthritis, unspecified knee: Secondary | ICD-10-CM | POA: Diagnosis not present

## 2023-05-16 DIAGNOSIS — E669 Obesity, unspecified: Secondary | ICD-10-CM | POA: Diagnosis not present

## 2023-05-17 DIAGNOSIS — J45909 Unspecified asthma, uncomplicated: Secondary | ICD-10-CM | POA: Diagnosis not present

## 2023-05-17 DIAGNOSIS — E039 Hypothyroidism, unspecified: Secondary | ICD-10-CM | POA: Diagnosis not present

## 2023-05-17 DIAGNOSIS — E785 Hyperlipidemia, unspecified: Secondary | ICD-10-CM | POA: Diagnosis not present

## 2023-05-17 DIAGNOSIS — H409 Unspecified glaucoma: Secondary | ICD-10-CM | POA: Diagnosis not present

## 2023-06-17 DIAGNOSIS — E785 Hyperlipidemia, unspecified: Secondary | ICD-10-CM | POA: Diagnosis not present

## 2023-06-17 DIAGNOSIS — E039 Hypothyroidism, unspecified: Secondary | ICD-10-CM | POA: Diagnosis not present

## 2023-06-17 DIAGNOSIS — H409 Unspecified glaucoma: Secondary | ICD-10-CM | POA: Diagnosis not present

## 2023-06-17 DIAGNOSIS — J45909 Unspecified asthma, uncomplicated: Secondary | ICD-10-CM | POA: Diagnosis not present

## 2023-06-18 IMAGING — MR MR LUMBAR SPINE W/O CM
4 of 5 series · 26 of 48 positions shown · non-contrast
Comparison: Lumbar spine radiographs 11/19/2020, lumbar spine MRI
11/15/2016

CLINICAL DATA: Left lower back pain, history of surgery in 8282

EXAM:
MRI LUMBAR SPINE WITHOUT CONTRAST
TECHNIQUE: Multiplanar, multisequence MR imaging of the lumbar spine was
performed. No intravenous contrast was administered.

[Series 3: T2 · sagittal · 4.0mm · 1.09mm/px · 6 of 17 slices shown (1 of 2)]
[im 1/17]
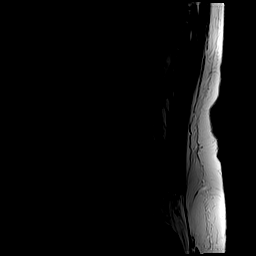
[im 4/17]
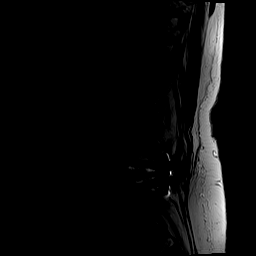
[im 7/17]
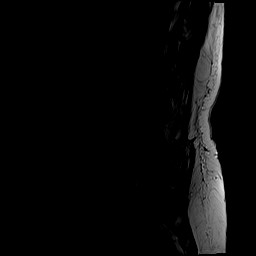
[im 10/17]
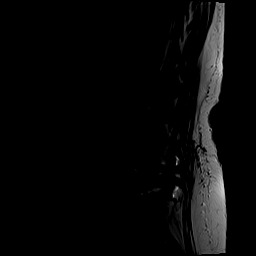
[im 13/17]
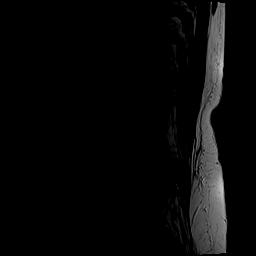
[im 17/17]
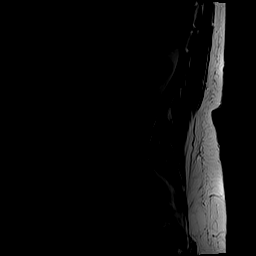

[Series 5: T1 · sagittal · 4.0mm · 1.09mm/px · 7 of 17 slices shown (1 of 2)]
[im 1/17]
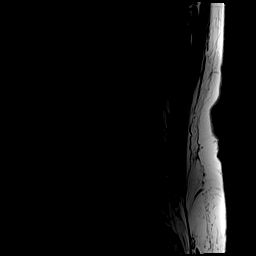
[im 3/17]
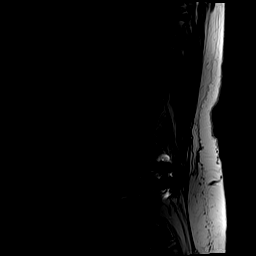
[im 6/17]
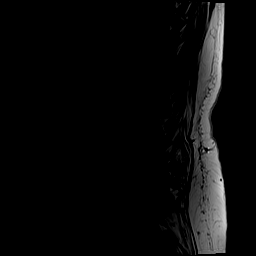
[im 9/17]
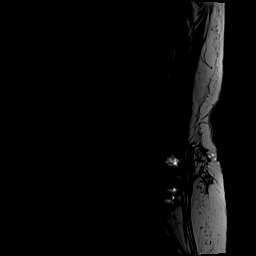
[im 11/17]
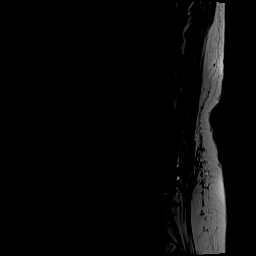
[im 14/17]
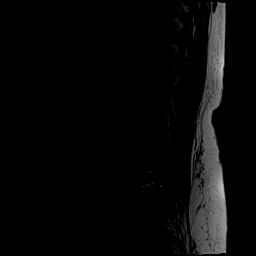
[im 17/17]
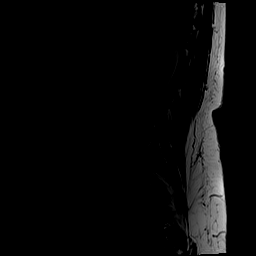

[Series 6: T2 · axial · 4.0mm · 0.39mm/px · z∈[-42,+154]mm · 8 of 37 slices shown (2 of 2)]
[im 1/37]
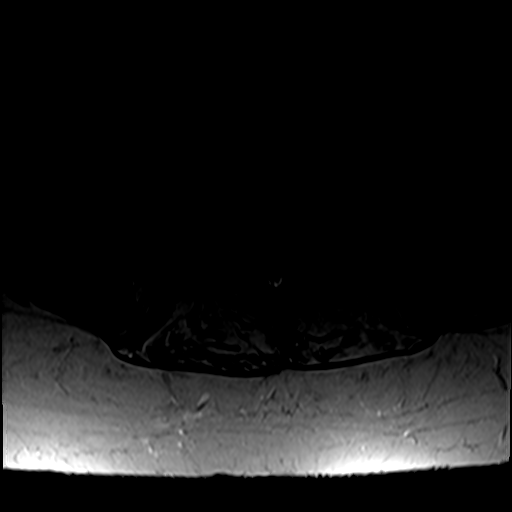
[im 6/37]
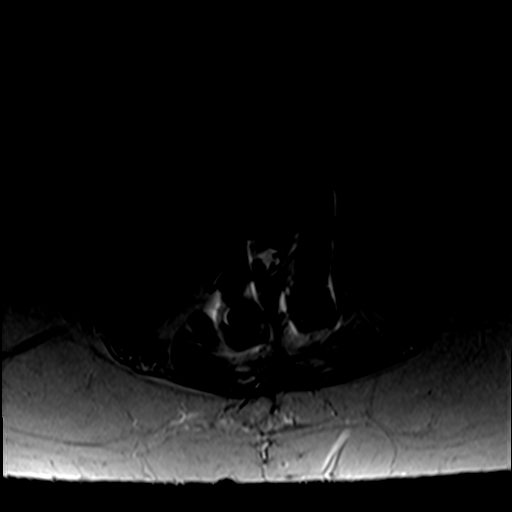
[im 12/37]
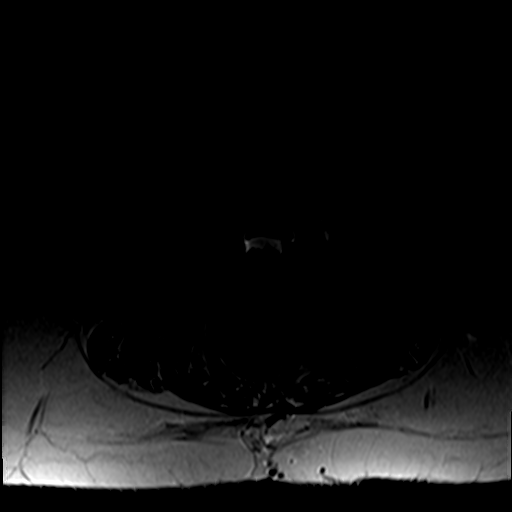
[im 17/37]
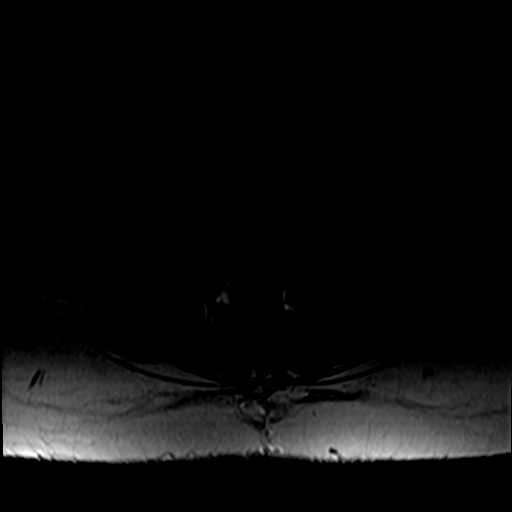
[im 20/37]
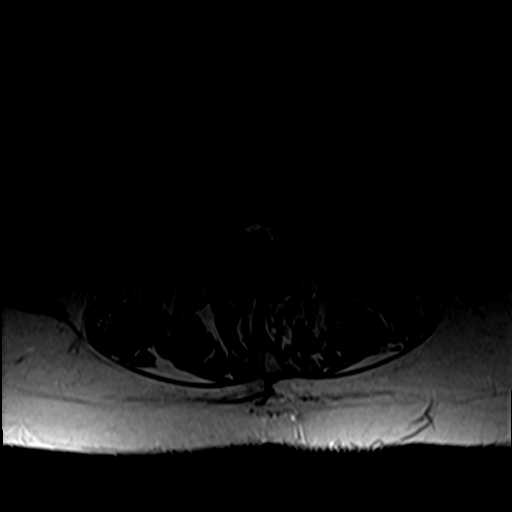
[im 25/37]
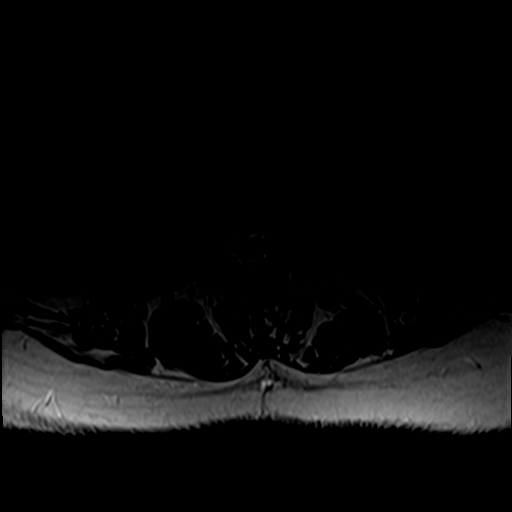
[im 31/37]
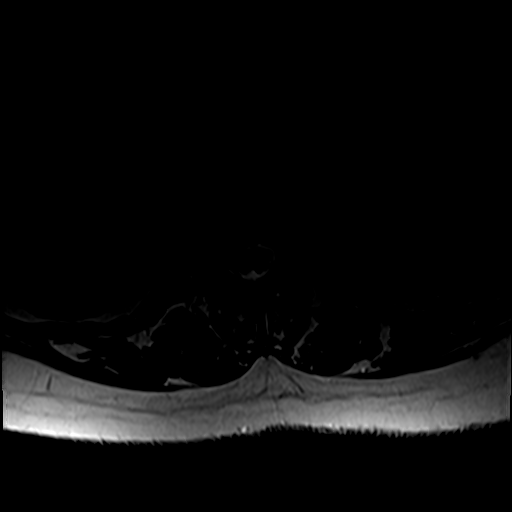
[im 37/37]
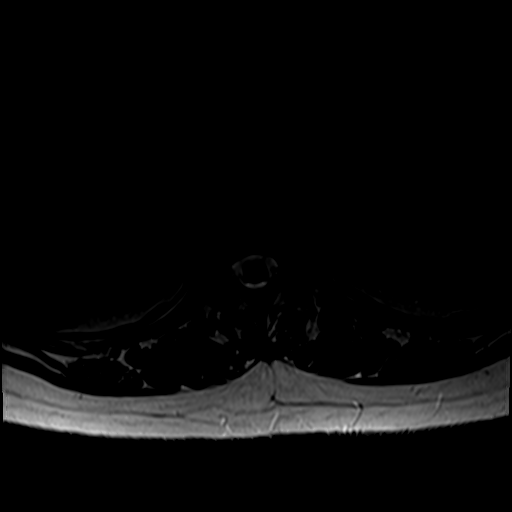

[Series 7: T1 · axial · 4.0mm · 0.39mm/px · z∈[-42,+125]mm · 5 of 37 slices shown (2 of 2)]
[im 1/37]
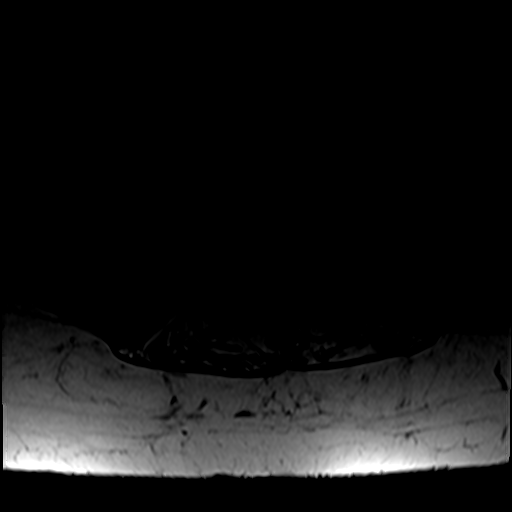
[im 6/37]
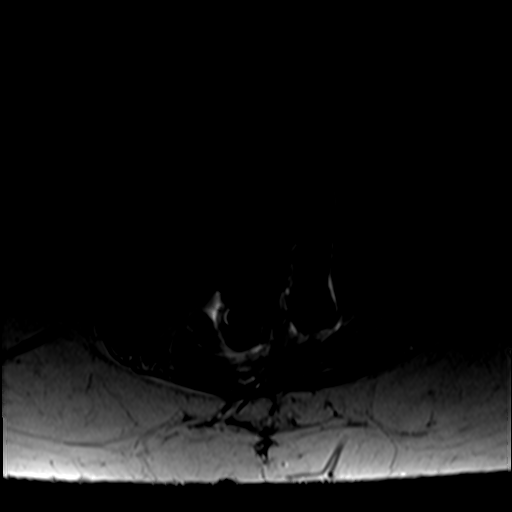
[im 12/37]
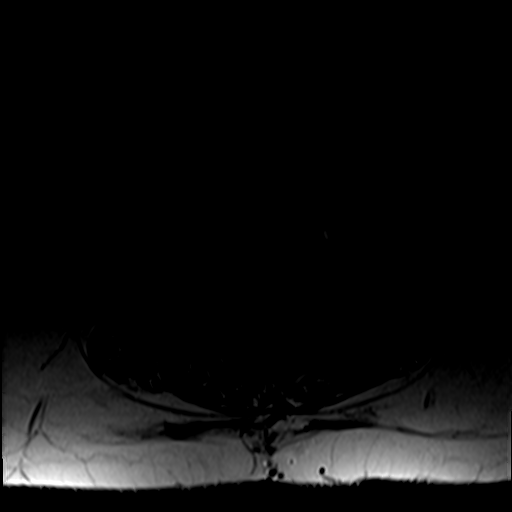
[im 20/37]
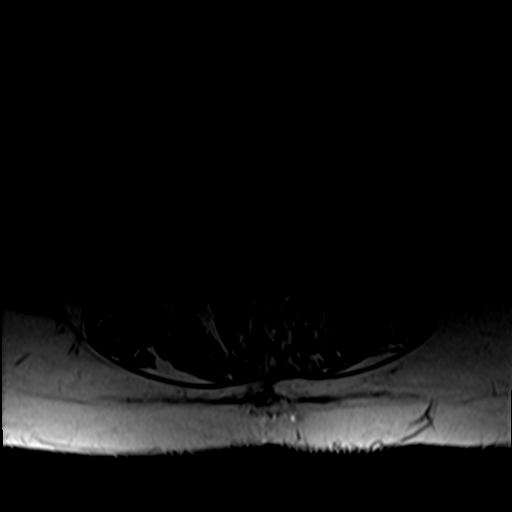
[im 31/37]
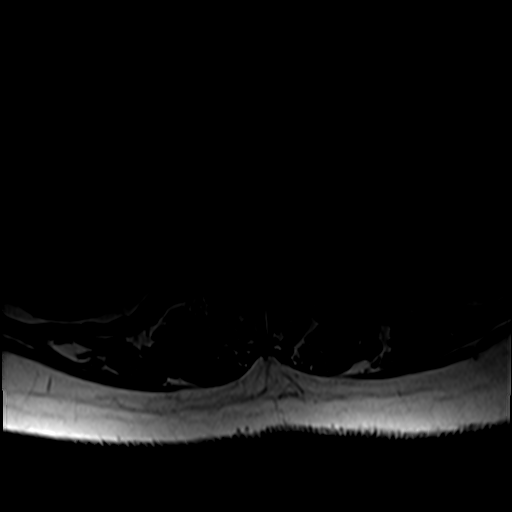

[26 of 48 positions shown; findings below may reference images not displayed]

FINDINGS: Segmentation: Standard; the lowest formed disc space is designated
L5-S1

Alignment: There is trace retrolisthesis of L1 on L2 and L2 on L3, 4
mm grade 1 retrolisthesis of L3 on L4, 4 mm grade 1 anterolisthesis
of L4 on L5, and trace retrolisthesis of L5 on S1, likely
degenerative in nature and not significantly changed since 8880.
Alignment at the other levels is normal.

Vertebrae: Postsurgical changes reflecting posterior instrumented
fusion and posterior decompression at L4-L5 are noted. No definite
osseous fusion across the vertebral bodies is seen. Foci of T1 and
T2 hyperintensity in the T12, L1, and L3 vertebral bodies are
unchanged, likely reflecting intraosseous hemangiomas. There is
degenerative endplate marrow signal abnormality at T12-L1 and L2-L3.
There is no suspicious marrow signal abnormality.

Conus medullaris and cauda equina: Conus extends to the L1-L2 level.
Conus and cauda equina appear normal.

Paraspinal and other soft tissues: Unremarkable.

Disc levels:

There is multilevel disc desiccation and narrowing, most advanced at
L2-L3. There is multilevel facet arthropathy, most advanced at L1-L2
and L2-L3. Small facet joint effusions are noted on the left at
T12-L1 and bilaterally at L1-L2.

T12-L1: There is a mild disc bulge and moderate bilateral facet
arthropathy resulting in mild spinal canal stenosis and mild
bilateral neural foraminal stenosis

L1-L2: There is a diffuse disc bulge, degenerative endplate change,
and bulky bilateral facet arthropathy resulting in moderate to
severe spinal canal stenosis with narrowing of the subarticular
zones, left worse than right, and possible impingement of the
traversing nerve roots on either side, and moderate left and mild
right neural foraminal stenosis. Findings are progressed since 8880

L2-L3: There is a mild disc bulge, degenerative endplate change, and
moderate bilateral facet arthropathy resulting in moderate spinal
canal stenosis without definite evidence of nerve root impingement,
and no significant neural foraminal stenosis. The spinal canal
stenosis is slightly worsened since 8880.

L3-L4: There is grade 1 retrolisthesis with a mild superimposed
bulge, prominent dorsal epidural fat, and bulky bilateral facet
arthropathy resulting in severe spinal canal stenosis with
compression of the cauda equina nerve roots and mild bilateral
neural foraminal stenosis. Findings at this level have slightly
progressed since 8880.

L4-L5: Status post posterior decompression and fusion. There is
grade 1 anterolisthesis with a mild superimposed bulge without
significant spinal canal or neural foraminal stenosis

L5-S1: There is a mild disc bulge with bilateral foraminal
components and bilateral facet arthropathy resulting in mild
narrowing of the subarticular zones without evidence of nerve root
impingement and severe right worse than left neural foraminal
stenosis.
IMPRESSION: 1. Status post posterior decompression and fusion at L4-L5 without
residual spinal canal or neural foraminal stenosis. There is
unchanged grade 1 anterolisthesis at this level compared to the
preoperative study from 8880.
2. Adjacent segment disease at L3-L4 with grade 1 retrolisthesis
resulting in severe spinal canal stenosis with compression of the
cauda equina nerve roots and mild bilateral neural foraminal
stenosis, slightly progressed since [DATE]. Moderate to severe spinal canal stenosis at L1-L2 with narrowing
of the subarticular zones and possible impingement of the traversing
nerve roots on either side, and moderate left neural foraminal
stenosis. Findings at this level have progressed since [DATE]. Moderate spinal canal stenosis at L2-L3 without evidence of nerve
root impingement, slightly worsened since [DATE]. Severe right worse than left neural foraminal stenosis at L5-S1.
6. Advanced facet arthropathy at L1-L2 and L2-L3, worst at L1-L2.

## 2023-06-28 DIAGNOSIS — Z1231 Encounter for screening mammogram for malignant neoplasm of breast: Secondary | ICD-10-CM | POA: Diagnosis not present

## 2023-07-18 ENCOUNTER — Other Ambulatory Visit: Payer: Self-pay

## 2023-07-18 DIAGNOSIS — R0602 Shortness of breath: Secondary | ICD-10-CM

## 2023-07-18 DIAGNOSIS — E782 Mixed hyperlipidemia: Secondary | ICD-10-CM

## 2023-07-28 ENCOUNTER — Ambulatory Visit (HOSPITAL_COMMUNITY)

## 2023-08-07 ENCOUNTER — Telehealth (HOSPITAL_COMMUNITY): Payer: Self-pay | Admitting: *Deleted

## 2023-08-07 NOTE — Telephone Encounter (Signed)
 Patient returning call about her upcoming cardiac imaging study; pt verbalizes understanding of appt date/time, parking situation and where to check in, pre-test NPO status and medications ordered, and verified current allergies; name and call back number provided for further questions should they arise  Denise Requena RN Navigator Cardiac Imaging [name] [phone] office [phone] cell  Patient reports she has 50mg  metoprolol tartrate and was instructed by her doctor to take a tablet the night before and morning of her test.

## 2023-08-07 NOTE — Telephone Encounter (Signed)
 Attempted to call patient regarding upcoming cardiac CT appointment. Left message on voicemail with name and callback number  Larey Brick RN Navigator Cardiac Imaging [facility] [phone] Office [phone] Cell

## 2023-08-08 ENCOUNTER — Ambulatory Visit (HOSPITAL_COMMUNITY)
Admission: RE | Admit: 2023-08-08 | Discharge: 2023-08-08 | Disposition: A | Discharge status: Home / Self Care | Source: Ambulatory Visit

## 2023-08-08 DIAGNOSIS — I2510 Atherosclerotic heart disease of native coronary artery without angina pectoris: Secondary | ICD-10-CM | POA: Diagnosis not present

## 2023-08-08 DIAGNOSIS — R0602 Shortness of breath: Secondary | ICD-10-CM | POA: Insufficient documentation

## 2023-08-08 DIAGNOSIS — E782 Mixed hyperlipidemia: Secondary | ICD-10-CM | POA: Insufficient documentation

## 2023-08-08 DIAGNOSIS — R0609 Other forms of dyspnea: Secondary | ICD-10-CM | POA: Diagnosis not present

## 2023-08-08 MED ORDER — NITROGLYCERIN 0.4 MG SL SUBL: 0.8000 mg | SUBLINGUAL_TABLET | Freq: Once | SUBLINGUAL | Status: DC

## 2023-08-08 MED ORDER — IOHEXOL 350 MG/ML SOLN
100.0000 mL | Freq: Once | INTRAVENOUS | Status: AC | PRN
  Administered 2023-08-08: 100 mL via INTRAVENOUS

## 2023-09-08 DIAGNOSIS — H53143 Visual discomfort, bilateral: Secondary | ICD-10-CM | POA: Diagnosis not present

## 2023-09-08 DIAGNOSIS — H2513 Age-related nuclear cataract, bilateral: Secondary | ICD-10-CM | POA: Diagnosis not present

## 2023-09-08 DIAGNOSIS — H353132 Nonexudative age-related macular degeneration, bilateral, intermediate dry stage: Secondary | ICD-10-CM | POA: Diagnosis not present

## 2023-10-22 DIAGNOSIS — Z23 Encounter for immunization: Secondary | ICD-10-CM | POA: Diagnosis not present

## 2024-01-01 DIAGNOSIS — H906 Mixed conductive and sensorineural hearing loss, bilateral: Secondary | ICD-10-CM | POA: Diagnosis not present

## 2024-01-01 DIAGNOSIS — H6122 Impacted cerumen, left ear: Secondary | ICD-10-CM | POA: Diagnosis not present

## 2024-01-01 DIAGNOSIS — H6521 Chronic serous otitis media, right ear: Secondary | ICD-10-CM | POA: Diagnosis not present
